# Patient Record
Sex: Female | Born: 1950
Health system: Southern US, Community
[De-identification: ages and names within clinical notes are randomized; demographics above are authoritative.]

## PROBLEM LIST (undated history)

## (undated) DIAGNOSIS — J439 Emphysema, unspecified: Secondary | ICD-10-CM

## (undated) DIAGNOSIS — R519 Headache, unspecified: Secondary | ICD-10-CM

## (undated) DIAGNOSIS — I219 Acute myocardial infarction, unspecified: Secondary | ICD-10-CM

## (undated) DIAGNOSIS — J984 Other disorders of lung: Secondary | ICD-10-CM

## (undated) DIAGNOSIS — G709 Myoneural disorder, unspecified: Secondary | ICD-10-CM

## (undated) DIAGNOSIS — K219 Gastro-esophageal reflux disease without esophagitis: Secondary | ICD-10-CM

## (undated) DIAGNOSIS — M1990 Unspecified osteoarthritis, unspecified site: Secondary | ICD-10-CM

## (undated) DIAGNOSIS — M479 Spondylosis, unspecified: Secondary | ICD-10-CM

## (undated) DIAGNOSIS — R05 Cough: Secondary | ICD-10-CM

## (undated) DIAGNOSIS — Z8739 Personal history of other diseases of the musculoskeletal system and connective tissue: Secondary | ICD-10-CM

## (undated) DIAGNOSIS — R0902 Hypoxemia: Secondary | ICD-10-CM

## (undated) DIAGNOSIS — I2510 Atherosclerotic heart disease of native coronary artery without angina pectoris: Secondary | ICD-10-CM

## (undated) DIAGNOSIS — H269 Unspecified cataract: Secondary | ICD-10-CM

## (undated) DIAGNOSIS — G4730 Sleep apnea, unspecified: Secondary | ICD-10-CM

## (undated) DIAGNOSIS — M810 Age-related osteoporosis without current pathological fracture: Secondary | ICD-10-CM

## (undated) DIAGNOSIS — I4719 Other supraventricular tachycardia: Secondary | ICD-10-CM

## (undated) DIAGNOSIS — R51 Headache: Secondary | ICD-10-CM

## (undated) DIAGNOSIS — I499 Cardiac arrhythmia, unspecified: Secondary | ICD-10-CM

## (undated) DIAGNOSIS — M5030 Other cervical disc degeneration, unspecified cervical region: Secondary | ICD-10-CM

## (undated) DIAGNOSIS — I471 Supraventricular tachycardia: Secondary | ICD-10-CM

## (undated) DIAGNOSIS — R059 Cough, unspecified: Secondary | ICD-10-CM

## (undated) DIAGNOSIS — F410 Panic disorder [episodic paroxysmal anxiety] without agoraphobia: Secondary | ICD-10-CM

## (undated) DIAGNOSIS — J449 Chronic obstructive pulmonary disease, unspecified: Secondary | ICD-10-CM

## (undated) DIAGNOSIS — IMO0001 Reserved for inherently not codable concepts without codable children: Secondary | ICD-10-CM

## (undated) DIAGNOSIS — R7303 Prediabetes: Secondary | ICD-10-CM

## (undated) HISTORY — PX: SPINE SURGERY: SHX786

## (undated) HISTORY — PX: TUBAL LIGATION: SHX77

## (undated) HISTORY — DX: Unspecified osteoarthritis, unspecified site: M19.90

## (undated) HISTORY — DX: Hypoxemia: R09.02

## (undated) HISTORY — PX: TONSILLECTOMY: SUR1361

## (undated) HISTORY — DX: Myoneural disorder, unspecified: G70.9

## (undated) HISTORY — DX: Other supraventricular tachycardia: I47.19

## (undated) HISTORY — DX: Unspecified cataract: H26.9

## (undated) HISTORY — DX: Supraventricular tachycardia: I47.1

## (undated) HISTORY — PX: EYE SURGERY: SHX253

## (undated) HISTORY — DX: Atherosclerotic heart disease of native coronary artery without angina pectoris: I25.10

## (undated) HISTORY — DX: Chronic obstructive pulmonary disease, unspecified: J44.9

---

## 1988-12-26 HISTORY — PX: BREAST ENHANCEMENT SURGERY: SHX7

## 1993-12-26 HISTORY — PX: CARDIAC CATHETERIZATION: SHX172

## 1998-09-28 ENCOUNTER — Inpatient Hospital Stay (HOSPITAL_COMMUNITY): Admission: EM | Admit: 1998-09-28 | Discharge: 1998-09-29 | Payer: Self-pay | Admitting: Cardiovascular Disease

## 2002-10-01 ENCOUNTER — Emergency Department (HOSPITAL_COMMUNITY): Admission: EM | Admit: 2002-10-01 | Discharge: 2002-10-01 | Payer: Self-pay | Admitting: Emergency Medicine

## 2005-05-30 ENCOUNTER — Ambulatory Visit (HOSPITAL_COMMUNITY): Admission: RE | Admit: 2005-05-30 | Discharge: 2005-05-30 | Payer: Self-pay | Admitting: Family Medicine

## 2005-06-20 ENCOUNTER — Emergency Department (HOSPITAL_COMMUNITY): Admission: EM | Admit: 2005-06-20 | Discharge: 2005-06-20 | Payer: Self-pay | Admitting: Emergency Medicine

## 2005-07-13 ENCOUNTER — Ambulatory Visit (HOSPITAL_COMMUNITY): Admission: RE | Admit: 2005-07-13 | Discharge: 2005-07-13 | Payer: Self-pay | Admitting: *Deleted

## 2011-09-22 ENCOUNTER — Encounter: Payer: Self-pay | Admitting: Pulmonary Disease

## 2011-09-24 ENCOUNTER — Encounter (HOSPITAL_COMMUNITY): Payer: Self-pay | Admitting: *Deleted

## 2011-09-24 ENCOUNTER — Other Ambulatory Visit: Payer: Self-pay

## 2011-09-24 ENCOUNTER — Emergency Department (HOSPITAL_COMMUNITY)
Admission: EM | Admit: 2011-09-24 | Discharge: 2011-09-24 | Disposition: A | Discharge status: Home / Self Care | Payer: Medicaid Other | Attending: Emergency Medicine | Admitting: Emergency Medicine

## 2011-09-24 DIAGNOSIS — F17200 Nicotine dependence, unspecified, uncomplicated: Secondary | ICD-10-CM | POA: Insufficient documentation

## 2011-09-24 DIAGNOSIS — T7840XA Allergy, unspecified, initial encounter: Secondary | ICD-10-CM

## 2011-09-24 DIAGNOSIS — J4489 Other specified chronic obstructive pulmonary disease: Secondary | ICD-10-CM | POA: Insufficient documentation

## 2011-09-24 DIAGNOSIS — R21 Rash and other nonspecific skin eruption: Secondary | ICD-10-CM | POA: Insufficient documentation

## 2011-09-24 DIAGNOSIS — J449 Chronic obstructive pulmonary disease, unspecified: Secondary | ICD-10-CM | POA: Insufficient documentation

## 2011-09-24 DIAGNOSIS — T783XXA Angioneurotic edema, initial encounter: Secondary | ICD-10-CM | POA: Insufficient documentation

## 2011-09-24 DIAGNOSIS — R220 Localized swelling, mass and lump, head: Secondary | ICD-10-CM | POA: Insufficient documentation

## 2011-09-24 DIAGNOSIS — R079 Chest pain, unspecified: Secondary | ICD-10-CM | POA: Insufficient documentation

## 2011-09-24 DIAGNOSIS — R221 Localized swelling, mass and lump, neck: Secondary | ICD-10-CM | POA: Insufficient documentation

## 2011-09-24 DIAGNOSIS — T486X5A Adverse effect of antiasthmatics, initial encounter: Secondary | ICD-10-CM | POA: Insufficient documentation

## 2011-09-24 MED ORDER — DIPHENHYDRAMINE HCL 50 MG/ML IJ SOLN
INTRAMUSCULAR | Status: AC
  Administered 2011-09-24: 25 mg
  Filled 2011-09-24: qty 1

## 2011-09-24 MED ORDER — FAMOTIDINE IN NACL 20-0.9 MG/50ML-% IV SOLN
INTRAVENOUS | Status: AC
  Administered 2011-09-24: 20 mg
  Filled 2011-09-24: qty 50

## 2011-09-24 MED ORDER — METHYLPREDNISOLONE SODIUM SUCC 125 MG IJ SOLR
INTRAMUSCULAR | Status: AC
  Filled 2011-09-24: qty 2

## 2011-09-24 NOTE — ED Provider Notes (Addendum)
History     CSN: [id number] Arrival date & time: 09/24/2011  4:22 AM  Chief Complaint  Patient presents with  . Shortness of Breath  . Rash  . Oral Swelling  . Chest Pain    (Consider location/radiation/quality/duration/timing/severity/associated sxs/prior treatment) HPI Comments: Seen 0459.   Patient is a 60 y.o. female presenting with allergic reaction. The history is provided by the patient and a relative.  Allergic Reaction The primary symptoms are  shortness of breath, rash and angioedema. Primary symptoms comment: Per patient she developed tongue swelling at 6:30 PM yesterday. Took benadryl. Woke at 2 AM with both tongue swelling and itching and rash to hands which spread to entire body. Took additional benadryl. The current episode started 6 to 12 hours ago. The problem has been gradually improving. This is a new problem.  The rash is associated with itching.  The angioedema began 6 to 12 hours ago. The angioedema has been gradually improving since its onset. It is located on the tongue. The angioedema is associated with shortness of breath.  Associated with: no known source. Started a new albuterol inhaler however has been taking albuterol for years. Significant symptoms also include itching.    Past Medical History  Diagnosis Date  . COPD (chronic obstructive pulmonary disease)     Past Surgical History  Procedure Date  . Tonsillectomy   . Tonsillectomy     History reviewed. No pertinent family history.  History  Substance Use Topics  . Smoking status: Current Everyday Smoker  . Smokeless tobacco: Not on file  . Alcohol Use: No    OB History    Grav Para Term Preterm Abortions TAB SAB Ect Mult Living                  Review of Systems  HENT:       Tongue swelling  Respiratory: Positive for shortness of breath.   Skin: Positive for itching and rash.  All other systems reviewed and are negative.    Allergies  Prednisone  Home Medications    Current Outpatient Rx  Name Route Sig Dispense Refill  . ALBUTEROL SULFATE HFA 108 (90 BASE) MCG/ACT IN AERS Inhalation Inhale 2 puffs into the lungs every 6 (six) hours as needed.        BP 105/63  Pulse 108  Temp(Src) 97.7 F (36.5 C) (Oral)  SpO2 93%  Physical Exam  Nursing note and vitals reviewed. Constitutional: She is oriented to person, place, and time. She appears well-developed and well-nourished. No distress.  HENT:  Head: Atraumatic.  Right Ear: External ear normal.  Left Ear: External ear normal.  Nose: Nose normal.  Mouth/Throat: Oropharynx is clear and moist.       Tongue is normal size  Eyes: EOM are normal. Pupils are equal, round, and reactive to light.  Neck: Normal range of motion. Neck supple.       No stidor  Cardiovascular: Normal rate, normal heart sounds and intact distal pulses.   Pulmonary/Chest: Effort normal. She has wheezes.       Occasional end expiratory wheeze  Abdominal: Soft. Bowel sounds are normal.  Musculoskeletal: Normal range of motion.  Neurological: She is alert and oriented to person, place, and time. She has normal reflexes.  Skin: Skin is warm and dry.    ED Course  CRITICAL CARE Performed by: Annamarie Dawley Authorized by: Annamarie Dawley Critical care was necessary to treat or prevent imminent or life-threatening deterioration of the following  conditions: respiratory failure. Critical care was time spent personally by me on the following activities: development of treatment plan with patient or surrogate, evaluation of patient's response to treatment, examination of patient, obtaining history from patient or surrogate, ordering and performing treatments and interventions, pulse oximetry and re-evaluation of patient's condition.   Critical Care time 40 minutes   Date: 09/24/2011  0433  Rate: 101  Rhythm: sinus tachycardia  QRS Axis: normal  Intervals: normal  ST/T Wave abnormalities: normal  Conduction  Disutrbances:none  Narrative Interpretation:   Old EKG Reviewed: unchanged from 09/28/98, anterior infarct noted at that time  Patient with onset of angioedema and no known exposures. Developed tongue swelling and rash to hands and feet. Took benadryl at home with some relief.Was given both benadryl and pepcid in the ER with resolution of both swelling and rash. Patient is allergic to prednisone. Discussed with patient need to return to the ER if develops renewed angioedema. Observed, given IVF, took PO fluids. No difficulty swallowing. Pt feels improved after observation and/or treatment in ED. MDM Reviewed: nursing note and vitals Total time providing critical care: 40.           Nicoletta Dress. Colon Branch, MD 09/24/11 1610  Nicoletta Dress. Colon Branch, MD 10/02/11 2047  Nicoletta Dress. Colon Branch, MD 02/11/12 [phone]

## 2011-09-24 NOTE — ED Notes (Signed)
Pt states she felt like her tongue was swelling yesterday and took a benadryl. Pt states she woke up tonight with a red rash all over her body, tongue swelling, sob, and chest pain.

## 2011-09-28 ENCOUNTER — Ambulatory Visit (INDEPENDENT_AMBULATORY_CARE_PROVIDER_SITE_OTHER): Payer: Medicaid Other | Admitting: Pulmonary Disease

## 2011-09-28 ENCOUNTER — Encounter: Payer: Self-pay | Admitting: Pulmonary Disease

## 2011-09-28 VITALS — BP 102/66 | HR 79 | Temp 98.0°F | Ht 62.0 in | Wt 127.8 lb

## 2011-09-28 DIAGNOSIS — J449 Chronic obstructive pulmonary disease, unspecified: Secondary | ICD-10-CM

## 2011-09-28 DIAGNOSIS — R0609 Other forms of dyspnea: Secondary | ICD-10-CM | POA: Insufficient documentation

## 2011-09-28 NOTE — Patient Instructions (Signed)
Spiriva one inhalation each am  symbicort 160/4.5  2 inhalations am and pm everyday.  Keep mouth rinsed well.  Can use albuterol for rescue only You must stop smoking if you want to get better.  followup with me in 6weeks.

## 2011-09-28 NOTE — Assessment & Plan Note (Signed)
The patient has severe air flow obstruction on her spirometry today, but it is unclear how much of this is fixed versus potentially reversible if she was to quit smoking and get on adequate bronchodilator regimen.  I've had a long discussion with the patient about COPD, and how she must quit smoking if she wishes to improve.  I would like to start her on an aggressive bronchodilator regimen, and see how she responds.

## 2011-09-28 NOTE — Progress Notes (Signed)
  Subjective:    Patient ID: Taylor Burnett, female    DOB: 09-Feb-1951, 60 y.o.   MRN: [id number]  HPI The patient is a 60 year old female who I have been asked to see for dyspnea on exertion.  Patient states she has had shortness of breath for years, but believes that it is getting worse.  She describes a less than one block dyspnea on exertion at a moderate pace on flat ground.  She will also get winded bringing groceries in from the car, or doing her housework.  The patient has a long history of smoking, and unfortunately continues to do so.  She has apparently had an evaluation at the disability office which includes a chest x-ray and spirometry, however these results are not available to [place].  The patient has intermittent cough, and produces white foamy mucus primarily in the mornings.  She denies significant lower extremity edema.  She has been tried on Advair in the distant past, and felt she could not tolerate.  She has not been on any kind of maintenance medications due to the cost.   Review of Systems  Constitutional: Negative for fever and unexpected weight change.  HENT: Positive for congestion, sore throat and trouble swallowing. Negative for ear pain, nosebleeds, rhinorrhea, sneezing, dental problem, postnasal drip and sinus pressure.   Eyes: Negative for redness and itching.  Respiratory: Positive for cough and shortness of breath. Negative for chest tightness and wheezing.   Cardiovascular: Positive for chest pain and leg swelling. Negative for palpitations.  Gastrointestinal: Negative for nausea and vomiting.  Genitourinary: Negative for dysuria.  Musculoskeletal: Positive for joint swelling.  Skin: Positive for rash.  Neurological: Negative for headaches.  Hematological: Does not bruise/bleed easily.  Psychiatric/Behavioral: Positive for dysphoric mood. The patient is nervous/anxious.        Objective:   Physical Exam Constitutional:  Well developed, no acute  distress  HENT:  Nares patent without discharge, but narrowed.  Oropharynx without exudate, palate and uvula are normal  Eyes:  Perrla, eomi, no scleral icterus  Neck:  No JVD, no TMG  Cardiovascular:  Normal rate, regular rhythm, no rubs or gallops.  No murmurs        Intact distal pulses  Pulmonary :  Normal breath sounds, no stridor or respiratory distress   No rales, rhonchi, or wheezing  Abdominal:  Soft, nondistended, bowel sounds present.  No tenderness noted.   Musculoskeletal:  No lower extremity edema noted.  Lymph Nodes:  No cervical lymphadenopathy noted  Skin:  No cyanosis noted  Neurologic:  Alert, appropriate, moves all 4 extremities without obvious deficit.         Assessment & Plan:

## 2011-11-09 ENCOUNTER — Ambulatory Visit: Payer: Medicaid Other | Admitting: Pulmonary Disease

## 2011-11-14 ENCOUNTER — Ambulatory Visit (INDEPENDENT_AMBULATORY_CARE_PROVIDER_SITE_OTHER): Payer: Medicaid Other | Admitting: Pulmonary Disease

## 2011-11-14 ENCOUNTER — Encounter: Payer: Self-pay | Admitting: Pulmonary Disease

## 2011-11-14 VITALS — BP 112/70 | HR 80 | Temp 97.8°F | Ht 62.0 in | Wt 130.6 lb

## 2011-11-14 DIAGNOSIS — J449 Chronic obstructive pulmonary disease, unspecified: Secondary | ICD-10-CM

## 2011-11-14 MED ORDER — BUDESONIDE-FORMOTEROL FUMARATE 160-4.5 MCG/ACT IN AERO: 2.0000 | INHALATION_SPRAY | Freq: Two times a day (BID) | RESPIRATORY_TRACT | Status: DC

## 2011-11-14 NOTE — Progress Notes (Signed)
Addended by: Salli Quarry on: 11/14/2011 04:18 PM   Modules accepted: Orders

## 2011-11-14 NOTE — Patient Instructions (Signed)
Stay on symbicort 2 puffs am and pm.  Keep mouth rinsed well. Stop smoking.  This is the key to you getting better. followup with me in 6mos.

## 2011-11-14 NOTE — Progress Notes (Signed)
  Subjective:    Patient ID: Taylor Burnett, female    DOB: March 13, 1951, 60 y.o.   MRN: [id number]  HPI The patient comes in today for followup of her known severe obstructive lung disease.  Some of this is felt to be reversible from airway inflammation associated with ongoing smoking.  At the last visit, the patient was started on Spiriva as well as symbicort, and asked to followup today.  She was unable to stay on Spiriva due to "mouth burning", and although she can stay on the symbicort, she feels this makes her anxious.  She has seen some improvement in her breathing since being on these medications.  Unfortunately she continues to smoke.   Review of Systems  Constitutional: Negative for fever and unexpected weight change.  HENT: Negative for ear pain, nosebleeds, congestion, sore throat, rhinorrhea, sneezing, trouble swallowing, dental problem, postnasal drip and sinus pressure.   Eyes: Positive for itching. Negative for redness.  Respiratory: Positive for cough, chest tightness, shortness of breath and wheezing.   Cardiovascular: Positive for palpitations. Negative for leg swelling.  Gastrointestinal: Negative for nausea and vomiting.  Genitourinary: Negative for dysuria.  Musculoskeletal: Negative for joint swelling.  Skin: Negative for rash.  Neurological: Negative for headaches.  Hematological: Does not bruise/bleed easily.  Psychiatric/Behavioral: Positive for dysphoric mood. The patient is nervous/anxious.        Objective:   Physical Exam Well-developed female in no acute distress Nose without purulence or discharge noted Chest with decreased breath sounds, but no wheezes or rhonchi Heart regular rate and rhythm Lower extremities without edema, no cyanosis noted Alert and oriented, moves all 4 extremities.       Assessment & Plan:

## 2011-11-14 NOTE — Assessment & Plan Note (Signed)
The pt feels she cannot tolerate spiriva due to side effects, but can live with the inconvenience of symbicort.  I have asked her to continue on this medication, and most importantly needs to stop smoking.  She will followup with me in 6mos, but call if having increased breathing issues.

## 2011-12-19 ENCOUNTER — Inpatient Hospital Stay (HOSPITAL_COMMUNITY)
Admission: EM | Admit: 2011-12-19 | Discharge: 2011-12-23 | DRG: 153 | Disposition: A | Discharge status: Home / Self Care | Payer: Medicaid Other | Attending: Internal Medicine | Admitting: Internal Medicine

## 2011-12-19 ENCOUNTER — Encounter (HOSPITAL_COMMUNITY): Payer: Self-pay | Admitting: *Deleted

## 2011-12-19 ENCOUNTER — Emergency Department (HOSPITAL_COMMUNITY): Payer: Medicaid Other

## 2011-12-19 ENCOUNTER — Telehealth: Payer: Self-pay | Admitting: Pulmonary Disease

## 2011-12-19 DIAGNOSIS — J441 Chronic obstructive pulmonary disease with (acute) exacerbation: Secondary | ICD-10-CM | POA: Diagnosis present

## 2011-12-19 DIAGNOSIS — R0902 Hypoxemia: Secondary | ICD-10-CM | POA: Diagnosis present

## 2011-12-19 DIAGNOSIS — F17200 Nicotine dependence, unspecified, uncomplicated: Secondary | ICD-10-CM | POA: Diagnosis present

## 2011-12-19 DIAGNOSIS — Z7982 Long term (current) use of aspirin: Secondary | ICD-10-CM

## 2011-12-19 DIAGNOSIS — J111 Influenza due to unidentified influenza virus with other respiratory manifestations: Principal | ICD-10-CM | POA: Diagnosis present

## 2011-12-19 DIAGNOSIS — J449 Chronic obstructive pulmonary disease, unspecified: Secondary | ICD-10-CM | POA: Diagnosis present

## 2011-12-19 DIAGNOSIS — D696 Thrombocytopenia, unspecified: Secondary | ICD-10-CM

## 2011-12-19 DIAGNOSIS — D709 Neutropenia, unspecified: Secondary | ICD-10-CM | POA: Diagnosis not present

## 2011-12-19 DIAGNOSIS — R197 Diarrhea, unspecified: Secondary | ICD-10-CM | POA: Diagnosis present

## 2011-12-19 LAB — BASIC METABOLIC PANEL
Creatinine, Ser: 0.8 mg/dL (ref 0.50–1.10)
GFR calc Af Amer: >90 mL/min (ref >90)
Glucose, Bld: 129 mg/dL — ABNORMAL HIGH (ref 70–99)
Potassium: 3.8 mEq/L (ref 3.5–5.1)
Sodium: 134 mEq/L — ABNORMAL LOW (ref 135–145)

## 2011-12-19 LAB — DIFFERENTIAL
Basophils Relative: 0 % (ref 0–1)
Eosinophils Absolute: 0 10*3/uL (ref 0.0–0.7)
Eosinophils Relative: 0 % (ref 0–5)
Lymphocytes Relative: 19 % (ref 12–46)
Lymphs Abs: 1.3 10*3/uL (ref 0.7–4.0)
Monocytes Relative: 8 % (ref 3–12)

## 2011-12-19 LAB — CBC
MCHC: 33.9 g/dL (ref 30.0–36.0)
RDW: 13 % (ref 11.5–15.5)
WBC: 6.7 10*3/uL (ref 4.0–10.5)

## 2011-12-19 MED ORDER — SODIUM CHLORIDE 0.9 % IV SOLN
INTRAVENOUS | Status: DC
  Administered 2011-12-19: 22:00:00 via INTRAVENOUS

## 2011-12-19 MED ORDER — IPRATROPIUM BROMIDE 0.02 % IN SOLN
1.0000 mg | Freq: Once | RESPIRATORY_TRACT | Status: AC
  Administered 2011-12-19: 1 mg via RESPIRATORY_TRACT
  Filled 2011-12-19: qty 2.5

## 2011-12-19 MED ORDER — IPRATROPIUM BROMIDE 0.02 % IN SOLN: 0.5000 mg | Freq: Once | RESPIRATORY_TRACT | Status: DC

## 2011-12-19 MED ORDER — ALBUTEROL SULFATE (5 MG/ML) 0.5% IN NEBU
5.0000 mg | INHALATION_SOLUTION | Freq: Once | RESPIRATORY_TRACT | Status: AC
  Administered 2011-12-19: 5 mg via RESPIRATORY_TRACT
  Filled 2011-12-19: qty 1

## 2011-12-19 MED ORDER — IPRATROPIUM BROMIDE 0.02 % IN SOLN: 1.0000 mg | Freq: Once | RESPIRATORY_TRACT | Status: DC

## 2011-12-19 MED ORDER — ACETAMINOPHEN 325 MG PO TABS
650.0000 mg | ORAL_TABLET | Freq: Once | ORAL | Status: AC
  Administered 2011-12-19: 650 mg via ORAL
  Filled 2011-12-19: qty 2

## 2011-12-19 MED ORDER — IPRATROPIUM BROMIDE 0.02 % IN SOLN
0.5000 mg | Freq: Once | RESPIRATORY_TRACT | Status: AC
  Administered 2011-12-19: 0.5 mg via RESPIRATORY_TRACT
  Filled 2011-12-19: qty 2.5

## 2011-12-19 MED ORDER — ALBUTEROL SULFATE (5 MG/ML) 0.5% IN NEBU: 10.0000 mg | INHALATION_SOLUTION | Freq: Once | RESPIRATORY_TRACT | Status: DC

## 2011-12-19 MED ORDER — ALBUTEROL (5 MG/ML) CONTINUOUS INHALATION SOLN
10.0000 mg/h | INHALATION_SOLUTION | Freq: Once | RESPIRATORY_TRACT | Status: AC
  Administered 2011-12-19: 10 mg/h via RESPIRATORY_TRACT
  Filled 2011-12-19 (×2): qty 20

## 2011-12-19 MED ORDER — SODIUM CHLORIDE 0.9 % IV BOLUS (SEPSIS)
1000.0000 mL | Freq: Once | INTRAVENOUS | Status: AC
  Administered 2011-12-19: 1000 mL via INTRAVENOUS

## 2011-12-19 NOTE — H&P (Signed)
PCP: None  Chief Complaint: Fever, chills, and shortness of breath.   HPI: Taylor Burnett is an 60 y.o. female with history of COPD, active tobacco smoker, presents to [facility] emergency room with two-day satiety of feeling malaise, chills, nonproductive cough, myalgia, abdominal cramps and diarrhea. Her daughter also had similar complaints. She denied any distant travel. Other than emphysema which she uses Symbicort regularly, she's not on any other medication. She said she has history of coronary disease, and prior MI, with prior negative cardiac catheterization. She did not have her flu shot this year. She to smoke about 5 cigarettes per day, but has had significant prior tobacco use of up to 2 packs per day.  In the emergency room, evaluation included a chest x-ray which showed no infiltrate. She had received several nebulizer treatments, but continued to be short of breath.  She also has slight hypoxemia with O2 sat of 87% requiring supplemental oxygen. Hospitalist was last asked to admit her for further treatment of her COPD exacerbation and likely influenza.  She also admitted to have an watery diarrhea for the past couple of days.  Rewiew of Systems:  The patient denies anorexia,, weight loss,, vision loss, decreased hearing, hoarseness, chest pain, syncope,  peripheral edema, balance deficits, hemoptysis, abdominal pain, melena, hematochezia, severe indigestion/heartburn, hematuria, incontinence, genital sores, muscle weakness, suspicious skin lesions, transient blindness, difficulty walking, depression, unusual weight change, abnormal bleeding, enlarged lymph nodes, angioedema, and breast masses   Past Medical History  Diagnosis Date  . COPD (chronic obstructive pulmonary disease)     Past Surgical History  Procedure Date  . Tonsillectomy   . Tubal ligation     Medications:  HOME MEDS: Prior to Admission medications   Medication Sig Start Date End Date Taking? Authorizing  Provider  acetaminophen (TYLENOL) 500 MG tablet Take 1,000 mg by mouth every 6 (six) hours as needed. For pain/fever.    Yes Historical Provider, MD  albuterol (VENTOLIN HFA) 108 (90 BASE) MCG/ACT inhaler Inhale 2 puffs into the lungs every 6 (six) hours as needed.     Yes Historical Provider, MD  aspirin EC 81 MG tablet Take 81 mg by mouth daily.     Yes Historical Provider, MD  budesonide-formoterol (SYMBICORT) 160-4.5 MCG/ACT inhaler Inhale 2 puffs into the lungs 2 (two) times daily. 11/14/11  Yes Barbaraann Share, MD  dextromethorphan (DELSYM) 30 MG/5ML liquid Take 60 mg by mouth 2 (two) times daily.     Yes Historical Provider, MD     Allergies:  Allergies  Allergen Reactions  . Prednisone     Depression and suicidal    Social History:   reports that she has been smoking Cigarettes.  She has a 80 pack-year smoking history. She does not have any smokeless tobacco history on file. She reports that she does not drink alcohol or use illicit drugs. 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 She lives alone, has 3 children and 4 grandchildren. Family History: Family History  Problem Relation Age of Onset  . Emphysema Father   . Allergies Mother   . Allergies Son   . Allergies Brother   . Heart disease Maternal Grandmother   . Rheum arthritis Brother   . Cancer      aunts and uncles     Physical Exam: Filed Vitals:   12/19/11 2045 12/19/11 2119 12/19/11 2245 12/19/11 2309  BP: 100/53 108/50 94/42 92/54   Pulse: 108 104 132 129  Temp: 101  F (38.3 C)  98.9 F (37.2 C)   TempSrc: Oral  Oral   Resp: 24  28 20   SpO2: 86% 100% 91% 96%   Blood pressure 92/54, pulse 129, temperature 98.9 F (37.2 C), temperature source Oral, resp. rate 20, SpO2 96.00%.  GEN:  Pleasant  person lying in the stretcher in no acute distress; cooperative with exam PSYCH:  alert and oriented x4; does not appear anxious does not appear depressed; affect is normal HEENT: Mucous membranes  pink and anicteric; PERRLA; EOM intact; no cervical lymphadenopathy nor thyromegaly or carotid bruit; no JVD; Breasts:: Not examined CHEST WALL: No tenderness CHEST: Normal respiration, she has scattered rhonchi throughout. There are  some inspiratory wheezes. HEART: Regular rate and rhythm; no murmurs rubs or gallops BACK: No kyphosis or scoliosis; no CVA tenderness ABDOMEN: Obese, soft non-tender; no masses, no organomegaly, normal abdominal bowel sounds; no pannus; no intertriginous candida. Rectal Exam: Not done EXTREMITIES: No bone or joint deformity; age-appropriate arthropathy of the hands and knees; no edema; no ulcerations. Genitalia: not examined PULSES: 2+ and symmetric SKIN: Normal hydration no rash or ulceration CNS: Cranial nerves 2-12 grossly intact no focal neurologic deficit   Labs & Imaging Results for orders placed during the hospital encounter of 12/19/11 (from the past 48 hour(s))  CBC     Status: Abnormal   Collection Time   12/19/11  8:45 PM      Component Value Range Comment   WBC 6.7  4.0 - 10.5 (K/uL)    RBC 4.86  3.87 - 5.11 (MIL/uL)    Hemoglobin 15.4 (*) 12.0 - 15.0 (g/dL)    HCT 14.7  82.9 - 56.2 (%)    MCV 93.4  78.0 - 100.0 (fL)    MCH 31.7  26.0 - 34.0 (pg)    MCHC 33.9  30.0 - 36.0 (g/dL)    RDW 13.0  86.5 - 78.4 (%)    Platelets 105 (*) 150 - 400 (K/uL) PLATELET COUNT CONFIRMED BY SMEAR  DIFFERENTIAL     Status: Normal   Collection Time   12/19/11  8:45 PM      Component Value Range Comment   Neutrophils Relative 73  43 - 77 (%)    Lymphocytes Relative 19  12 - 46 (%)    Monocytes Relative 8  3 - 12 (%)    Eosinophils Relative 0  0 - 5 (%)    Basophils Relative 0  0 - 1 (%)    Neutro Abs 4.9  1.7 - 7.7 (K/uL)    Lymphs Abs 1.3  0.7 - 4.0 (K/uL)    Monocytes Absolute 0.5  0.1 - 1.0 (K/uL)    Eosinophils Absolute 0.0  0.0 - 0.7 (K/uL)    Basophils Absolute 0.0  0.0 - 0.1 (K/uL)    Smear Review MORPHOLOGY UNREMARKABLE     BASIC METABOLIC  PANEL     Status: Abnormal   Collection Time   12/19/11  8:45 PM      Component Value Range Comment   Sodium 134 (*) 135 - 145 (mEq/L)    Potassium 3.8  3.5 - 5.1 (mEq/L)    Chloride 96  96 - 112 (mEq/L)    CO2 29  19 - 32 (mEq/L)    Glucose, Bld 129 (*) 70 - 99 (mg/dL)    BUN 7  6 - 23 (mg/dL)    Creatinine, Ser 6.96  0.50 - 1.10 (mg/dL)    Calcium 9.2  8.4 - 10.5 (mg/dL)  GFR calc non Af Amer 79 (*) >90 (mL/min)    GFR calc Af Amer >90  >90 (mL/min)    Dg Chest 2 View  12/19/2011  *RADIOLOGY REPORT*  Clinical Data: Shortness of breath, productive cough, fever  CHEST - 2 VIEW  Comparison: Chest x-ray of 06/20/2005  Findings: The lungs are clear but hyperaerated consistent with COPD.  No infiltrate or effusion is seen. Mediastinal contours appear normal.  The heart is within normal limits in size.  No acute bony abnormality is seen.  Calcification of the right breast implant is noted.  IMPRESSION:  COPD.  No active lung disease.  Original Report Authenticated By: Juline Patch, M.D.      Assessment Present on Admission:  .Influenza .COPD (chronic obstructive pulmonary disease) .Hypoxemia   PLAN:  She likely has the influenza virus. She also has severe COPD with exacerbation, now requiring supplemental O2. Will admit her to the general medical floor and give her 2 L of nasal cannula. I will test her for the influenza virus, and start her on Tamiflu 75 mg twice a day for 10 days. We'll treat her symptoms. She will need intravenous fluid also. She was not started on antibiotic, and thus I do not think that this is C. difficile. I did not order stool study, as the viral syndrome seems lately has included diarrhea as a symptom. If it persists, please send stool for studies. She is stable, full code, and will be admitted to [facility].   Other plans as per orders.   Kashara Blocher 12/19/2011, 11:38 PM

## 2011-12-19 NOTE — ED Provider Notes (Signed)
History     CSN: [id number]  Arrival date & time 12/19/11  1722   First MD Initiated Contact with Patient 12/19/11 1757      Chief Complaint  Patient presents with  . Influenza    (Consider location/radiation/quality/duration/timing/severity/associated sxs/prior treatment) HPI Comments: Patient reports that she has had sore throat, headache, body aches, increased productive cough, and fever for the past 2 days.  She has had an increased productive cough and increased shortness of breath.  She is unsure how high her fever has been because she does not have a thermometer.  PMH significant for COPD.  She reports that she currently smokes 5 cigarettes a day, but in the past has smoked 2ppd for 40 years.  She has also had diarrhea for 2-3 days.  She has had approximately 10 episodes of diarrhea per day.  No blood in her stool.  Her daughter has similar symptoms and has been diagnosed with Influenza.   Patient is a 60 y.o. female presenting with fever. The history is provided by the patient.  Fever Primary symptoms of the febrile illness include fever, fatigue, headaches, cough, shortness of breath, diarrhea and myalgias. Primary symptoms do not include wheezing, abdominal pain, nausea, vomiting, dysuria or rash. Episode onset: Three days ago. This is a new problem.  The headache is not associated with neck stiffness.    Past Medical History  Diagnosis Date  . COPD (chronic obstructive pulmonary disease)     Past Surgical History  Procedure Date  . Tonsillectomy   . Tubal ligation     Family History  Problem Relation Age of Onset  . Emphysema Father   . Allergies Mother   . Allergies Son   . Allergies Brother   . Heart disease Maternal Grandmother   . Rheum arthritis Brother   . Cancer      aunts and uncles    History  Substance Use Topics  . Smoking status: Current Everyday Smoker -- 2.0 packs/day for 40 years    Types: Cigarettes  . Smokeless tobacco: Not on file   Comment: currently smoking 5 cigs daily.  . Alcohol Use: No    OB History    Grav Para Term Preterm Abortions TAB SAB Ect Mult Living                  Review of Systems  Constitutional: Positive for fever, chills and fatigue.  HENT: Positive for sore throat and rhinorrhea. Negative for ear pain, congestion, neck pain, neck stiffness and sinus pressure.   Respiratory: Positive for cough and shortness of breath. Negative for chest tightness and wheezing.   Gastrointestinal: Positive for diarrhea. Negative for nausea, vomiting, abdominal pain, blood in stool and abdominal distention.  Genitourinary: Negative for dysuria.  Musculoskeletal: Positive for myalgias.  Skin: Negative for rash.  Neurological: Positive for headaches. Negative for dizziness, syncope and light-headedness.  Psychiatric/Behavioral: Negative for confusion.    Allergies  Prednisone  Home Medications   Current Outpatient Rx  Name Route Sig Dispense Refill  . ACETAMINOPHEN 500 MG PO TABS Oral Take 1,000 mg by mouth every 6 (six) hours as needed. For pain/fever.     . ALBUTEROL SULFATE HFA 108 (90 BASE) MCG/ACT IN AERS Inhalation Inhale 2 puffs into the lungs every 6 (six) hours as needed.      . ASPIRIN EC 81 MG PO TBEC Oral Take 81 mg by mouth daily.      . BUDESONIDE-FORMOTEROL FUMARATE 160-4.5 MCG/ACT IN AERO Inhalation Inhale  2 puffs into the lungs 2 (two) times daily. 1 Inhaler 6  . DEXTROMETHORPHAN POLISTIREX ER 30 MG/5ML PO LQCR Oral Take 60 mg by mouth 2 (two) times daily.        BP 131/61  Pulse 96  Temp(Src) 100.3 F (37.9 C) (Oral)  Resp 22  SpO2 89%  Physical Exam  Nursing note and vitals reviewed. Constitutional: She is oriented to person, place, and time. She appears well-developed and well-nourished. No distress.  HENT:  Head: Normocephalic and atraumatic.  Mouth/Throat: Oropharynx is clear and moist.  Eyes: EOM are normal. Pupils are equal, round, and reactive to light.  Neck: Normal  range of motion. Neck supple.  Cardiovascular: Normal rate, regular rhythm and normal heart sounds.   Pulmonary/Chest: Effort normal. She has decreased breath sounds. She has wheezes.       Diffuse expiratory wheezes  Abdominal: Soft. Bowel sounds are normal.  Neurological: She is alert and oriented to person, place, and time.  Skin: Skin is warm and dry. No rash noted. She is not diaphoretic.  Psychiatric: She has a normal mood and affect.    ED Course  Procedures (including critical care time)  Labs Reviewed - No data to display No results found.   No diagnosis found.  11:05 PM Discussed patient with [facility] .  Due to the fact that patient has new oxygen requirement and only minimal improvement with neb treatments, will admit patient to hospital.  MDM    See 1105 pm discussion      Doug Sou, MD 12/21/11 [phone]

## 2011-12-19 NOTE — ED Notes (Signed)
patietn placed on 2L of oxygen and saturation is up at 93%

## 2011-12-19 NOTE — ED Notes (Signed)
Breathing treatment is completed.

## 2011-12-19 NOTE — ED Provider Notes (Signed)
History     CSN: [id number]  Arrival date & time 12/19/11  1722   First MD Initiated Contact with Patient 12/19/11 1757      Chief Complaint  Patient presents with  . Influenza    (Consider location/radiation/quality/duration/timing/severity/associated sxs/prior treatment) HPI  Past Medical History  Diagnosis Date  . COPD (chronic obstructive pulmonary disease)     Past Surgical History  Procedure Date  . Tonsillectomy   . Tubal ligation     Family History  Problem Relation Age of Onset  . Emphysema Father   . Allergies Mother   . Allergies Son   . Allergies Brother   . Heart disease Maternal Grandmother   . Rheum arthritis Brother   . Cancer      aunts and uncles    History  Substance Use Topics  . Smoking status: Current Everyday Smoker -- 2.0 packs/day for 40 years    Types: Cigarettes  . Smokeless tobacco: Not on file   Comment: currently smoking 5 cigs daily.  . Alcohol Use: No    OB History    Grav Para Term Preterm Abortions TAB SAB Ect Mult Living                  Review of Systems  Allergies  Prednisone  Home Medications   Current Outpatient Rx  Name Route Sig Dispense Refill  . ACETAMINOPHEN 500 MG PO TABS Oral Take 1,000 mg by mouth every 6 (six) hours as needed. For pain/fever.     . ALBUTEROL SULFATE HFA 108 (90 BASE) MCG/ACT IN AERS Inhalation Inhale 2 puffs into the lungs every 6 (six) hours as needed.      . ASPIRIN EC 81 MG PO TBEC Oral Take 81 mg by mouth daily.      . BUDESONIDE-FORMOTEROL FUMARATE 160-4.5 MCG/ACT IN AERO Inhalation Inhale 2 puffs into the lungs 2 (two) times daily. 1 Inhaler 6  . DEXTROMETHORPHAN POLISTIREX ER 30 MG/5ML PO LQCR Oral Take 60 mg by mouth 2 (two) times daily.        BP 100/50  Pulse 94  Temp(Src) 100.2 F (37.9 C) (Oral)  Resp 18  SpO2 100%  Physical Exam  ED Course  Procedures (including critical care time)  Labs Reviewed - No data to display Dg Chest 2 View  12/19/2011   *RADIOLOGY REPORT*  Clinical Data: Shortness of breath, productive cough, fever  CHEST - 2 VIEW  Comparison: Chest x-ray of 06/20/2005  Findings: The lungs are clear but hyperaerated consistent with COPD.  No infiltrate or effusion is seen. Mediastinal contours appear normal.  The heart is within normal limits in size.  No acute bony abnormality is seen.  Calcification of the right breast implant is noted.  IMPRESSION:  COPD.  No active lung disease.  Original Report Authenticated By: Juline Patch, M.D.     No diagnosis found.    MDM  Duplicate note        Doug Sou, MD 12/21/11 [phone]

## 2011-12-19 NOTE — ED Notes (Signed)
Patient with flu like symptoms for about 3 days.  Patient also c/o diarrhea

## 2011-12-19 NOTE — ED Notes (Signed)
Family at bedside.daughter

## 2011-12-19 NOTE — Telephone Encounter (Signed)
Ms. Large daughter called.  Her mother has been running fever of 102F for past two days.  This has been associated with cough and increased shortness of breath.  She has been using her inhaler more and using delsym, but these don't help.  She has also been getting nauseous.  She has been trying to drink fluids, but is having persistent diarrhea and abdominal cramps.  I explained to the pt's daughter that her symptoms sound very severe, and that she needs to take her mother to the emergency room as soon as possible for further evaluation.

## 2011-12-19 NOTE — ED Provider Notes (Signed)
Complains of cough headache sore throat and diarrhea and shortness of breath for 3 days. Symptoms accompanied by fever. Seen by me after received one nebulized treatment. On exam mildly ill-appearing he describes dry oropharynx not red lungs expiratory wheezes , mildly prolonged expiratory phase speaks in paragraphs  Doug Sou, MD 12/19/11 2035

## 2011-12-19 NOTE — ED Notes (Signed)
Pt removed sweatshirt, sweat pants, coat, has on glasses. Purse at the bedside.

## 2011-12-20 LAB — BASIC METABOLIC PANEL
BUN: 7 mg/dL (ref 6–23)
CO2: 25 mEq/L (ref 19–32)
Chloride: 103 mEq/L (ref 96–112)
Creatinine, Ser: 0.7 mg/dL (ref 0.50–1.10)
GFR calc Af Amer: >90 mL/min (ref >90)
Glucose, Bld: 134 mg/dL — ABNORMAL HIGH (ref 70–99)
Potassium: 3.5 mEq/L (ref 3.5–5.1)

## 2011-12-20 LAB — CBC
HCT: 36.8 % (ref 36.0–46.0)
HCT: 37.1 % (ref 36.0–46.0)
Hemoglobin: 12.1 g/dL (ref 12.0–15.0)
Hemoglobin: 12.3 g/dL (ref 12.0–15.0)
MCH: 31 pg (ref 26.0–34.0)
MCHC: 32.9 g/dL (ref 30.0–36.0)
MCHC: 33.2 g/dL (ref 30.0–36.0)
MCV: 93.5 fL (ref 78.0–100.0)
MCV: 94.4 fL (ref 78.0–100.0)
RDW: 13.1 % (ref 11.5–15.5)

## 2011-12-20 MED ORDER — ONDANSETRON HCL 4 MG PO TABS: 4.0000 mg | ORAL_TABLET | Freq: Four times a day (QID) | ORAL | Status: DC | PRN

## 2011-12-20 MED ORDER — ONDANSETRON HCL 4 MG/2ML IJ SOLN: 4.0000 mg | Freq: Four times a day (QID) | INTRAMUSCULAR | Status: DC | PRN

## 2011-12-20 MED ORDER — ASPIRIN EC 81 MG PO TBEC
81.0000 mg | DELAYED_RELEASE_TABLET | Freq: Every day | ORAL | Status: DC
  Administered 2011-12-20 – 2011-12-23 (×4): 81 mg via ORAL
  Filled 2011-12-20 (×4): qty 1

## 2011-12-20 MED ORDER — KCL IN DEXTROSE-NACL 20-5-0.9 MEQ/L-%-% IV SOLN
INTRAVENOUS | Status: DC
  Administered 2011-12-20: 03:00:00 via INTRAVENOUS
  Filled 2011-12-20 (×4): qty 1000

## 2011-12-20 MED ORDER — SODIUM CHLORIDE 0.9 % IV SOLN
INTRAVENOUS | Status: DC
  Administered 2011-12-20 – 2011-12-23 (×5): via INTRAVENOUS

## 2011-12-20 MED ORDER — ACETAMINOPHEN 500 MG PO TABS
1000.0000 mg | ORAL_TABLET | Freq: Four times a day (QID) | ORAL | Status: DC | PRN
  Filled 2011-12-20: qty 2

## 2011-12-20 MED ORDER — ENOXAPARIN SODIUM 40 MG/0.4ML ~~LOC~~ SOLN
40.0000 mg | SUBCUTANEOUS | Status: DC
  Administered 2011-12-20 – 2011-12-21 (×2): 40 mg via SUBCUTANEOUS
  Filled 2011-12-20 (×2): qty 0.4

## 2011-12-20 MED ORDER — DEXTROMETHORPHAN POLISTIREX 30 MG/5ML PO LQCR
60.0000 mg | Freq: Two times a day (BID) | ORAL | Status: DC
  Administered 2011-12-20 – 2011-12-23 (×8): 60 mg via ORAL
  Filled 2011-12-20 (×11): qty 10

## 2011-12-20 MED ORDER — ALBUTEROL SULFATE (5 MG/ML) 0.5% IN NEBU
2.5000 mg | INHALATION_SOLUTION | Freq: Four times a day (QID) | RESPIRATORY_TRACT | Status: DC
  Administered 2011-12-20 (×3): 2.5 mg via RESPIRATORY_TRACT
  Filled 2011-12-20 (×2): qty 0.5

## 2011-12-20 MED ORDER — BUDESONIDE-FORMOTEROL FUMARATE 160-4.5 MCG/ACT IN AERO
2.0000 | INHALATION_SPRAY | Freq: Two times a day (BID) | RESPIRATORY_TRACT | Status: DC
  Administered 2011-12-20 – 2011-12-23 (×7): 2 via RESPIRATORY_TRACT
  Filled 2011-12-20: qty 6

## 2011-12-20 MED ORDER — OSELTAMIVIR PHOSPHATE 75 MG PO CAPS
75.0000 mg | ORAL_CAPSULE | Freq: Two times a day (BID) | ORAL | Status: DC
  Administered 2011-12-20 – 2011-12-23 (×8): 75 mg via ORAL
  Filled 2011-12-20 (×9): qty 1

## 2011-12-20 MED ORDER — KETOROLAC TROMETHAMINE 30 MG/ML IJ SOLN
30.0000 mg | Freq: Four times a day (QID) | INTRAMUSCULAR | Status: DC | PRN
  Filled 2011-12-20: qty 1

## 2011-12-20 MED ORDER — ALBUTEROL SULFATE HFA 108 (90 BASE) MCG/ACT IN AERS
2.0000 | INHALATION_SPRAY | RESPIRATORY_TRACT | Status: DC | PRN
  Administered 2011-12-22: 2 via RESPIRATORY_TRACT
  Filled 2011-12-20: qty 6.7

## 2011-12-20 NOTE — Progress Notes (Signed)
Subjective: Feels better, still weak and having body aches, breathing is better  Objective: Vital signs in last 24 hours: Temp:  [97.7 F (36.5 C)-101 F (38.3 C)] 97.7 F (36.5 C) (12/25 0513) Pulse Rate:  [94-132] 98  (12/25 0513) Resp:  [18-28] 20  (12/25 0513) BP: (92-131)/(42-68) 95/59 mmHg (12/25 0513) SpO2:  [86 %-100 %] 94 % (12/25 0513) FiO2 (%):  [29 %] 29 % (12/24 2245) Weight:  [59.5 kg (131 lb 2.8 oz)-60.1 kg (132 lb 7.9 oz)] 132 lb 7.9 oz (60.1 kg) (12/25 0513) Weight change:  Last BM Date: 12/19/11  Intake/Output from previous day: 12/24 0701 - 12/25 0700 In: 491.7 [I.V.:491.7] Out: -    Physical Exam: General: Alert, awake, oriented x3, in no acute distress. HEENT: No bruits, no goiter. Heart: Regular rate and rhythm, without murmurs, rubs, gallops. Lungs: scattered ronchi. Abdomen: Soft, nontender, nondistended, positive bowel sounds. Extremities: No clubbing cyanosis or edema with positive pedal pulses. Neuro: Grossly intact, nonfocal.   Lab Results: Basic Metabolic Panel:  Basename 12/20/11 0536 12/20/11 0009 12/19/11 2045  NA 136 -- 134*  K 3.5 -- 3.8  CL 103 -- 96  CO2 25 -- 29  GLUCOSE 134* -- 129*  BUN 7 -- 7  CREATININE 0.70 0.75 --  CALCIUM 8.1* -- 9.2  MG -- -- --  PHOS -- -- --   Liver Function Tests: No results found for this basename: AST:2,ALT:2,ALKPHOS:2,BILITOT:2,PROT:2,ALBUMIN:2 in the last 72 hours No results found for this basename: LIPASE:2,AMYLASE:2 in the last 72 hours No results found for this basename: AMMONIA:2 in the last 72 hours CBC:  Basename 12/20/11 0536 12/20/11 0009 12/19/11 2045  WBC 6.8 8.4 --  NEUTROABS -- -- 4.9  HGB 12.1 12.3 --  HCT 36.8 37.1 --  MCV 94.4 93.5 --  PLT 102* 109* --   Cardiac Enzymes: No results found for this basename: CKTOTAL:3,CKMB:3,CKMBINDEX:3,TROPONINI:3 in the last 72 hours BNP: No results found for this basename: PROBNP:3 in the last 72 hours D-Dimer: No results found for  this basename: DDIMER:2 in the last 72 hours CBG: No results found for this basename: GLUCAP:6 in the last 72 hours Hemoglobin A1C: No results found for this basename: HGBA1C in the last 72 hours Fasting Lipid Panel: No results found for this basename: CHOL,HDL,LDLCALC,TRIG,CHOLHDL,LDLDIRECT in the last 72 hours Thyroid Function Tests: No results found for this basename: TSH,T4TOTAL,FREET4,T3FREE,THYROIDAB in the last 72 hours Anemia Panel: No results found for this basename: VITAMINB12,FOLATE,FERRITIN,TIBC,IRON,RETICCTPCT in the last 72 hours Coagulation: No results found for this basename: LABPROT:2,INR:2 in the last 72 hours Urine Drug Screen: Drugs of Abuse  No results found for this basename: labopia, cocainscrnur, labbenz, amphetmu, thcu, labbarb    Alcohol Level: No results found for this basename: ETH:2 in the last 72 hours  No results found for this or any previous visit (from the past 240 hour(s)).  Studies/Results: Dg Chest 2 View  12/19/2011  *RADIOLOGY REPORT*  Clinical Data: Shortness of breath, productive cough, fever  CHEST - 2 VIEW  Comparison: Chest x-ray of 06/20/2005  Findings: The lungs are clear but hyperaerated consistent with COPD.  No infiltrate or effusion is seen. Mediastinal contours appear normal.  The heart is within normal limits in size.  No acute bony abnormality is seen.  Calcification of the right breast implant is noted.  IMPRESSION:  COPD.  No active lung disease.  Original Report Authenticated By: Juline Patch, M.D.    Medications: Scheduled Meds:   . acetaminophen  650  mg Oral Once  . albuterol  2.5 mg Nebulization Q6H  . albuterol  5 mg Nebulization Once  . albuterol  10 mg/hr Nebulization Once  . aspirin EC  81 mg Oral Daily  . budesonide-formoterol  2 puff Inhalation BID  . dextromethorphan  60 mg Oral BID  . enoxaparin  40 mg Subcutaneous Q24H  . ipratropium  0.5 mg Nebulization Once  . ipratropium  1 mg Nebulization Once  .  oseltamivir  75 mg Oral BID  . sodium chloride  1,000 mL Intravenous Once  . DISCONTD: albuterol  10 mg Nebulization Once  . DISCONTD: ipratropium  0.5 mg Nebulization Once  . DISCONTD: ipratropium  1 mg Nebulization Once   Continuous Infusions:   . sodium chloride 100 mL/hr at 12/20/11 0951  . DISCONTD: sodium chloride 125 mL/hr at 12/19/11 2155  . DISCONTD: dextrose 5 % and 0.9 % NaCl with KCl 20 mEq/L 125 mL/hr at 12/20/11 0649   PRN Meds:.acetaminophen, albuterol, ketorolac, ondansetron (ZOFRAN) IV, ondansetron  Assessment/Plan: 1. Influenza/ Viral syndrome, continue supportive care with IVF, Tamiflu, nebs 2. COPD (chronic obstructive pulmonary disease): not currently in exacerbation, continue albuterol nebs PRN, if worsens from respiratory standpoint will need steroids 3. Tobacco cessation: counselled 4. DVT Prophylaxis: lovenox    LOS: 1 day   Taylor Burnett [facility] Pager: [phone] 12/20/2011, 5:10 PM

## 2011-12-21 NOTE — Progress Notes (Addendum)
Subjective: Patient seen and examined, breathing better however complaining of watery diarrhea.  Objective: Vital signs in last 24 hours: Temp:  [98.7 F (37.1 C)-99 F (37.2 C)] 98.7 F (37.1 C) (12/26 0502) Pulse Rate:  [80-87] 87  (12/26 0502) Resp:  [22-23] 22  (12/26 0502) BP: (115-127)/(74-76) 127/76 mmHg (12/26 0502) SpO2:  [93 %-97 %] 93 % (12/26 0502) Weight change:  Last BM Date: 12/19/11  Intake/Output from previous day: 12/25 0701 - 12/26 0700 In: 1220 [P.O.:120; I.V.:1100] Out: -  Total I/O In: 1203 [P.O.:1203] Out: -    Physical Exam: General: Alert, awake, oriented x3, in no acute distress. Heart: Regular rate and rhythm, without murmurs Lungs: Clear to auscultation bilaterally. Abdomen: Soft, nontender, nondistended, positive bowel sounds. Extremities: No clubbing cyanosis or edema with positive pedal pulses. Neuro: Grossly intact, nonfocal.    Lab Results: No results found for this or any previous visit (from the past 24 hour(s)).  Studies/Results: Dg Chest 2 View  12/19/2011  *RADIOLOGY REPORT*  Clinical Data: Shortness of breath, productive cough, fever  CHEST - 2  IMPRESSION:  COPD.  No active lung disease.  Original Report Authenticated By: Juline Patch, M.D.    Medications:    . aspirin EC  81 mg Oral Daily  . budesonide-formoterol  2 puff Inhalation BID  . dextromethorphan  60 mg Oral BID  . enoxaparin  40 mg Subcutaneous Q24H  . oseltamivir  75 mg Oral BID  . DISCONTD: albuterol  2.5 mg Nebulization Q6H    acetaminophen, albuterol, ketorolac, ondansetron (ZOFRAN) IV, ondansetron     . sodium chloride 100 mL/hr at 12/21/11 1610    Assessment/Plan:  Principal Problem:  *Influenza Improving Continue Tamiflu, IV fluid Active Problems:  COPD (chronic obstructive pulmonary disease) Improving. Continue nebs when necessary Try to wean off oxygen gradually as tolerated. Diarrhea Check for C. difficile PCR.  The Thrombocytopenia Unknown etiology, change Lovenox to a SCD Check liver function tests.    LOS: 2 days   Shikara Mcauliffe 12/21/2011, 1:18 PM

## 2011-12-21 NOTE — Progress Notes (Signed)
Utilization Review Completed.  Taylor Burnett  12/21/2011 

## 2011-12-22 DIAGNOSIS — D696 Thrombocytopenia, unspecified: Secondary | ICD-10-CM

## 2011-12-22 DIAGNOSIS — J449 Chronic obstructive pulmonary disease, unspecified: Secondary | ICD-10-CM

## 2011-12-22 LAB — COMPREHENSIVE METABOLIC PANEL
Alkaline Phosphatase: 46 U/L (ref 39–117)
BUN: 3 mg/dL — ABNORMAL LOW (ref 6–23)
Creatinine, Ser: 0.7 mg/dL (ref 0.50–1.10)
GFR calc Af Amer: >90 mL/min (ref >90)
Glucose, Bld: 94 mg/dL (ref 70–99)
Potassium: 3.8 mEq/L (ref 3.5–5.1)
Total Bilirubin: 0.2 mg/dL — ABNORMAL LOW (ref 0.3–1.2)
Total Protein: 5.4 g/dL — ABNORMAL LOW (ref 6.0–8.3)

## 2011-12-22 LAB — CBC
HCT: 36.6 % (ref 36.0–46.0)
Platelets: 96 10*3/uL — ABNORMAL LOW (ref 150–400)
RBC: 3.91 MIL/uL (ref 3.87–5.11)
RDW: 13.4 % (ref 11.5–15.5)
WBC: 3.5 10*3/uL — ABNORMAL LOW (ref 4.0–10.5)

## 2011-12-22 LAB — SAVE SMEAR

## 2011-12-22 NOTE — Progress Notes (Signed)
Patient ID: Taylor Burnett, female   DOB: [birth year], 60 y.o.   MRN: [id number] Subjective: Patient feels weak.  Diarrhea resolved. Last episode yesterday.  Objective: Vital signs in last 24 hours: Temp:  [97.5 F (36.4 C)-98.7 F (37.1 C)] 97.5 F (36.4 C) (12/27 1406) Pulse Rate:  [73-82] 82  (12/27 1406) Resp:  [20-21] 21  (12/27 1406) BP: (95-120)/(63-77) 120/77 mmHg (12/27 1406) SpO2:  [94 %] 94 % (12/27 1406) Weight:  [61.2 kg (134 lb 14.7 oz)] 134 lb 14.7 oz (61.2 kg) (12/27 0534) Weight change:  Last BM Date: 12/21/11  Intake/Output from previous day: 12/26 0701 - 12/27 0700 In: 480 [P.O.:480] Out: -  Total I/O In: 480 [P.O.:480] Out: -    Physical Exam: General: Alert, awake, oriented x3, in no acute distress., pale appears fatigued. Heart: Regular rate and rhythm, without murmurs Lungs: Expiratory wheeze bilaterally.   Abdomen: Soft, nontender, nondistended, positive bowel sounds. Extremities: No clubbing cyanosis or edema with positive pedal pulses. Neuro: Grossly intact, nonfocal.    Lab Results: Results for orders placed during the hospital encounter of 12/19/11 (from the past 24 hour(s))  CLOSTRIDIUM DIFFICILE BY PCR     Status: Normal   Collection Time   12/21/11  7:40 PM      Component Value Range   C difficile by pcr NEGATIVE  NEGATIVE   COMPREHENSIVE METABOLIC PANEL     Status: Abnormal   Collection Time   12/22/11  5:45 AM      Component Value Range   Sodium 142  135 - 145 (mEq/L)   Potassium 3.8  3.5 - 5.1 (mEq/L)   Chloride 104  96 - 112 (mEq/L)   CO2 33 (*) 19 - 32 (mEq/L)   Glucose, Bld 94  70 - 99 (mg/dL)   BUN 3 (*) 6 - 23 (mg/dL)   Creatinine, Ser 7.82  0.50 - 1.10 (mg/dL)   Calcium 8.5  8.4 - 95.6 (mg/dL)   Total Protein 5.4 (*) 6.0 - 8.3 (g/dL)   Albumin 2.8 (*) 3.5 - 5.2 (g/dL)   AST 14  0 - 37 (U/L)   ALT 9  0 - 35 (U/L)   Alkaline Phosphatase 46  39 - 117 (U/L)   Total Bilirubin 0.2 (*) 0.3 - 1.2 (mg/dL)   GFR calc non Af  Amer >90  >90 (mL/min)   GFR calc Af Amer >90  >90 (mL/min)  CBC     Status: Abnormal   Collection Time   12/22/11  5:45 AM      Component Value Range   WBC 3.5 (*) 4.0 - 10.5 (K/uL)   RBC 3.91  3.87 - 5.11 (MIL/uL)   Hemoglobin 12.0  12.0 - 15.0 (g/dL)   HCT 21.3  08.6 - 57.8 (%)   MCV 93.6  78.0 - 100.0 (fL)   MCH 30.7  26.0 - 34.0 (pg)   MCHC 32.8  30.0 - 36.0 (g/dL)   RDW 46.9  62.9 - 52.8 (%)   Platelets 96 (*) 150 - 400 (K/uL)    Studies/Results: Dg Chest 2 View  12/19/2011  *RADIOLOGY REPORT*  Clinical Data: Shortness of breath, productive cough, fever  CHEST - 2  IMPRESSION:  COPD.  No active lung disease.  Original Report Authenticated By: Juline Patch, M.D.    Medications:    . aspirin EC  81 mg Oral Daily  . budesonide-formoterol  2 puff Inhalation BID  . dextromethorphan  60 mg Oral BID  . oseltamivir  75 mg Oral BID  . DISCONTD: enoxaparin  40 mg Subcutaneous Q24H    acetaminophen, albuterol, ondansetron (ZOFRAN) IV, ondansetron, DISCONTD: ketorolac     . sodium chloride 100 mL/hr at 12/21/11 1635    Assessment/Plan:  Principal Problem:  *Influenza Improving Continue Tamiflu for 5 day course.  Active Problems:  COPD (chronic obstructive pulmonary disease) Still with mild wheeze.  Reports she always has chest pain. Continue nebs when necessary Try to wean off oxygen gradually as tolerated.  Follows with Dr. Shelle Iron for Pulmonary.  Will check and document ambulating oxygen sats.  May need home oxygen.  Diarrhea - C diff negative.  Diarrhea resolved.  Thrombocytopenia - Unknown etiology.  LFTs WNL.  Patient has no PCP (yet).  Will request hematology consult to evaluate.     LOS: 3 days   Stephani Police [phone] 12/22/2011, 2:35 PM

## 2011-12-22 NOTE — Progress Notes (Signed)
Patient was seen and examined by me this morning and she reported that she is feeling better, her shortness of breath is improving and her diarrhea had resolved. Apparently she mentioned to Ms. York NP that she always had chest pain as documented on the above note. On Further questioning patient denies any chest pain and stated that she had chest tightness and shortness of breath associated with wheezing on and off secondary to her COPD. Appreciate hematology evaluation. We are awaiting further recommendations.

## 2011-12-22 NOTE — Consult Note (Signed)
Reason for consult: Thrombocytopenia Consulting MD: Taylor Burnett Date of Consultation: 12/22/2011 PCP: none  HPI: 60 y/o female smoker/COPD admitted on 12/24 via ED with a 2 day history of fever, cough and SOB, myalgias,  accompanied by diarrhea (C diff negative) and abdominal cramps. She was diagnosed with Influenza, placed on Tamiflu  75 mg bid now d3 , oxygen and nebulizers. For DVT prophylaxis she was placed on Lovenox. On admission, she was noted to have a platelet count of 105k with no acute bleeding. Platelets dropped from 109 on 12/25, with repeat count of 102k. Lovenox was d/c'd and changed to SCD. Today, her plt count is 97k, LFTs are normal.  for which we were requested to see this patient in consultation. Pt denies a prior history of low platelet count. No easy bruising or gum or nose bleed. Pt was on  ketocorolac since late summer for pain. ASA during this hospitalization.  Pt denies any tick exposure. The only available lab report is from 2003, showing a normal platelet count of 263k then. No other data obtainable at this time.   PMH: Past Medical History  Diagnosis Date  . COPD (chronic obstructive pulmonary disease)     Surgeries: Past Surgical History  Procedure Date  . Tonsillectomy   . Tubal ligation   B breast implants  Allergies:  Allergies  Allergen Reactions  . Prednisone     Depression and suicidal    Medications:    Scheduled:   . aspirin EC  81 mg Oral Daily  . budesonide-formoterol  2 puff Inhalation BID  . dextromethorphan  60 mg Oral BID  . oseltamivir  75 mg Oral BID  . DISCONTD: enoxaparin  40 mg Subcutaneous Q24H   ZOX:[id number], albuterol, ondansetron (ZOFRAN) IV, ondansetron, DISCONTD: ketorolac  JYN:WGNFAO of Systems  Constitutional: Negative for weight loss. Positive for  for fever on admission, chills and malaise/fatigue. Of note, pt had declined back in October of this year her flu vaccination.  Eyes: Negative for blurred vision and  double vision.  Mouth: no gum bleeding Respiratory: Positive for chronic  for cough, and shortness of breath.  Cardiovascular: Negative for chest pain. GI: No nausea, vomiting,  Had diarrhea on admission.Marland Kitchen No change in bowel caliber. No  Melena or  Hematochezia.  GU: No blood in urine. No loss of urinary control. Skin: Negative for itching.. No petechia. No bruising. Recent whole body  rash following a medication given during the summer, ? name Neurological: No headaches. No motor or sensory deficits.  Family History: Family History  Problem Relation Age of Onset  . Emphysema Father   . Allergies Mother   . Allergies Son   . Allergies Brother   . Heart disease Maternal Grandmother   . Rheum arthritis Brother   . Cancer      aunts and uncles    Social History:  reports that she has been smoking Cigarettes.  She has a 2ppd x 40 yrs ( 80 pack-year )smoking history.  Now she smokes 5 cigarretes a day.She does not have any smokeless tobacco history on file. She reports that she does not drink alcohol or use illicit drugs.Pt currently not working. Mother died with Taylor Burnett disease; Father died with COPD. Has 3 brothers, all in fairly good health.  Lives in [place], [place]    Physical Exam  60 y.o. female in no acute distress  A. and O. x3 HEENT: Normocephalic, atraumatic, PERRLA. Oral cavity without thrush or lesions. Neck: supple. no thyromegaly,  no cervical or supraclavicular adenopathy  Lungs: clear bilaterally . No wheezing, rhonchi or rales. No axillary masses. Breasts: not examined. Cardiac: regular rate and rhythm normal S1-S2, no murmur , rubs or gallops Abdomen: soft , slightly tender over he RUQ  bowel sounds x4  no hepatosplenomegaly GU/rectal: deferred. Extremities: no clubbing cyanosis . No edema.No bruising or petechial rash Neurologic: A. and O. x3, no focal deficits       Labs  CBC  Lab 12/22/11 0545 12/20/11 0536 12/20/11 0009 12/19/11 2045  WBC 3.5* 6.8  8.4 6.7  HGB 12.0 12.1 12.3 15.4*  HCT 36.6 36.8 37.1 45.4  PLT 96* 102* 109* 105*  MCV 93.6 94.4 93.5 93.4  MCH 30.7 31.0 31.0 31.7  MCHC 32.8 32.9 33.2 33.9  RDW 13.4 13.3 13.1 13.0  LYMPHSABS -- -- -- 1.3  MONOABS -- -- -- 0.5  EOSABS -- -- -- 0.0  BASOSABS -- -- -- 0.0  BANDABS -- -- -- --    CMP    Lab 12/22/11 0545 12/20/11 0536 12/20/11 0009 12/19/11 2045  NA 142 136 -- 134*  K 3.8 3.5 -- 3.8  CL 104 103 -- 96  CO2 33* 25 -- 29  GLUCOSE 94 134* -- 129*  BUN 3* 7 -- 7  CREATININE 0.70 0.70 0.75 0.80  CALCIUM 8.5 8.1* -- 9.2  MG -- -- -- --  AST 14 -- -- --  ALT 9 -- -- --  ALKPHOS 46 -- -- --  BILITOT 0.2* -- -- --        Component Value Date/Time   BILITOT 0.2* 12/22/2011 0545      Imaging Studies: No results found.      A/P: 60 y.o. female asked to see for evaluation of thrombocytopenia in the setting of acute illness. Check smear for abnormalities (ordered). Dr.   Donnie Burnett  is to see the patient following this consult with recommendations regardingfurther workup studies.  Thank you for the referral.  Taylor Burnett 12/22/2011 2:47 PM    Attending   Patient examined, chart reviewed. Taylor Burnett was admitted with the flu, and was found to have a plt count of 104k. She has been recovering and plt count have hovered in the high 90's. Smear has been reviewed , no significant abnormalities were seen. plt count appeared to be accurate. I don;t suspect TTP or HIT. This is likely an acute response to influenza infection ( perhaps even an ITP-like phenomenon). I will check a few other parameters. If patient is to be discharged in the am , i can follow as an outpatient. Please call [phone] to make that appointment.  Taylor Crane MD [phone]

## 2011-12-22 NOTE — Progress Notes (Signed)
Pt ambulated in hall on room air 02 saturation was 90%

## 2011-12-23 DIAGNOSIS — D709 Neutropenia, unspecified: Secondary | ICD-10-CM | POA: Diagnosis not present

## 2011-12-23 LAB — RETICULOCYTES
RBC.: 4.15 MIL/uL (ref 3.87–5.11)
Retic Ct Pct: 0.5 % (ref 0.4–3.1)

## 2011-12-23 LAB — CBC
MCH: 30.7 pg (ref 26.0–34.0)
MCHC: 32.8 g/dL (ref 30.0–36.0)
RDW: 13.1 % (ref 11.5–15.5)

## 2011-12-23 MED ORDER — OSELTAMIVIR PHOSPHATE 75 MG PO CAPS: 75.0000 mg | ORAL_CAPSULE | Freq: Two times a day (BID) | ORAL | Status: AC

## 2011-12-23 NOTE — Progress Notes (Signed)
   CARE MANAGEMENT NOTE 12/23/2011  Patient:  Taylor Burnett, Taylor Burnett   Account Number:  [id number]  Date Initiated:  12/21/2011  Documentation initiated by:  Junius Creamer  Subjective/Objective Assessment:   adm w flu     Action/Plan:   lives alone   Anticipated DC Date:  12/23/2011   Anticipated DC Plan:  HOME/SELF CARE      DC Planning Services  CM consult           Status of service:  Completed, signed off  Discharge Disposition:  HOME/SELF CARE  Per UR Regulation:  Reviewed for med. necessity/level of care/duration of stay  Comments:  12/26 Eunice Blase dowell rn,bsn [phone]  12/23/11 1040 Kashawn Dirr RN MSN CCM Pt has [facility] and does not know who her assigned PCP is.  Per caseworker @ [facility], pt is to be followed @ [facility]. Appt arranged for Friday, January 4th, @ 1:00 p.m.  [facility].

## 2011-12-23 NOTE — Discharge Summary (Signed)
Patient ID: YOSHINO BROCCOLI MRN: [id number] DOB/AGE: July 09, 1951 60 y.o.  Admit date: 12/19/2011 Discharge date: 12/23/2011  Primary Care Physician:  No primary provider on file. Dr. Marcelyn Bruins, [facility] Dr. Pierce Crane, Hematology  Discharge Diagnoses:    Principal Problem:  *Influenza Active Problems:  COPD (chronic obstructive pulmonary disease)  Hypoxemia  Thrombocytopenia   Neutropenia   Current Discharge Medication List    START taking these medications   Details  oseltamivir (TAMIFLU) 75 MG capsule Take 1 capsule (75 mg total) by mouth 2 (two) times daily. Qty: 3 capsule, Refills: 0      CONTINUE these medications which have NOT CHANGED   Details  acetaminophen (TYLENOL) 500 MG tablet Take 1,000 mg by mouth every 6 (six) hours as needed. For pain/fever.     albuterol (VENTOLIN HFA) 108 (90 BASE) MCG/ACT inhaler Inhale 2 puffs into the lungs every 6 (six) hours as needed.      aspirin EC 81 MG tablet Take 81 mg by mouth daily.      budesonide-formoterol (SYMBICORT) 160-4.5 MCG/ACT inhaler Inhale 2 puffs into the lungs 2 (two) times daily. Qty: 1 Inhaler, Refills: 6    dextromethorphan (DELSYM) 30 MG/5ML liquid Take 60 mg by mouth 2 (two) times daily.          Consults:  Dr. Pierce Crane, Hematology.  Brief H and P:  60 y/o female smoker/COPD admitted on 12/24 via ED with a 2 day history of fever, cough and SOB, myalgias, accompanied by diarrhea (C diff negative) and abdominal cramps. She was diagnosed with Influenza, placed on Tamiflu 75 mg , oxygen and nebulizers. At the time of discharge she still has 3 doses of Tamiflu left to take for which she will receive a prescription to  Her diarrhea resolved, and her breathing has improved.  However her platlet count slowly dropped.  Hematology was consulted and the patient was seen by Dr. Donnie Coffin.  His impression was as follows:  Platelet count has hovered in the high 90's. Smear has been reviewed , no  significant abnormalities were seen. plt count appeared to be accurate.  I don;t suspect TTP or HIT. This is likely an acute response to influenza infection ( perhaps even an ITP-like phenomenon).  I will check a few other parameters. If patient is to be discharged in the am , I can follow as an outpatient.     Further on the morning of D/C, 12/28 her WBC count has dropped to 2.9.  I have called [phone] and requested that the office call the patient with a follow up appt.   This morning the patient is stable her breathing is improved but she still has mild wheezing. She follows with Dr. Shelle Iron for her COPD and I have scheduled a followup appointment for her to see Dr. Stann Mainland on January 15.  She will continue with her Symbicort and albuterol inhaler.  She no longer has any diarrhea and is eating well. She reports that she will go stay with her sister for a few days until she is feeling stronger.   Physical Exam on Discharge: General: Alert, awake, oriented x3, in no acute distress, pale HEENT: No bruits, no goiter. Heart: Regular rate and rhythm, without murmurs, rubs, gallops. Lungs: Minimal expiratory wheeze bilaterally Abdomen: Soft, nontender, nondistended, positive bowel sounds. Extremities: No clubbing cyanosis or edema with positive pedal pulses. Neuro: Grossly intact, nonfocal.  Filed Vitals:   12/22/11 1406 12/22/11 2055 12/22/11 2123 12/23/11 0438  BP: 120/77  102/68  121/80  Pulse: 82 76  66  Temp: 97.5 F (36.4 C) 97.8 F (36.6 C)  98.4 F (36.9 C)  TempSrc:  Axillary  Oral  Resp: 21 20  20   Height:      Weight:    60.7 kg (133 lb 13.1 oz)  SpO2: 94% 92% 95% 98%     Intake/Output Summary (Last 24 hours) at 12/23/11 0842 Last data filed at 12/23/11 0600  Gross per 24 hour  Intake   1690 ml  Output      0 ml  Net   1690 ml    Basic Metabolic Panel:  Lab 12/22/11 1610 12/20/11 0536  NA 142 136  K 3.8 3.5  CL 104 103  CO2 33* 25  GLUCOSE 94 134*  BUN 3* 7    CREATININE 0.70 0.70  CALCIUM 8.5 8.1*  MG -- --  PHOS -- --   Liver Function Tests:  Lab 12/22/11 0545  AST 14  ALT 9  ALKPHOS 46  BILITOT 0.2*  PROT 5.4*  ALBUMIN 2.8*   CBC:  Lab 12/23/11 0030 12/22/11 0545 12/19/11 2045  WBC 2.9* 3.5* --  NEUTROABS -- -- 4.9  HGB 12.6 12.0 --  HCT 38.4 36.6 --  MCV 93.7 93.6 --  PLT 127* 96* --   Anemia Panel:  Lab 12/23/11 0028  VITAMINB12 --  FOLATE --  FERRITIN --  TIBC --  IRON --  RETICCTPCT 0.5    Misc. Labs:Results for AKILI, CUDA (MRN [id number]) as of 12/23/2011 08:48  Ref. Range 12/20/2011 01:21 12/20/2011 05:36 12/21/2011 19:40 12/22/2011 05:45 12/23/2011 00:28  Smear Review No range found    SMEAR STAINED AND AVAILABLE FOR REVIEW   RBC. Latest Range: 3.87-5.11 MIL/uL     4.15  Retic Ct Pct Latest Range: 0.4-3.1 %     0.5  Retic Count, Manual Latest Range: 19.0-186.0 K/uL     20.8  Glucose Latest Range: 70-99 mg/dL  119 (H)  94   Influenza B By PCR Latest Range: NEGATIVE  POSITIVE (A)      C difficile by pcr Latest Range: NEGATIVE    NEGATIVE      Micro Results:  Other Pertinent Lab Results:    Significant Diagnostic Studies:  Dg Chest 2 View  12/19/2011  *RADIOLOGY REPORT*  Clinical Data: Shortness of breath, productive cough, fever  CHEST - 2 VIEW  Comparison: Chest x-ray of 06/20/2005  Findings: The lungs are clear but hyperaerated consistent with COPD.  No infiltrate or effusion is seen. Mediastinal contours appear normal.  The heart is within normal limits in size.  No acute bony abnormality is seen.  Calcification of the right breast implant is noted.  IMPRESSION:  COPD.  No active lung disease.  Original Report Authenticated By: Juline Patch, M.D.      Disposition and Follow-up: 1, Dr. Theron Arista Rubin's office will call the patient to schedule a follow up appointment. 2.  We have provided the patient with information regarding PCP's and request that she schedule an appoint to be seen in the next 2  weeks. 3.  She has an appointment scheduled with Dr. Marcelyn Bruins on 1/15.  Time spent on Discharge: 40 min.  SignedStephani Police 12/23/2011, 8:42 AM [phone]

## 2011-12-23 NOTE — Discharge Summary (Signed)
Patient seen and examined, feeling a lot better and denies any complaints, stable for discharge to home today to followup with hematology. Case manager is also working on helping her to establish PCP.

## 2011-12-28 ENCOUNTER — Encounter: Payer: Self-pay | Admitting: Oncology

## 2012-01-10 ENCOUNTER — Inpatient Hospital Stay: Payer: Medicaid Other | Admitting: Pulmonary Disease

## 2012-01-19 ENCOUNTER — Encounter (HOSPITAL_COMMUNITY): Payer: Medicaid Other | Attending: Hematology and Oncology

## 2012-01-19 ENCOUNTER — Encounter (HOSPITAL_COMMUNITY): Payer: Self-pay

## 2012-01-19 VITALS — BP 123/81 | HR 88 | Temp 97.1°F | Ht 62.0 in | Wt 128.8 lb

## 2012-01-19 DIAGNOSIS — D709 Neutropenia, unspecified: Secondary | ICD-10-CM

## 2012-01-19 DIAGNOSIS — D696 Thrombocytopenia, unspecified: Secondary | ICD-10-CM | POA: Insufficient documentation

## 2012-01-19 LAB — DIFFERENTIAL
Basophils Absolute: 0 10*3/uL (ref 0.0–0.1)
Eosinophils Absolute: 0.1 10*3/uL (ref 0.0–0.7)
Eosinophils Relative: 2 % (ref 0–5)
Lymphs Abs: 2.2 10*3/uL (ref 0.7–4.0)
Monocytes Absolute: 0.4 10*3/uL (ref 0.1–1.0)

## 2012-01-19 LAB — CBC
HCT: 44.1 % (ref 36.0–46.0)
MCH: 31.4 pg (ref 26.0–34.0)
MCV: 92.8 fL (ref 78.0–100.0)
Platelets: 217 10*3/uL (ref 150–400)
RDW: 13.3 % (ref 11.5–15.5)

## 2012-01-19 NOTE — Progress Notes (Signed)
This office note has been dictated.

## 2012-01-19 NOTE — Patient Instructions (Signed)
[facility]  Discharge Instructions  RECOMMENDATIONS MADE BY THE CONSULTANT AND ANY TEST RESULTS WILL BE SENT TO YOUR REFERRING DOCTOR.   EXAM FINDINGS BY MD TODAY AND SIGNS AND SYMPTOMS TO REPORT TO CLINIC OR PRIMARY MD:  Your labs are perfect today.  Possible primary care physcians: [facility]---[phone] [facility]---[phone] Dr. Ronaldo Miyamoto   I acknowledge that I have been informed and understand all the instructions given to me and received a copy. I do not have any more questions at this time, but understand that I may call the Specialty Clinic at [facility] at [phone] during business hours should I have any further questions or need assistance in obtaining follow-up care.    __________________________________________  _____________  __________ Signature of Patient or Authorized Representative            Date                   Time    __________________________________________ Nurse's Signature

## 2012-01-20 ENCOUNTER — Ambulatory Visit (HOSPITAL_COMMUNITY): Payer: Medicaid Other | Admitting: Oncology

## 2012-01-20 NOTE — Progress Notes (Signed)
CC:   Health Department Barbaraann Share, MD,FCCP  IDENTIFYING PATIENT:  Patient is a 61 year old woman seen at the request of Dr. Pierce Crane with neutropenia and anemia.  The patient was admitted to [facility] with a flu-like illness over Christmas that consisted of fever, cough, shortness of breath, myalgias accompanied by diarrhea.  She was diagnosed with influenza and managed medically.  She was found to have abnormal blood work which recorded as follows:  On 12/20/2011, white cell count 6.8, hemoglobin 12.1, hematocrit 36.8, platelets 102; 12/22/2011 white cell count 3.5, hemoglobin 12, hematocrit 36.6, platelets 96.  She was seen by Dr. Pierce Crane who reviewed her peripheral smear.  He noted that this was unremarkable.  He concluded that abnormal labs may have resulted in the setting of an acute illness.  The patient is here for followup visit.  She reports nothing unusual.  She has good energy levels.  She has had no further symptoms as she had previously.  She has not lost any weight.  Denies adenopathy.  MEDICATIONS:  Aspirin 75 mg daily, Albuterol 2 puffs q.6 p.r.n., Symbicort inhaler 2 puffs 2 times a day, Delsym 50 mg 2 times a day.  ALLERGIES:  Prednisone.  SOCIAL HISTORY:  Patient is widowed with 3 children.  She lives in [place].  Is a long-term smoker, currently smoking 1/4 pack per day and has done so for 33 years.  Denies alcohol use.  PAST MEDICAL HISTORY:  COPD/emphysema, status post bilateral breast implants, history of bulging lower vertebral disks.  FAMILY HISTORY:  Mother died from [place] disease.  Brother had unknown carcinoma.  REVIEW OF SYSTEMS:  Fourteen-point review of systems negative.  PHYSICAL EXAM:  General:  Patient is alert, oriented x3.  Vitals:  Pulse 88, blood pressure 122/81, temp 97.1, respirations 18, weight 138 pounds.  HEENT:  Head is atraumatic, normocephalic.  Sclerae anicteric. Mouth moist.  Neck:  Supple.   Chest:  Scattered rhonchi bilaterally. CVS:  First and second heart sounds present.  No added sounds or murmurs.  Abdomen:  Soft, nontender.  Bowel sounds present. Extremities:  No edema.  Lymph nodes:  No adenopathy.  CNS:  Nonfocal.  IMPRESSION AND PLAN:  Mrs. Dedic is a 61 year old woman who was found to be neutropenic and thrombocytopenic during flu-like illness a month ago.  I had her return to the lab and repeat a CBC which notes; 01/15/2012 white cell count 5.3, hemoglobin 14.9, hematocrit 44.1, platelets at 217.  Differential was unremarkable.  With this said, the patient was reassured and told that her CBC is currently unremarkable.  She was therefore discharged and will followup p.r.n.    ______________________________ Laurice Record, M.D. LIO/MEDQ  D:  01/19/2012  T:  01/19/2012  Job:  [id number]

## 2012-05-14 ENCOUNTER — Ambulatory Visit: Payer: Medicaid Other | Admitting: Pulmonary Disease

## 2012-05-28 ENCOUNTER — Emergency Department (HOSPITAL_COMMUNITY)
Admission: EM | Admit: 2012-05-28 | Discharge: 2012-05-28 | Disposition: A | Discharge status: Home / Self Care | Payer: Medicaid Other | Attending: Emergency Medicine | Admitting: Emergency Medicine

## 2012-05-28 ENCOUNTER — Encounter (HOSPITAL_COMMUNITY): Payer: Self-pay | Admitting: *Deleted

## 2012-05-28 DIAGNOSIS — T7840XA Allergy, unspecified, initial encounter: Secondary | ICD-10-CM

## 2012-05-28 DIAGNOSIS — M129 Arthropathy, unspecified: Secondary | ICD-10-CM | POA: Insufficient documentation

## 2012-05-28 DIAGNOSIS — F410 Panic disorder [episodic paroxysmal anxiety] without agoraphobia: Secondary | ICD-10-CM | POA: Insufficient documentation

## 2012-05-28 DIAGNOSIS — R21 Rash and other nonspecific skin eruption: Secondary | ICD-10-CM | POA: Insufficient documentation

## 2012-05-28 DIAGNOSIS — F17200 Nicotine dependence, unspecified, uncomplicated: Secondary | ICD-10-CM | POA: Insufficient documentation

## 2012-05-28 DIAGNOSIS — J4489 Other specified chronic obstructive pulmonary disease: Secondary | ICD-10-CM | POA: Insufficient documentation

## 2012-05-28 DIAGNOSIS — W57XXXA Bitten or stung by nonvenomous insect and other nonvenomous arthropods, initial encounter: Secondary | ICD-10-CM

## 2012-05-28 DIAGNOSIS — J449 Chronic obstructive pulmonary disease, unspecified: Secondary | ICD-10-CM | POA: Insufficient documentation

## 2012-05-28 DIAGNOSIS — R1032 Left lower quadrant pain: Secondary | ICD-10-CM | POA: Insufficient documentation

## 2012-05-28 HISTORY — DX: Panic disorder (episodic paroxysmal anxiety): F41.0

## 2012-05-28 MED ORDER — DOXYCYCLINE HYCLATE 100 MG PO CAPS: 100.0000 mg | ORAL_CAPSULE | Freq: Two times a day (BID) | ORAL | Status: DC

## 2012-05-28 NOTE — ED Provider Notes (Signed)
History     CSN: [id number]  Arrival date & time 05/28/12  1659   None     Chief Complaint  Patient presents with  . Tick Removal    (Consider location/radiation/quality/duration/timing/severity/associated sxs/prior treatment) HPI Comments: Patient reports removal of a tick to her lower left abdomen four days prior to ED arrival.  States that she has been cleaning the area and applying neosporin ointment.  Noticing increased "sorenes" and itching, redness of the same area.  Also noticed small "bumps" to the same area.  Denies fever, chills, vomiting or joint pain  Patient is a 61 y.o. female presenting with rash. The history is provided by the patient.  Rash  This is a new problem. The current episode started more than 2 days ago. The problem has not changed since onset.Associated with: tick bite. There has been no fever. The rash is present on the abdomen. The pain is mild. The pain has been constant since onset. Associated symptoms include itching. Pertinent negatives include no blisters, no pain and no weeping. Treatments tried: neosporin. The treatment provided no relief.    Past Medical History  Diagnosis Date  . COPD (chronic obstructive pulmonary disease)   . Arthritis   . Panic attacks     Past Surgical History  Procedure Date  . Tonsillectomy   . Tubal ligation   . Breast enhancement surgery 1990    Family History  Problem Relation Age of Onset  . Emphysema Father   . Allergies Mother   . Allergies Son   . Allergies Brother   . Heart disease Maternal Grandmother   . Rheum arthritis Brother   . Cancer      aunts and uncles    History  Substance Use Topics  . Smoking status: Former Smoker -- 2.0 packs/day for 40 years    Types: Cigarettes    Quit date: 12/19/2011  . Smokeless tobacco: Not on file   Comment: currently smoking 5 cigs daily.  . Alcohol Use: No    OB History    Grav Para Term Preterm Abortions TAB SAB Ect Mult Living                   Review of Systems  Constitutional: Negative for fever, chills and appetite change.  HENT: Negative for trouble swallowing and neck pain.   Gastrointestinal: Negative for nausea, vomiting and abdominal pain.  Musculoskeletal: Negative for joint swelling and gait problem.  Skin: Positive for itching and rash.  Neurological: Negative for dizziness and headaches.  All other systems reviewed and are negative.    Allergies  Prednisone  Home Medications   Current Outpatient Rx  Name Route Sig Dispense Refill  . ACETAMINOPHEN 500 MG PO TABS Oral Take 1,000 mg by mouth every 6 (six) hours as needed. For pain/fever.     . ALBUTEROL SULFATE HFA 108 (90 BASE) MCG/ACT IN AERS Inhalation Inhale 2 puffs into the lungs every 6 (six) hours as needed.      . ASPIRIN EC 81 MG PO TBEC Oral Take 81 mg by mouth daily.      . BUDESONIDE-FORMOTEROL FUMARATE 160-4.5 MCG/ACT IN AERO Inhalation Inhale 2 puffs into the lungs 2 (two) times daily. 1 Inhaler 6  . DEXTROMETHORPHAN POLISTIREX ER 30 MG/5ML PO LQCR Oral Take 60 mg by mouth 2 (two) times daily.        BP 116/63  Pulse 77  Temp(Src) 97.8 F (36.6 C) (Oral)  Resp 20  Ht 5\' 2"  (  1.575 m)  Wt 129 lb (58.514 kg)  BMI 23.59 kg/m2  SpO2 96%  Physical Exam  Nursing note and vitals reviewed. Constitutional: She is oriented to person, place, and time. She appears well-developed and well-nourished. No distress.  HENT:  Head: Normocephalic and atraumatic.  Cardiovascular: Normal rate, regular rhythm, normal heart sounds and intact distal pulses.   No murmur heard. Pulmonary/Chest: Effort normal and breath sounds normal.  Abdominal: Soft. She exhibits no distension. There is no tenderness. There is no rebound.         Localized area of erythema with pin point vesicles and single scabbed area to the center.  No pustules, or central clearing.  No other rashes   Musculoskeletal: She exhibits no tenderness.  Neurological: She is alert and oriented  to person, place, and time. She exhibits normal muscle tone. Coordination normal.  Skin:       See abd exam    ED Course  Procedures (including critical care time)       MDM    Patient is alert. Nontoxic appearing. Localized area of erythema with scattered pinpoint vesicles and a central, scabbed lesion to the lower left abdomen. No tick parts remain. Area appears to be a localized reaction to Neosporin. Have advised patient to discontinue Neosporin and peroxide and clean the area with soap and water and apply hydrocortisone cream. She agrees to close followup or to return for worsening symptoms.    Patient / Family / Caregiver understand and agree with initial ED impression and plan with expectations set for ED visit. Pt stable in ED with no significant deterioration in condition. Pt feels improved after observation and/or treatment in ED.    Prescribed:  doxycycline    Taylor Burnett L. Toriann Spadoni, [place] 06/01/12 1820

## 2012-05-28 NOTE — ED Notes (Signed)
Red swollen area to lt abd.since Thursday

## 2012-05-28 NOTE — Discharge Instructions (Signed)
Drug Allergy A drug allergy means you have a strange reaction to a medicine. You may have puffiness (swelling), itching, red rashes, and hives. Some allergic reactions can be life-threatening. HOME CARE  If you do not know what caused your reaction:  Write down medicines you use.   Write down any problems you have after using medicine.   Avoid things that cause a reaction.   You can see an allergy doctor to be tested for allergies.  If you have hives or a rash:  Take medicine as told by your doctor.   Place cold cloths on your skin.   Do not take hot baths or hot showers. Take baths in cool water.  If you are severely allergic:  Wear a medical bracelet or necklace that lists your allergy.   Carry your allergy kit or medicine shot to treat severe allergic reactions with you. These can save your life.   Do not drive until medicine from your shot has worn off, unless your doctor says it is okay.  GET HELP RIGHT AWAY IF:   Your mouth is puffy, or you have trouble breathing.   You have a tight feeling in your chest or throat.   You have hives, puffiness, or itching all over your body.   You throw up (vomit) or have watery poop (diarrhea).   You feel dizzy or pass out (faint).   You think you are having a reaction. Problems often start within 30 minutes after taking a medicine.   You are getting worse, not better.   You have new problems.   Your problems go away and then come back.  This is an emergency. Use your medicine shot or allergy kit as told. Call yourlocal emergency services (911 in [place] after the shot. Even if you feel better after the shot, you need to go to the hospital. You may need more medicine to control a severe reaction. MAKE SURE YOU:  Understand these instructions.   Will watch your condition.   Will get help right away if you are not doing well or get worse.  Document Released: 01/19/2005 Document Revised: 12/01/2011 Document Reviewed:  06/09/2011 [facility] Patient Information 2012 [place], [place].Wood Tick Bite Ticks are insects that attach themselves to the skin. Most tick bites are harmless, but sometimes ticks carry diseases that can make a person quite ill. The chance of getting ill depends on:  The kind of tick that bites you.   Time of year.   How long the tick is attached.   Geographic location.  Wood ticks are also called dog ticks. They are generally black. They can have white markings. They live in shrubs and grassy areas. They are larger than deer ticks. Wood ticks are about the size of a watermelon seed. They have a hard body. The most common places for ticks to attach themselves are the scalp, neck, armpits, waist, and groin. Wood ticks may stay attached for up to 2 weeks. TICKS MUST BE REMOVED AS SOON AS POSSIBLE TO HELP PREVENT DISEASES CAUSED BY TICK BITES.  TO REMOVE A TICK: 1. If available, put on latex gloves before trying to remove a tick.  2. Grasp the tick as close to the skin as possible, with curved forceps, fine tweezers or a special tick removal tool.  3. Pull gently with steady pressure until the tick lets go. Do not twist the tick or jerk it suddenly. This may break off the tick's head or mouth parts.  4. Do not  crush the tick's body. This could force disease-carrying fluids from the tick into your body.  5. After the tick is removed, wash the bite area and your hands with soap and water or other disinfectant.  6. Apply a small amount of antiseptic cream or ointment to the bite site.  7. Wash and disinfect any instruments that were used.  8. Save the tick in a jar or plastic bag for later identification. Preserve the tick with a bit of alcohol or put it in the freezer.  9. Do not apply a hot match, petroleum jelly, or fingernail polish to the tick. This does not work and may increase the chances of disease from the tick bite.  YOU MAY NEED TO SEE YOUR CAREGIVER FOR A TETANUS SHOT NOW IF:  You  have no idea when you had the last one.   You have never had a tetanus shot before.  If you need a tetanus shot, and you decide not to get one, there is a rare chance of getting tetanus. Sickness from tetanus can be serious. If you get a tetanus shot, your arm may swell, get red and warm to the touch at the shot site. This is common and not a problem. PREVENTION  Wear protective clothing. Long sleeves and pants are best.   Wear white clothes to see ticks more easily   Tuck your pant legs into your socks.   If walking on trail, stay in the middle of the trail to avoid brushing against bushes.   Put insect repellent on all exposed skin and along boot tops, pant legs and sleeve cuffs   Check clothing, hair and skin repeatedly and before coming inside.   Brush off any ticks that are not attached.  SEEK MEDICAL CARE IF:   You cannot remove a tick or part of the tick that is left in the skin.   Unexplained fever.   Redness and swelling in the area of the tick bite.   Tender, swollen lymph glands.   Diarrhea.   Weight loss.   Cough.   Fatigue.   Muscle, joint or bone pain.   Belly pain.   Headache.   Rash.  SEEK IMMEDIATE MEDICAL CARE IF:   You develop an oral temperature above 102 F (38.9 C).   You are having trouble walking or moving your legs.   Numbness in the legs.   Shortness of breath.   Confusion.   Repeated vomiting.  Document Released: 12/09/2000 Document Revised: 12/01/2011 Document Reviewed: 11/17/2008 [facility] Patient Information 2012 [place], [place].

## 2012-06-01 NOTE — ED Provider Notes (Signed)
Medical screening examination/treatment/procedure(s) were performed by non-physician practitioner and as supervising physician I was immediately available for consultation/collaboration.   Glynn Octave, MD 06/01/12 [phone]

## 2012-06-08 ENCOUNTER — Ambulatory Visit (INDEPENDENT_AMBULATORY_CARE_PROVIDER_SITE_OTHER): Payer: Medicaid Other | Admitting: Pulmonary Disease

## 2012-06-08 ENCOUNTER — Encounter: Payer: Self-pay | Admitting: Pulmonary Disease

## 2012-06-08 ENCOUNTER — Ambulatory Visit (INDEPENDENT_AMBULATORY_CARE_PROVIDER_SITE_OTHER)
Admission: RE | Admit: 2012-06-08 | Discharge: 2012-06-08 | Disposition: A | Discharge status: Home / Self Care | Payer: Medicaid Other | Source: Ambulatory Visit | Attending: Pulmonary Disease | Admitting: Pulmonary Disease

## 2012-06-08 VITALS — BP 110/82 | HR 81 | Temp 97.7°F | Ht 62.0 in | Wt 126.3 lb

## 2012-06-08 DIAGNOSIS — J449 Chronic obstructive pulmonary disease, unspecified: Secondary | ICD-10-CM

## 2012-06-08 DIAGNOSIS — J4489 Other specified chronic obstructive pulmonary disease: Secondary | ICD-10-CM

## 2012-06-08 DIAGNOSIS — R0789 Other chest pain: Secondary | ICD-10-CM | POA: Insufficient documentation

## 2012-06-08 NOTE — Assessment & Plan Note (Signed)
The patient is having atypical chest pain that is very difficult to characterize.  It is unclear if this may be coming from her significant objective lung disease, or possibly from underlying cardiac disease.  She tells me that she had a catheterization more than 10 years ago, but has not had a recent cardiac evaluation.  I would like to check a chest x-ray today for completeness, and we'll also see if adding an anticholinergic to her regimen will help with her symptoms.  If she continues to have issues, or certainly if they worsen, she will require a cardiac evaluation.

## 2012-06-08 NOTE — Assessment & Plan Note (Signed)
The patient feels that her dyspnea on exertion is near her usual baseline, but she is having increased issues with orthopnea.  She is not having any increased lower extremity edema.  She is also having some increased cough, but no mucus production.  She was not able to tolerate Spiriva in the past, so we'll try her on a different anticholinergic to see if this will help.

## 2012-06-08 NOTE — Progress Notes (Signed)
  Subjective:    Patient ID: Taylor Burnett, female    DOB: 1951-06-22, 61 y.o.   MRN: [id number]  HPI The patient comes in today for followup of her known moderate to severe emphysema.  She has been staying compliant on symbicort, and feels that it has helped her breathing.  She has not had a recent acute exacerbation or pulmonary infection.  She has new complaints of orthopnea, as well as atypical chest pain that is primarily along her lower costal margin and in her low back.  She has not had pleuritic pain or lower extremity edema.  She has also had some increased mild cough, but it has been dry.   Review of Systems  Constitutional: Negative.  Negative for fever and unexpected weight change.  HENT: Negative for ear pain, nosebleeds, congestion, sore throat, rhinorrhea, sneezing, trouble swallowing, dental problem, postnasal drip and sinus pressure.   Eyes: Negative.  Negative for redness and itching.  Respiratory: Positive for cough and shortness of breath. Negative for chest tightness and wheezing.   Cardiovascular: Positive for chest pain. Negative for palpitations and leg swelling.  Gastrointestinal: Negative.  Negative for nausea and vomiting.  Genitourinary: Negative.  Negative for dysuria.  Musculoskeletal: Negative.  Negative for joint swelling.  Skin: Negative.  Negative for rash.  Neurological: Positive for headaches.  Hematological: Negative.  Does not bruise/bleed easily.  Psychiatric/Behavioral: Negative for dysphoric mood. The patient is nervous/anxious.        Objective:   Physical Exam Well-developed female in no acute distress Nose without purulence or discharge noted Oropharynx clear Chest with decreased breath sounds, no wheezes or rhonchi Cardiac exam with regular rate and rhythm Lower extremities without edema, no cyanosis Alert and oriented, moves all 4 extremities.       Assessment & Plan:

## 2012-06-08 NOTE — Patient Instructions (Addendum)
Stay on symbicort, and will add as a trial tudorza one inhalation am and pm Will check oxygen level overnight to make sure the level is not falling. Will check cxr today, and call you with results. Please call in 4 weeks to give me some feedback on the new inhaler, but if your chest discomfort worsens or doesn't improve, let us know.  If doing well, followup with me in 6mos.

## 2012-06-08 NOTE — Addendum Note (Signed)
Addended by: Michel Bickers A on: 06/08/2012 02:39 PM   Modules accepted: Orders

## 2012-07-01 ENCOUNTER — Telehealth: Payer: Self-pay | Admitting: Pulmonary Disease

## 2012-07-01 NOTE — Telephone Encounter (Signed)
Taylor Burnett, please let pt know that her oxygen level dropped just a little bit and just for seconds at a time during the night.  It really doesn't drop enough to require oxygen.

## 2012-07-05 NOTE — Telephone Encounter (Signed)
LMOMTCB x 1 

## 2012-07-06 NOTE — Telephone Encounter (Signed)
LMOMTCB x1 for pt 

## 2012-07-06 NOTE — Telephone Encounter (Signed)
Pt returned Lori's call & can be reached at [phone].  Taylor Burnett

## 2012-07-06 NOTE — Telephone Encounter (Signed)
Patient made aware and verbalized understanding of results.

## 2012-07-09 ENCOUNTER — Encounter: Payer: Self-pay | Admitting: Pulmonary Disease

## 2012-09-17 ENCOUNTER — Ambulatory Visit (INDEPENDENT_AMBULATORY_CARE_PROVIDER_SITE_OTHER): Payer: Medicaid Other | Admitting: Adult Health

## 2012-09-17 ENCOUNTER — Encounter: Payer: Self-pay | Admitting: Adult Health

## 2012-09-17 VITALS — BP 106/70 | HR 78 | Temp 97.4°F | Ht 62.0 in | Wt 126.4 lb

## 2012-09-17 DIAGNOSIS — J449 Chronic obstructive pulmonary disease, unspecified: Secondary | ICD-10-CM

## 2012-09-17 DIAGNOSIS — J4489 Other specified chronic obstructive pulmonary disease: Secondary | ICD-10-CM

## 2012-09-17 MED ORDER — AMOXICILLIN-POT CLAVULANATE 875-125 MG PO TABS: 1.0000 | ORAL_TABLET | Freq: Two times a day (BID) | ORAL | Status: AC

## 2012-09-17 MED ORDER — HYDROCODONE-HOMATROPINE 5-1.5 MG/5ML PO SYRP: 5.0000 mL | ORAL_SOLUTION | Freq: Four times a day (QID) | ORAL | Status: AC | PRN

## 2012-09-17 MED ORDER — LEVALBUTEROL HCL 0.63 MG/3ML IN NEBU
0.6300 mg | INHALATION_SOLUTION | Freq: Once | RESPIRATORY_TRACT | Status: AC
  Administered 2012-09-17: 0.63 mg via RESPIRATORY_TRACT

## 2012-09-17 NOTE — Assessment & Plan Note (Signed)
EXacerbation  Encouraged on smoking cessation  Unable to use steroids due to intolerances xopenex neb in office   Plan Augmentin 875mg  Twice daily  For 7days  Mucinex DM Twice daily  As needed  Cough  Fluids and rest  Hydromet 1-2 tsp every 4-6 hrs As needed   Please contact office for sooner follow up if symptoms do not improve or worsen or seek emergency care  Must work on not smoking .

## 2012-09-17 NOTE — Addendum Note (Signed)
Addended by: Boone Master E on: 09/17/2012 04:04 PM   Modules accepted: Orders

## 2012-09-17 NOTE — Progress Notes (Signed)
  Subjective:    Patient ID: Taylor Burnett, female    DOB: August 05, 1951, 61 y.o.   MRN: [id number]  HPI 61 yo female, active smoker,  with known hx of mod/severe emphysematous COPD   09/17/2012 Acute OV  Complains of prod cough with clear mucus, increased SOB, wheezing, tightness in chest x2 weeks, fever over the weekend.  SABA doesn't help Delsym not helping  Still smoking , encouraged on cessation  Initially with fever , none for last few days.  No hemoptysis .   Review of Systems Constitutional:   No  weight loss, night sweats,  Fevers, chills, + fatigue, or  lassitude.  HEENT:   No headaches,  Difficulty swallowing,  Tooth/dental problems, or  Sore throat,                No sneezing, itching, ear ache, + nasal congestion, post nasal drip,   CV:  No chest pain,  Orthopnea, PND, swelling in lower extremities, anasarca, dizziness, palpitations, syncope.   GI  No heartburn, indigestion, abdominal pain, nausea, vomiting, diarrhea, change in bowel habits, loss of appetite, bloody stools.   Resp:   No coughing up of blood.  No change in color of mucus.  No wheezing.  No chest wall deformity  Skin: no rash or lesions.  GU: no dysuria, change in color of urine, no urgency or frequency.  No flank pain, no hematuria   MS:  No joint pain or swelling.  No decreased range of motion.  No back pain.  Psych:  No change in mood or affect. No depression or anxiety.  No memory loss.         Objective:   Physical Exam GEN: A/Ox3; pleasant , NAD, well nourished   HEENT:  [place]/AT,  EACs-clear, TMs-wnl, NOSE-clear, THROAT-clear, no lesions, no postnasal drip or exudate noted.   NECK:  Supple w/ fair ROM; no JVD; normal carotid impulses w/o bruits; no thyromegaly or nodules palpated; no lymphadenopathy.  RESP  Coarse BS w/ no wheezing .no accessory muscle use, no dullness to percussion  CARD:  RRR, no m/r/g  , no peripheral edema, pulses intact, no cyanosis or clubbing.  GI:   Soft & nt;  nml bowel sounds; no organomegaly or masses detected.  Musco: Warm bil, no deformities or joint swelling noted.   Neuro: alert, no focal deficits noted.    Skin: Warm, no lesions or rashes         Assessment & Plan:

## 2012-09-17 NOTE — Patient Instructions (Addendum)
Augmentin 875mg  Twice daily  For 7days  Mucinex DM Twice daily  As needed  Cough  Fluids and rest  Hydromet 1-2 tsp every 4-6 hrs As needed   Please contact office for sooner follow up if symptoms do not improve or worsen or seek emergency care  Must work on not smoking .

## 2012-10-19 ENCOUNTER — Encounter: Payer: Self-pay | Admitting: Pulmonary Disease

## 2012-11-14 ENCOUNTER — Telehealth: Payer: Self-pay | Admitting: Pulmonary Disease

## 2012-11-14 MED ORDER — PREDNISONE 10 MG PO TABS: ORAL_TABLET | ORAL | Status: DC

## 2012-11-14 NOTE — Telephone Encounter (Signed)
She probably needs to go stay with family for a few days to let house air out completely. Ok to call in prednisone 10mg :  Take 4 each day for 2 days, then 3 each day for 2 days, then one each day for 2 days, then stop

## 2012-11-14 NOTE — Telephone Encounter (Signed)
Pt states that she was exposed to propane leaks about 2 days ago(was placing propane heater in home and had leaks) pt aired her house out really good but is still having nausea, lightheaded feelings, pain in lungs, SOB, wheezing, cough-non productive. Pt would like to know what to do next. KC please advise. Thanks.

## 2012-11-14 NOTE — Telephone Encounter (Signed)
Pt aware of pred taper sent and aware to try to leave home for a few day and let it air out.

## 2012-11-28 ENCOUNTER — Telehealth: Payer: Self-pay | Admitting: Pulmonary Disease

## 2012-11-28 MED ORDER — BUDESONIDE-FORMOTEROL FUMARATE 160-4.5 MCG/ACT IN AERO: 2.0000 | INHALATION_SPRAY | Freq: Two times a day (BID) | RESPIRATORY_TRACT | Status: DC

## 2012-11-28 NOTE — Telephone Encounter (Signed)
Refill sent and pt is aware. Jennifer Castillo, CMA  

## 2012-12-13 ENCOUNTER — Encounter: Payer: Self-pay | Admitting: Pulmonary Disease

## 2012-12-13 ENCOUNTER — Ambulatory Visit (INDEPENDENT_AMBULATORY_CARE_PROVIDER_SITE_OTHER): Payer: Medicaid Other | Admitting: Pulmonary Disease

## 2012-12-13 VITALS — BP 104/62 | HR 80 | Temp 97.7°F | Ht 62.0 in | Wt 131.8 lb

## 2012-12-13 DIAGNOSIS — J449 Chronic obstructive pulmonary disease, unspecified: Secondary | ICD-10-CM

## 2012-12-13 NOTE — Patient Instructions (Addendum)
Stay on symbicort twice a day. Stay as active as possible Stop smoking 100%.  You are almost there. followup with me in 6mos.

## 2012-12-13 NOTE — Progress Notes (Signed)
  Subjective:    Patient ID: Taylor Burnett, female    DOB: [birth year], 61 y.o.   MRN: [id number]  HPI The patient comes in today for followup of her known COPD.  She has cut way back on her smoking, and is down to one cigarette a day.  She is staying with symbicort, but did not see any change in her breathing with a trial of tudorza.  She denies any significant cough, congestion, or purulent mucus.  She has not had any lower extremity edema.   Review of Systems  Constitutional: Negative for fever and unexpected weight change.  HENT: Negative for ear pain, nosebleeds, congestion, sore throat, rhinorrhea, sneezing, trouble swallowing, dental problem, postnasal drip and sinus pressure.   Eyes: Negative for redness and itching.  Respiratory: Positive for chest tightness ( no worse than normal) and wheezing. Negative for cough and shortness of breath.   Cardiovascular: Positive for palpitations. Negative for leg swelling.  Gastrointestinal: Negative for nausea and vomiting.  Genitourinary: Negative for dysuria.  Musculoskeletal: Negative for joint swelling.  Skin: Negative for rash.  Neurological: Negative for headaches.  Hematological: Does not bruise/bleed easily.  Psychiatric/Behavioral: Negative for dysphoric mood. The patient is nervous/anxious.        Objective:   Physical Exam Well-developed female in no acute distress  nose without purulence or discharge noted Chest with decreased breath sounds, no wheezes or rhonchi Cardiac exam with regular rate and rhythm Lower extremities without edema, no cyanosis Alert and oriented, moves all 4 extremities.       Assessment & Plan:

## 2012-12-13 NOTE — Assessment & Plan Note (Signed)
The patient is maintaining a stable baseline on her current medications.  She is trying to stay as active as possible, and is down to one cigarette a day.  I have encouraged her to stop smoking 100%.  She did not see any change in her breathing with long-acting anticholinergics, and we'll therefore continue her on symbicort.  She is to followup with me in 6 months if doing well.

## 2012-12-14 ENCOUNTER — Ambulatory Visit: Payer: Medicaid Other | Admitting: Pulmonary Disease

## 2013-01-14 ENCOUNTER — Telehealth: Payer: Self-pay | Admitting: Pulmonary Disease

## 2013-01-14 MED ORDER — BUDESONIDE-FORMOTEROL FUMARATE 160-4.5 MCG/ACT IN AERO: 2.0000 | INHALATION_SPRAY | Freq: Two times a day (BID) | RESPIRATORY_TRACT | Status: DC

## 2013-01-14 NOTE — Telephone Encounter (Signed)
I spoke with pt and is aware rx was called in. Nothing further was needed

## 2013-01-14 NOTE — Telephone Encounter (Signed)
lmomtcb x1 for pt rx has been sent 

## 2013-01-14 NOTE — Telephone Encounter (Signed)
Returning call.Taylor Burnett ° °

## 2013-06-10 ENCOUNTER — Ambulatory Visit: Payer: Medicaid Other | Admitting: Pulmonary Disease

## 2013-06-24 ENCOUNTER — Ambulatory Visit (INDEPENDENT_AMBULATORY_CARE_PROVIDER_SITE_OTHER): Payer: Medicaid Other | Admitting: Pulmonary Disease

## 2013-06-24 ENCOUNTER — Encounter: Payer: Self-pay | Admitting: Pulmonary Disease

## 2013-06-24 VITALS — BP 102/70 | HR 77 | Temp 97.4°F | Ht 62.75 in | Wt 127.0 lb

## 2013-06-24 DIAGNOSIS — J449 Chronic obstructive pulmonary disease, unspecified: Secondary | ICD-10-CM

## 2013-06-24 NOTE — Progress Notes (Signed)
  Subjective:    Patient ID: Taylor Burnett, female    DOB: Feb 28, 1951, 61 y.o.   MRN: [id number]  HPI Patient comes in today for followup of her known COPD.  She has decreased her smoking down to just a few cigarettes a day, and is staying on her maintenance bronchodilator regimen.  She has not had an acute exacerbation or pulmonary infection since last visit, but is being bothered by the high humidity during the warmer summer months.  She denies any significant cough or mucus production.   Review of Systems  Constitutional: Negative for fever and unexpected weight change.  HENT: Negative for ear pain, nosebleeds, congestion, sore throat, rhinorrhea, sneezing, trouble swallowing, dental problem, postnasal drip and sinus pressure.   Eyes: Negative for redness and itching.  Respiratory: Positive for shortness of breath ( with increased humidity). Negative for cough, chest tightness and wheezing.   Cardiovascular: Negative for palpitations and leg swelling.  Gastrointestinal: Negative for nausea and vomiting.  Genitourinary: Negative for dysuria.  Musculoskeletal: Negative for joint swelling.  Skin: Negative for rash.  Neurological: Negative for headaches.  Hematological: Does not bruise/bleed easily.  Psychiatric/Behavioral: Negative for dysphoric mood. The patient is not nervous/anxious.        Objective:   Physical Exam Thin female in no acute distress Nose without purulence or discharge noted Neck without lymphadenopathy or thyromegaly Chest with very mild decrease in breath sounds, no wheezing Cardiac exam with regular rate and rhythm Lower extremities without edema, cyanosis Alert and oriented, moves all 4 extremities.       Assessment & Plan:

## 2013-06-24 NOTE — Patient Instructions (Addendum)
No change in medications Avoid the middle part of the day during the summer. Try to stay as active as possible. followup with me in 6mos.

## 2013-06-24 NOTE — Assessment & Plan Note (Signed)
The patient is maintaining a stable baseline on her current bronchodilator regimen.  I've asked her to stay on her symbicort daily, and work hard on total smoking cessation.  I've also asked her to be as active as possible during the cooler parts of the days.  She will follow up in 6 months if doing well.

## 2013-10-31 ENCOUNTER — Other Ambulatory Visit: Payer: Self-pay

## 2013-12-17 ENCOUNTER — Ambulatory Visit: Payer: Medicaid Other | Admitting: Pulmonary Disease

## 2013-12-23 ENCOUNTER — Telehealth: Payer: Self-pay | Admitting: Pulmonary Disease

## 2013-12-23 MED ORDER — LEVOFLOXACIN 500 MG PO TABS: 500.0000 mg | ORAL_TABLET | Freq: Every day | ORAL | Status: DC

## 2013-12-23 NOTE — Telephone Encounter (Signed)
Last OV 06/24/13, Dr Shelle Iron COPD pt. Pt is c/o having fever (unsure of how high) sore throat, weakness, body aches and chills, sinus congestion x 3 days. Pt states yesterday she begun to have a productive cough and at times the mucus was streaked with blood. Pt states other times it was a brown color. Pt denies any chest congestion, no PND, bloody nose, SOB, wheezing, chest tightness. No appts available this week. Please advise. Carron Curie, CMA Allergies  Allergen Reactions  . Prednisone     Depression and suicidal

## 2013-12-23 NOTE — Telephone Encounter (Signed)
I called and spoke with pt. Aware of recs and rx sent. appt scheduled. Nothing further needed

## 2013-12-23 NOTE — Telephone Encounter (Signed)
levaquin 500 mg daily x 7 days and hold asa until no blood x 3 days F/u one week with cxr - go to er in meantime if conditions worse

## 2013-12-24 ENCOUNTER — Ambulatory Visit: Payer: Medicaid Other | Admitting: Pulmonary Disease

## 2013-12-31 ENCOUNTER — Ambulatory Visit (INDEPENDENT_AMBULATORY_CARE_PROVIDER_SITE_OTHER): Payer: Medicaid Other | Admitting: Internal Medicine

## 2013-12-31 ENCOUNTER — Ambulatory Visit (INDEPENDENT_AMBULATORY_CARE_PROVIDER_SITE_OTHER)
Admission: RE | Admit: 2013-12-31 | Discharge: 2013-12-31 | Disposition: A | Discharge status: Home / Self Care | Payer: Medicaid Other | Source: Ambulatory Visit | Attending: Internal Medicine | Admitting: Internal Medicine

## 2013-12-31 ENCOUNTER — Encounter: Payer: Self-pay | Admitting: Internal Medicine

## 2013-12-31 VITALS — BP 122/74 | HR 68 | Temp 97.1°F | Ht 62.0 in | Wt 132.0 lb

## 2013-12-31 DIAGNOSIS — J449 Chronic obstructive pulmonary disease, unspecified: Secondary | ICD-10-CM

## 2013-12-31 DIAGNOSIS — R042 Hemoptysis: Secondary | ICD-10-CM

## 2013-12-31 DIAGNOSIS — J4489 Other specified chronic obstructive pulmonary disease: Secondary | ICD-10-CM

## 2013-12-31 NOTE — Progress Notes (Signed)
Subjective:     Patient ID: Taylor Burnett, female   DOB: 1951/05/16, 63 y.o.   MRN: [id number]  HPI  63 yowf pt of Dr Shelle Iron with   GOLD III 2012quit smoking 07/2013  no better since quit then started cough/ brown mucus xmas eve 2014 then bloody to maybe a half cup by 12/29 and rx over the phone with levaquin and rec d/c asa  12/31/2013 acute ov/Taylor Burnett re: hemoptysis Chief Complaint  Patient presents with  . Follow-up    Pt here to f/u on hemoptysis that started on 12/23/13- she states this persisted 3 more days and now sputum is white and foamy.  She has not had any more fever. She c/o feeling fatigued today.     maintained on symbicort 160 2bid and denies increase sob or  need for saba over baseline No epistaxis reported   No obvious day to day or daytime variabilty or assoc  cp or chest tightness, subjective wheeze overt sinus or hb symptoms. No unusual exp hx or h/o childhood pna/ asthma or knowledge of premature birth.  Sleeping ok without nocturnal  or early am exacerbation  of respiratory  c/o's or need for noct saba. Also denies any obvious fluctuation of symptoms with weather or environmental changes or other aggravating or alleviating factors except as outlined above   Current Medications, Allergies, Complete Past Medical History, Past Surgical History, Family History, and Social History were reviewed in [employer] record.  ROS  The following are not active complaints unless bolded sore throat, dysphagia, dental problems, itching, sneezing,  nasal congestion or excess/ purulent secretions, ear ache,   fever, chills, sweats, unintended wt loss, pleuritic or exertional cp, hemoptysis,  orthopnea pnd or leg swelling, presyncope, palpitations, heartburn, abdominal pain, anorexia, nausea, vomiting, diarrhea  or change in bowel or urinary habits, change in stools or urine, dysuria,hematuria,  rash, arthralgias, visual complaints, headache, numbness weakness or ataxia or  problems with walking or coordination,  change in mood/affect or memory.         Review of Systems     Objective:   Physical Exam   Wt Readings from Last 3 Encounters:  12/31/13 132 lb (59.875 kg)  06/24/13 127 lb (57.607 kg)  12/13/12 131 lb 12.8 oz (59.784 kg)       HEENT mild turbinate edema.  Oropharynx no thrush or excess pnd or cobblestoning.  No JVD or cervical adenopathy. Mild accessory muscle hypertrophy. Trachea midline, nl thryroid. Chest was hyperinflated by percussion with diminished breath sounds and moderate increased exp time without wheeze. Hoover sign positive at mid inspiration. Regular rate and rhythm without murmur gallop or rub or increase P2 or edema.  Abd: no hsm, nl excursion. Ext warm without cyanosis or clubbing.    CXR  12/31/2013 :  There is marked stable hyperinflation consistent with COPD. There is no evidence of pneumonia nor CHF nor other acute cardiopulmonary abnormality.     Assessment:

## 2013-12-31 NOTE — Patient Instructions (Signed)
Work on inhaler technique:  relax and gently blow all the way out then take a nice smooth deep breath back in, triggering the inhaler at same time you start breathing in.  Hold for up to 5 seconds if you can.  Rinse and gargle with water when done  Please remember to go to the x-ray department downstairs for your tests - we will call you with the results when they are available.  Change follow up with Dr Shelle Iron to 6 weeks

## 2014-01-01 DIAGNOSIS — R042 Hemoptysis: Secondary | ICD-10-CM | POA: Insufficient documentation

## 2014-01-01 NOTE — Assessment & Plan Note (Signed)
Taylor Burnett 09/2011:  FEV1 0.90 (39%), ratio 53 Unable to tolerate spiriva due to "mouth burning".  No change with trial of tudorza  The proper method of use, as well as anticipated side effects, of a metered-dose inhaler are discussed and demonstrated to the patient. Improved effectiveness after extensive coaching during this visit to a level of approximately  50% but no better, but note did poorly on tudorza and spiriva > re- eval in 6 weeks per Dr Shelle Iron

## 2014-01-01 NOTE — Assessment & Plan Note (Addendum)
Probably secondary to acute bronchitis already treated with levaquin and hold asa and restarted today s recurrence and nothing acute on cxr so advised no further w/u for now unless recurs but if so hold all asa until no bleeding at all x 3 days

## 2014-01-01 NOTE — Progress Notes (Signed)
Quick Note:  Spoke with pt and notified of results per Dr. Wert. Pt verbalized understanding and denied any questions.  ______ 

## 2014-01-13 ENCOUNTER — Ambulatory Visit: Payer: Medicaid Other | Admitting: Pulmonary Disease

## 2014-02-11 ENCOUNTER — Ambulatory Visit: Payer: Medicaid Other | Admitting: Pulmonary Disease

## 2014-02-14 ENCOUNTER — Ambulatory Visit: Payer: Medicaid Other | Admitting: Pulmonary Disease

## 2014-03-07 ENCOUNTER — Encounter: Payer: Self-pay | Admitting: Pulmonary Disease

## 2014-03-07 ENCOUNTER — Ambulatory Visit (INDEPENDENT_AMBULATORY_CARE_PROVIDER_SITE_OTHER): Payer: Medicaid Other | Admitting: Pulmonary Disease

## 2014-03-07 VITALS — BP 122/82 | HR 73 | Temp 97.8°F | Ht 62.5 in | Wt 139.4 lb

## 2014-03-07 DIAGNOSIS — R042 Hemoptysis: Secondary | ICD-10-CM

## 2014-03-07 DIAGNOSIS — J449 Chronic obstructive pulmonary disease, unspecified: Secondary | ICD-10-CM

## 2014-03-07 DIAGNOSIS — J4489 Other specified chronic obstructive pulmonary disease: Secondary | ICD-10-CM

## 2014-03-07 NOTE — Progress Notes (Signed)
   Subjective:    Patient ID: Taylor Burnett, female    DOB: [birth year], 63 y.o.   MRN: [id number]  HPI The patient comes in today for followup of her known COPD, and also to address a new issue with hemoptysis. She was seen by one of my partners in January with significant hemoptysis, and a chest x-ray was non-localizing. She was treated with a round of antibiotics and has to stop her aspirin, and had resolution of her symptoms. I proximally one month ago, she had another episode of bright red blood streaked mucus. The patient also feels that her breathing is not as good as it has been in the past on Symbicort, and she has even stopped smoking. She has been intolerant of Spiriva and tudorza in the past. She also has had complaints with chest tightness which leads to belching and significant improvement in her symptoms. She denies having reflux symptoms.   Review of Systems  Constitutional: Negative for fever and unexpected weight change.  HENT: Negative for congestion, dental problem, ear pain, nosebleeds, postnasal drip, rhinorrhea, sinus pressure, sneezing, sore throat and trouble swallowing.   Eyes: Negative for redness and itching.  Respiratory: Positive for wheezing. Negative for cough, chest tightness and shortness of breath.   Cardiovascular: Negative for palpitations and leg swelling.  Gastrointestinal: Negative for nausea and vomiting.       Incr belching and pressure in chest  Genitourinary: Negative for dysuria.  Musculoskeletal: Negative for joint swelling.  Skin: Negative for rash.  Neurological: Negative for headaches.  Hematological: Does not bruise/bleed easily.  Psychiatric/Behavioral: Negative for dysphoric mood. The patient is not nervous/anxious.        Objective:   Physical Exam Thin female in no acute distress Nose without purulence or discharge noted Oropharynx clear Neck without lymphadenopathy or thyromegaly Chest with mild decrease in breath sounds, no active  wheezing Cardiac exam with regular rate and rhythm Lower extremities without edema, no cyanosis Alert and oriented, moves all 4 extremities.       Assessment & Plan:

## 2014-03-07 NOTE — Assessment & Plan Note (Signed)
The patient does not feel the Symbicort has helped her much the last few months, despite quitting smoking. She is not on an anticholinergic therapy, and since she has stopped smoking, I want to try and get her off inhaled steroids. Will give her a trial of anoro to see if things improve. The patient has complaints of chest tightness, but improves with belching. This sounds most consistent with a GI source, and will try her on Nexium daily.

## 2014-03-07 NOTE — Patient Instructions (Signed)
Stop symbicort, and try anoro one inhalation each am for next 4 weeks.  Let me know what you think. Don't forget to rinse your mouth. Will schedule for scan of your chest to see if there is something to explain your blood in your mucus. Try nexium one each am for your GI symptoms.  If no better, would discuss with your primary MD.  If improves, would let your primary know and they can decide about keeping you on the medication.  followup with me in 34mos, but let me know how you do with the anoro.

## 2014-03-07 NOTE — Assessment & Plan Note (Signed)
The patient has had a recurrence of her hemoptysis from January, and I think at this point we need to do a CT chest given her long-standing smoking history. The patient is agreeable.

## 2014-03-13 ENCOUNTER — Ambulatory Visit (INDEPENDENT_AMBULATORY_CARE_PROVIDER_SITE_OTHER)
Admission: RE | Admit: 2014-03-13 | Discharge: 2014-03-13 | Disposition: A | Discharge status: Home / Self Care | Payer: Medicaid Other | Source: Ambulatory Visit | Attending: Pulmonary Disease | Admitting: Pulmonary Disease

## 2014-03-13 DIAGNOSIS — R042 Hemoptysis: Secondary | ICD-10-CM

## 2014-03-31 ENCOUNTER — Telehealth: Payer: Self-pay | Admitting: Pulmonary Disease

## 2014-03-31 MED ORDER — UMECLIDINIUM-VILANTEROL 62.5-25 MCG/INH IN AEPB: 1.0000 | INHALATION_SPRAY | RESPIRATORY_TRACT | Status: DC

## 2014-03-31 NOTE — Telephone Encounter (Signed)
Per OV 03/07/14: Stop symbicort, and try anoro one inhalation each am for next 4 weeks.  Let me know what you think. Don't forget to rinse your mouth. Will schedule for scan of your chest to see if there is something to explain your blood in your mucus. Try nexium one each am for your GI symptoms.  If no better, would discuss with your primary MD.  If improves, would let your primary know and they can decide about keeping you on the medication.   followup with me in 60mos, but let me know how you do with the anoro --  Called spoke with pt. She reports she feels the anoro has improved her breathing. Does feel like this works better than the symbicort. Asking for RX. Please advise KC thanks

## 2014-03-31 NOTE — Telephone Encounter (Signed)
Ok to send  with refills  

## 2014-03-31 NOTE — Telephone Encounter (Signed)
Anoro 1 po QAM #30 x 6 refills has been sent to [facility]. Pt aware.

## 2014-04-03 ENCOUNTER — Telehealth: Payer: Self-pay | Admitting: Pulmonary Disease

## 2014-04-03 NOTE — Telephone Encounter (Signed)
Called and spoke with the pt  She states that Anoro is needing PA  I called [facility] and requested PA inform  PA number [phone] id number [id number] Pt has tried and failed advair, spiriva, symbicort and tudorza  Med approved for 1 yr confirmation # [id number] Pt aware and pharmacy informed

## 2014-04-24 ENCOUNTER — Other Ambulatory Visit (HOSPITAL_COMMUNITY): Payer: Self-pay | Admitting: Family Medicine

## 2014-04-24 DIAGNOSIS — Z139 Encounter for screening, unspecified: Secondary | ICD-10-CM

## 2014-04-29 ENCOUNTER — Other Ambulatory Visit (HOSPITAL_COMMUNITY): Payer: Self-pay | Admitting: Family Medicine

## 2014-04-29 DIAGNOSIS — Z139 Encounter for screening, unspecified: Secondary | ICD-10-CM

## 2014-05-09 ENCOUNTER — Ambulatory Visit (HOSPITAL_COMMUNITY)
Admission: RE | Admit: 2014-05-09 | Discharge: 2014-05-09 | Disposition: A | Discharge status: Home / Self Care | Payer: Medicaid Other | Source: Ambulatory Visit | Attending: Family Medicine | Admitting: Family Medicine

## 2014-05-09 ENCOUNTER — Other Ambulatory Visit (HOSPITAL_COMMUNITY): Payer: Self-pay | Admitting: Family Medicine

## 2014-05-09 DIAGNOSIS — Z139 Encounter for screening, unspecified: Secondary | ICD-10-CM | POA: Insufficient documentation

## 2014-05-09 DIAGNOSIS — Z1231 Encounter for screening mammogram for malignant neoplasm of breast: Secondary | ICD-10-CM | POA: Insufficient documentation

## 2014-05-20 ENCOUNTER — Encounter: Payer: Self-pay | Admitting: Pulmonary Disease

## 2014-05-20 MED ORDER — ALBUTEROL SULFATE HFA 108 (90 BASE) MCG/ACT IN AERS: 2.0000 | INHALATION_SPRAY | Freq: Four times a day (QID) | RESPIRATORY_TRACT | Status: DC | PRN

## 2014-09-08 ENCOUNTER — Encounter: Payer: Self-pay | Admitting: Pulmonary Disease

## 2014-09-08 ENCOUNTER — Ambulatory Visit (INDEPENDENT_AMBULATORY_CARE_PROVIDER_SITE_OTHER): Payer: Medicaid Other | Admitting: Pulmonary Disease

## 2014-09-08 VITALS — BP 110/70 | HR 78 | Temp 97.9°F | Ht 63.0 in | Wt 133.8 lb

## 2014-09-08 DIAGNOSIS — J439 Emphysema, unspecified: Secondary | ICD-10-CM

## 2014-09-08 DIAGNOSIS — J438 Other emphysema: Secondary | ICD-10-CM

## 2014-09-08 NOTE — Patient Instructions (Signed)
Continue with anoro, and can use albuterol for rescue if needed Will refer to pulmonary rehab at [facility]. Try zyrtec 10mg  at bedtime for a few weeks for your cough. followup with me again in 2mos, but call if having worsening breathing issues.

## 2014-09-08 NOTE — Assessment & Plan Note (Signed)
The patient feels that her anoro has helped, but not significantly so. She has been tried on all the different bronchodilator combinations without significant change or poor tolerance. She has decided to stay on the anoro in order to help with acute exacerbations. I've also stressed to her the importance of a conditioning program, and she is willing to attend pulmonary rehabilitation in [place].  I have asked her to try and stay as active as possible in order to build her conditioning.

## 2014-09-08 NOTE — Progress Notes (Signed)
   Subjective:    Patient ID: Taylor Burnett, female    DOB: Jul 20, 1951, 63 y.o.   MRN: [id number]  HPI The patient comes in today for followup of her known severe COPD. She has not had any further hemoptysis since the last visit. She has been taking anoro, and although she feels it has helped to some degree, she doesn't feel that it has made a huge difference. I have explained to her that she has fairly severe COPD. She has not had a pulmonary infection or an acute exacerbation since the last visit. She has some increased cough, but it sounds more upper airway in origin.   Review of Systems  Constitutional: Negative for fever and unexpected weight change.  HENT: Negative for congestion, dental problem, ear pain, nosebleeds, postnasal drip, rhinorrhea, sinus pressure, sneezing, sore throat and trouble swallowing.   Eyes: Negative for redness and itching.  Respiratory: Positive for cough, chest tightness, shortness of breath and wheezing.   Cardiovascular: Negative for palpitations and leg swelling.  Gastrointestinal: Negative for nausea and vomiting.  Genitourinary: Negative for dysuria.  Musculoskeletal: Negative for joint swelling.  Skin: Negative for rash.  Neurological: Negative for headaches.  Hematological: Does not bruise/bleed easily.  Psychiatric/Behavioral: Negative for dysphoric mood. The patient is not nervous/anxious.        Objective:   Physical Exam Well-developed female in no acute distress Nose without purulence or discharge noted Neck without lymphadenopathy or thyromegaly Chest with decreased breath sounds throughout, no active wheezing Cardiac exam with regular rate and rhythm Lower extremities without edema, no cyanosis Alert and oriented, moves all 4 extremities.       Assessment & Plan:

## 2014-09-23 ENCOUNTER — Encounter (HOSPITAL_COMMUNITY): Payer: Medicaid Other

## 2014-10-10 ENCOUNTER — Other Ambulatory Visit: Payer: Self-pay

## 2014-11-28 ENCOUNTER — Telehealth: Payer: Self-pay | Admitting: Pulmonary Disease

## 2014-11-28 MED ORDER — UMECLIDINIUM-VILANTEROL 62.5-25 MCG/INH IN AEPB: 1.0000 | INHALATION_SPRAY | RESPIRATORY_TRACT | Status: DC

## 2014-11-28 NOTE — Telephone Encounter (Signed)
Pt needs anoro refill to [facility].  Nothing further needed.

## 2014-12-26 DIAGNOSIS — I219 Acute myocardial infarction, unspecified: Secondary | ICD-10-CM

## 2014-12-26 HISTORY — DX: Acute myocardial infarction, unspecified: I21.9

## 2015-02-16 ENCOUNTER — Other Ambulatory Visit: Payer: Self-pay | Admitting: Neurological Surgery

## 2015-02-25 ENCOUNTER — Ambulatory Visit (INDEPENDENT_AMBULATORY_CARE_PROVIDER_SITE_OTHER): Payer: Medicaid Other | Admitting: Pulmonary Disease

## 2015-02-25 ENCOUNTER — Encounter: Payer: Self-pay | Admitting: Pulmonary Disease

## 2015-02-25 VITALS — BP 120/76 | HR 77 | Temp 97.0°F | Ht 63.5 in | Wt 140.6 lb

## 2015-02-25 DIAGNOSIS — J438 Other emphysema: Secondary | ICD-10-CM

## 2015-02-25 NOTE — Progress Notes (Signed)
   Subjective:    Patient ID: Taylor Burnett, female    DOB: May 16, 1951, 64 y.o.   MRN: [id number]  HPI The patient comes in today for follow-up of her known severe COPD. She is maintaining her usual baseline on her current medications, and has not had a recent acute exacerbation or pulmonary infection.  She had been doing well on anoro, but does not feel it is working as well for her most recently. She has been tried on numerous other bronchodilator meds. She denies any significant cough or congestion. She is scheduled to have upcoming spine surgery the middle of the month, and needs surgical clearance.   Review of Systems  Constitutional: Negative for fever and unexpected weight change.  HENT: Positive for rhinorrhea. Negative for congestion, dental problem, ear pain, nosebleeds, postnasal drip, sinus pressure, sneezing, sore throat and trouble swallowing.   Eyes: Negative for redness and itching.  Respiratory: Positive for cough and shortness of breath. Negative for chest tightness and wheezing.   Cardiovascular: Negative for palpitations and leg swelling.  Gastrointestinal: Negative for nausea and vomiting.  Genitourinary: Negative for dysuria.  Musculoskeletal: Negative for joint swelling.  Skin: Negative for rash.  Neurological: Negative for headaches.  Hematological: Does not bruise/bleed easily.  Psychiatric/Behavioral: Negative for dysphoric mood. The patient is not nervous/anxious.        Objective:   Physical Exam Well-developed female in no acute distress Nose without purulence or discharge noted Neck without lymphadenopathy or thyromegaly Chest with decreased breath sounds, no active wheezing Cardiac exam with regular rate and rhythm Lower extremities without edema, no cyanosis Alert and oriented, moves all 4 extremities.       Assessment & Plan:

## 2015-02-25 NOTE — Patient Instructions (Signed)
No change in breathing medications for now, but can look at some other alternatives to treatment once you have recovered from your spine surgery You are at moderate risk for postop pulmonary complications with your surgery as we discussed, but I do not see a pulmonary reason you cannot have the surgery. followup with me after your surgery so we can discuss trying some different medications.

## 2015-02-25 NOTE — Assessment & Plan Note (Signed)
The patient feels that she is at her usual baseline, but does not think she is doing as well with the anoro as she initially did. She is starting to see breakthrough symptoms in the late afternoon/evening, and I wonder if she would do better with a twice a day medication. She has upcoming spine surgery in a few weeks, and therefore I would like to make no significant change to her regimen until after she has recovered. We can then talk about some different medications that she has yet tried. Regarding her risk for surgery, she does have severe COPD, and is at moderate risk for postop pulmonary complications such as pneumonia, respiratory failure.  I do not see any absolute contraindications to surgery from a pulmonary standpoint. I will send a note to her neurosurgeon regarding this.

## 2015-03-03 ENCOUNTER — Encounter (HOSPITAL_COMMUNITY)
Admission: RE | Admit: 2015-03-03 | Discharge: 2015-03-03 | Disposition: A | Discharge status: Home / Self Care | Payer: Medicaid Other | Source: Ambulatory Visit | Attending: Neurological Surgery | Admitting: Neurological Surgery

## 2015-03-03 ENCOUNTER — Encounter (HOSPITAL_COMMUNITY): Payer: Self-pay

## 2015-03-03 DIAGNOSIS — M4802 Spinal stenosis, cervical region: Secondary | ICD-10-CM

## 2015-03-03 DIAGNOSIS — M1990 Unspecified osteoarthritis, unspecified site: Secondary | ICD-10-CM | POA: Diagnosis not present

## 2015-03-03 DIAGNOSIS — J45909 Unspecified asthma, uncomplicated: Secondary | ICD-10-CM | POA: Diagnosis not present

## 2015-03-03 DIAGNOSIS — K219 Gastro-esophageal reflux disease without esophagitis: Secondary | ICD-10-CM | POA: Diagnosis not present

## 2015-03-03 DIAGNOSIS — J449 Chronic obstructive pulmonary disease, unspecified: Secondary | ICD-10-CM | POA: Diagnosis not present

## 2015-03-03 DIAGNOSIS — M5021 Other cervical disc displacement,  high cervical region: Secondary | ICD-10-CM | POA: Diagnosis not present

## 2015-03-03 DIAGNOSIS — M5022 Other cervical disc displacement, mid-cervical region: Secondary | ICD-10-CM | POA: Diagnosis not present

## 2015-03-03 DIAGNOSIS — Z87891 Personal history of nicotine dependence: Secondary | ICD-10-CM | POA: Diagnosis not present

## 2015-03-03 HISTORY — DX: Headache: R51

## 2015-03-03 HISTORY — DX: Cough, unspecified: R05.9

## 2015-03-03 HISTORY — DX: Reserved for inherently not codable concepts without codable children: IMO0001

## 2015-03-03 HISTORY — DX: Emphysema, unspecified: J43.9

## 2015-03-03 HISTORY — DX: Cardiac arrhythmia, unspecified: I49.9

## 2015-03-03 HISTORY — DX: Cough: R05

## 2015-03-03 HISTORY — DX: Gastro-esophageal reflux disease without esophagitis: K21.9

## 2015-03-03 HISTORY — DX: Headache, unspecified: R51.9

## 2015-03-03 LAB — CBC WITH DIFFERENTIAL/PLATELET
Basophils Absolute: 0 10*3/uL (ref 0.0–0.1)
Basophils Relative: 0 % (ref 0–1)
EOS PCT: 2 % (ref 0–5)
Eosinophils Absolute: 0.1 10*3/uL (ref 0.0–0.7)
HCT: 42.9 % (ref 36.0–46.0)
Hemoglobin: 14.4 g/dL (ref 12.0–15.0)
LYMPHS PCT: 39 % (ref 12–46)
Lymphs Abs: 2.1 10*3/uL (ref 0.7–4.0)
MCH: 30.8 pg (ref 26.0–34.0)
MCHC: 33.6 g/dL (ref 30.0–36.0)
MCV: 91.9 fL (ref 78.0–100.0)
Monocytes Absolute: 0.3 10*3/uL (ref 0.1–1.0)
Monocytes Relative: 6 % (ref 3–12)
NEUTROS PCT: 53 % (ref 43–77)
Neutro Abs: 2.8 10*3/uL (ref 1.7–7.7)
PLATELETS: 224 10*3/uL (ref 150–400)
RBC: 4.67 MIL/uL (ref 3.87–5.11)
RDW: 12.9 % (ref 11.5–15.5)
WBC: 5.3 10*3/uL (ref 4.0–10.5)

## 2015-03-03 LAB — PROTIME-INR
INR: 0.93 (ref 0.00–1.49)
Prothrombin Time: 12.5 seconds (ref 11.6–15.2)

## 2015-03-03 LAB — BASIC METABOLIC PANEL
ANION GAP: 4 — AB (ref 5–15)
BUN: 11 mg/dL (ref 6–23)
CO2: 30 mmol/L (ref 19–32)
CREATININE: 0.84 mg/dL (ref 0.50–1.10)
Calcium: 9.5 mg/dL (ref 8.4–10.5)
Chloride: 105 mmol/L (ref 96–112)
GFR calc Af Amer: 85 mL/min — ABNORMAL LOW (ref >90)
GFR calc non Af Amer: 73 mL/min — ABNORMAL LOW (ref >90)
Glucose, Bld: 97 mg/dL (ref 70–99)
Potassium: 3.9 mmol/L (ref 3.5–5.1)
SODIUM: 139 mmol/L (ref 135–145)

## 2015-03-03 LAB — SURGICAL PCR SCREEN
MRSA, PCR: NEGATIVE
Staphylococcus aureus: NEGATIVE

## 2015-03-03 NOTE — Pre-Procedure Instructions (Signed)
SHAWNAE SADOWSKI  03/03/2015   Your procedure is scheduled on:   Friday   03/13/15  Report to [facility] Admitting at 530 AM.  Call this number if you have problems the morning of surgery: [phone]   Remember:   Do not eat food or drink liquids after midnight.   Take these medicines the morning of surgery with A SIP OF WATER:  TYLENOL IF NEEDED, BREATHING TREATMENTS   Do not wear jewelry, make-up or nail polish.  Do not wear lotions, powders, or perfumes. You may wear deodorant.  Do not shave 48 hours prior to surgery. Men may shave face and neck.  Do not bring valuables to the hospital.  [facility] is not responsible                  for any belongings or valuables.               Contacts, dentures or bridgework may not be worn into surgery.  Leave suitcase in the car. After surgery it may be brought to your room.  For patients admitted to the hospital, discharge time is determined by your                treatment team.               Patients discharged the day of surgery will not be allowed to drive  home.  Name and phone number of your driver:   Special Instructions: [facility] - Preparing for Surgery  Before surgery, you can play an important role.  Because skin is not sterile, your skin needs to be as free of germs as possible.  You can reduce the number of germs on you skin by washing with CHG (chlorahexidine gluconate) soap before surgery.  CHG is an antiseptic cleaner which kills germs and bonds with the skin to continue killing germs even after washing.  Please DO NOT use if you have an allergy to CHG or antibacterial soaps.  If your skin becomes reddened/irritated stop using the CHG and inform your nurse when you arrive at Short Stay.  Do not shave (including legs and underarms) for at least 48 hours prior to the first CHG shower.  You may shave your face.  Please follow these instructions carefully:   1.  Shower with CHG Soap the night before surgery and the                                 morning of Surgery.  2.  If you choose to wash your hair, wash your hair first as usual with your       normal shampoo.  3.  After you shampoo, rinse your hair and body thoroughly to remove the                      Shampoo.  4.  Use CHG as you would any other liquid soap.  You can apply chg directly       to the skin and wash gently with scrungie or a clean washcloth.  5.  Apply the CHG Soap to your body ONLY FROM THE NECK DOWN.        Do not use on open wounds or open sores.  Avoid contact with your eyes,       ears, mouth and genitals (private parts).  Wash genitals (private parts)  with your normal soap.  6.  Wash thoroughly, paying special attention to the area where your surgery        will be performed.  7.  Thoroughly rinse your body with warm water from the neck down.  8.  DO NOT shower/wash with your normal soap after using and rinsing off       the CHG Soap.  9.  Pat yourself dry with a clean towel.            10.  Wear clean pajamas.            11.  Place clean sheets on your bed the night of your first shower and do not        sleep with pets.  Day of Surgery  Do not apply any lotions/deoderants the morning of surgery.  Please wear clean clothes to the hospital/surgery center.     Please read over the following fact sheets that you were given: Pain Booklet, Coughing and Deep Breathing, MRSA Information and Surgical Site Infection Prevention

## 2015-03-04 NOTE — Progress Notes (Signed)
Anesthesia Chart Review:  Patient is a 64 year old female scheduled for C3-4, C4-5, C5-6 level ACDF on 03/13/15 by Dr. Yetta Barre.  History includes former smoker, severe COPD/emphysema, GERD, panic attacks, occasional tachycardia, breast augmentation. PCP is listed as Bobbye Riggs, NP.  Pulmonologist is Dr. Marcelyn Bruins. Dr. Shelle Iron is aware of surgery plans and wrote on 02/25/15, "Regarding her risk for surgery, she does have severe COPD, and is at moderate risk for postop pulmonary complications such as pneumonia, respiratory failure. I do not see any absolute contraindications to surgery from a pulmonary standpoint. I will send a note to her neurosurgeon regarding this."  Meds include Tylenol, albuterol, Anoro Ellipta.   03/03/15 EKG: NSR at 71 bpm, anteroseptal infarct (age undetermined). The interpreting cardiologist did not feel that it was significantly changed previous tracing in 2012. History of normal stress test in 2006.  03/03/11 CXR: COPD without acute abnormality.  Preoperative labs noted.   If no acute cardiopulmonary issues prior to surgery then I would anticipate that she could proceed as planned.  Velna Ochs [facility] Short Stay Center/Anesthesiology Phone [phone] 03/04/2015 12:18 PM

## 2015-03-09 ENCOUNTER — Ambulatory Visit: Payer: Medicaid Other | Admitting: Pulmonary Disease

## 2015-03-12 MED ORDER — CEFAZOLIN SODIUM-DEXTROSE 2-3 GM-% IV SOLR
2.0000 g | INTRAVENOUS | Status: AC
  Administered 2015-03-13: 2 g via INTRAVENOUS
  Filled 2015-03-12: qty 50

## 2015-03-13 ENCOUNTER — Ambulatory Visit (HOSPITAL_COMMUNITY)
Admission: RE | Admit: 2015-03-13 | Discharge: 2015-03-14 | Disposition: A | Discharge status: Home / Self Care | Payer: Medicaid Other | Source: Ambulatory Visit | Attending: Neurological Surgery | Admitting: Neurological Surgery

## 2015-03-13 ENCOUNTER — Ambulatory Visit (HOSPITAL_COMMUNITY): Payer: Medicaid Other

## 2015-03-13 ENCOUNTER — Encounter (HOSPITAL_COMMUNITY)
Admission: RE | Disposition: A | Discharge status: Home / Self Care | Payer: Self-pay | Source: Ambulatory Visit | Attending: Neurological Surgery

## 2015-03-13 ENCOUNTER — Inpatient Hospital Stay (HOSPITAL_COMMUNITY): Payer: Medicaid Other | Admitting: Anesthesiology

## 2015-03-13 ENCOUNTER — Encounter (HOSPITAL_COMMUNITY): Payer: Self-pay | Admitting: Neurological Surgery

## 2015-03-13 ENCOUNTER — Inpatient Hospital Stay (HOSPITAL_COMMUNITY): Payer: Medicaid Other | Admitting: Vascular Surgery

## 2015-03-13 DIAGNOSIS — M5021 Other cervical disc displacement,  high cervical region: Secondary | ICD-10-CM | POA: Insufficient documentation

## 2015-03-13 DIAGNOSIS — J45909 Unspecified asthma, uncomplicated: Secondary | ICD-10-CM | POA: Diagnosis not present

## 2015-03-13 DIAGNOSIS — M5020 Other cervical disc displacement, unspecified cervical region: Secondary | ICD-10-CM

## 2015-03-13 DIAGNOSIS — M1990 Unspecified osteoarthritis, unspecified site: Secondary | ICD-10-CM | POA: Insufficient documentation

## 2015-03-13 DIAGNOSIS — M4802 Spinal stenosis, cervical region: Principal | ICD-10-CM | POA: Insufficient documentation

## 2015-03-13 DIAGNOSIS — M5022 Other cervical disc displacement, mid-cervical region: Secondary | ICD-10-CM | POA: Insufficient documentation

## 2015-03-13 DIAGNOSIS — J449 Chronic obstructive pulmonary disease, unspecified: Secondary | ICD-10-CM | POA: Insufficient documentation

## 2015-03-13 DIAGNOSIS — Z87891 Personal history of nicotine dependence: Secondary | ICD-10-CM | POA: Diagnosis not present

## 2015-03-13 DIAGNOSIS — K219 Gastro-esophageal reflux disease without esophagitis: Secondary | ICD-10-CM | POA: Insufficient documentation

## 2015-03-13 DIAGNOSIS — Z981 Arthrodesis status: Secondary | ICD-10-CM

## 2015-03-13 HISTORY — PX: ANTERIOR CERVICAL DECOMP/DISCECTOMY FUSION: SHX1161

## 2015-03-13 SURGERY — ANTERIOR CERVICAL DECOMPRESSION/DISCECTOMY FUSION 3 LEVELS
Anesthesia: General | Site: Spine Cervical

## 2015-03-13 MED ORDER — PHENOL 1.4 % MT LIQD: 1.0000 | OROMUCOSAL | Status: DC | PRN

## 2015-03-13 MED ORDER — DEXTROSE 5 % IV SOLN
500.0000 mg | Freq: Four times a day (QID) | INTRAVENOUS | Status: DC | PRN
  Filled 2015-03-13: qty 5

## 2015-03-13 MED ORDER — GLYCOPYRROLATE 0.2 MG/ML IJ SOLN
INTRAMUSCULAR | Status: DC | PRN
  Administered 2015-03-13: .5 mg via INTRAVENOUS

## 2015-03-13 MED ORDER — 0.9 % SODIUM CHLORIDE (POUR BTL) OPTIME
TOPICAL | Status: DC | PRN
  Administered 2015-03-13: 1000 mL

## 2015-03-13 MED ORDER — ACETAMINOPHEN 650 MG RE SUPP: 650.0000 mg | RECTAL | Status: DC | PRN

## 2015-03-13 MED ORDER — GLYCOPYRROLATE 0.2 MG/ML IJ SOLN
INTRAMUSCULAR | Status: AC
  Filled 2015-03-13: qty 2

## 2015-03-13 MED ORDER — MIDAZOLAM HCL 5 MG/5ML IJ SOLN
INTRAMUSCULAR | Status: DC | PRN
  Administered 2015-03-13: 2 mg via INTRAVENOUS

## 2015-03-13 MED ORDER — ONDANSETRON HCL 4 MG/2ML IJ SOLN: 4.0000 mg | INTRAMUSCULAR | Status: DC | PRN

## 2015-03-13 MED ORDER — ARTIFICIAL TEARS OP OINT
TOPICAL_OINTMENT | OPHTHALMIC | Status: DC | PRN
Start: 2015-03-13 — End: 2015-03-13
  Administered 2015-03-13 (×2): 1 via OPHTHALMIC

## 2015-03-13 MED ORDER — HYDROCODONE-ACETAMINOPHEN 5-325 MG PO TABS
1.0000 | ORAL_TABLET | ORAL | Status: DC | PRN
  Administered 2015-03-13 – 2015-03-14 (×4): 2 via ORAL
  Filled 2015-03-13 (×4): qty 2

## 2015-03-13 MED ORDER — MEPERIDINE HCL 25 MG/ML IJ SOLN: 6.2500 mg | INTRAMUSCULAR | Status: DC | PRN

## 2015-03-13 MED ORDER — THROMBIN 5000 UNITS EX SOLR
CUTANEOUS | Status: DC | PRN
  Administered 2015-03-13: 08:00:00 via TOPICAL

## 2015-03-13 MED ORDER — NEOSTIGMINE METHYLSULFATE 10 MG/10ML IV SOLN
INTRAVENOUS | Status: AC
  Filled 2015-03-13: qty 1

## 2015-03-13 MED ORDER — SODIUM CHLORIDE 0.9 % IJ SOLN
3.0000 mL | INTRAMUSCULAR | Status: DC | PRN
Start: 2015-03-13 — End: 2015-03-14

## 2015-03-13 MED ORDER — THROMBIN 20000 UNITS EX SOLR
CUTANEOUS | Status: DC | PRN
  Administered 2015-03-13: 07:00:00 via TOPICAL

## 2015-03-13 MED ORDER — DEXAMETHASONE SODIUM PHOSPHATE 4 MG/ML IJ SOLN
INTRAMUSCULAR | Status: DC | PRN
  Administered 2015-03-13: 4 mg via INTRAVENOUS

## 2015-03-13 MED ORDER — SODIUM CHLORIDE 0.9 % IV SOLN: 250.0000 mL | INTRAVENOUS | Status: DC

## 2015-03-13 MED ORDER — UMECLIDINIUM-VILANTEROL 62.5-25 MCG/INH IN AEPB: 1.0000 | INHALATION_SPRAY | Freq: Every day | RESPIRATORY_TRACT | Status: DC

## 2015-03-13 MED ORDER — STERILE WATER FOR INJECTION IJ SOLN
INTRAMUSCULAR | Status: AC
  Filled 2015-03-13: qty 10

## 2015-03-13 MED ORDER — SODIUM CHLORIDE 0.9 % IJ SOLN: 3.0000 mL | Freq: Two times a day (BID) | INTRAMUSCULAR | Status: DC

## 2015-03-13 MED ORDER — ACETAMINOPHEN 325 MG PO TABS: 650.0000 mg | ORAL_TABLET | ORAL | Status: DC | PRN

## 2015-03-13 MED ORDER — DEXAMETHASONE SODIUM PHOSPHATE 4 MG/ML IJ SOLN
INTRAMUSCULAR | Status: AC
  Filled 2015-03-13: qty 1

## 2015-03-13 MED ORDER — MIDAZOLAM HCL 2 MG/2ML IJ SOLN
INTRAMUSCULAR | Status: AC
  Filled 2015-03-13: qty 2

## 2015-03-13 MED ORDER — MORPHINE SULFATE 2 MG/ML IJ SOLN: 1.0000 mg | INTRAMUSCULAR | Status: DC | PRN

## 2015-03-13 MED ORDER — ONDANSETRON HCL 4 MG/2ML IJ SOLN
INTRAMUSCULAR | Status: AC
  Filled 2015-03-13: qty 2

## 2015-03-13 MED ORDER — LIDOCAINE HCL (CARDIAC) 20 MG/ML IV SOLN
INTRAVENOUS | Status: DC | PRN
  Administered 2015-03-13: 60 mg via INTRATRACHEAL
  Administered 2015-03-13: 100 mg via INTRAVENOUS

## 2015-03-13 MED ORDER — FENTANYL CITRATE 0.05 MG/ML IJ SOLN: 25.0000 ug | INTRAMUSCULAR | Status: DC | PRN

## 2015-03-13 MED ORDER — VECURONIUM BROMIDE 10 MG IV SOLR
INTRAVENOUS | Status: AC
  Filled 2015-03-13: qty 10

## 2015-03-13 MED ORDER — PROPOFOL 10 MG/ML IV BOLUS
INTRAVENOUS | Status: AC
  Filled 2015-03-13: qty 20

## 2015-03-13 MED ORDER — GLYCOPYRROLATE 0.2 MG/ML IJ SOLN
INTRAMUSCULAR | Status: AC
  Filled 2015-03-13: qty 1

## 2015-03-13 MED ORDER — POTASSIUM CHLORIDE IN NACL 20-0.9 MEQ/L-% IV SOLN
INTRAVENOUS | Status: DC
  Filled 2015-03-13 (×3): qty 1000

## 2015-03-13 MED ORDER — BUPIVACAINE HCL (PF) 0.25 % IJ SOLN
INTRAMUSCULAR | Status: DC | PRN
  Administered 2015-03-13: 2 mL

## 2015-03-13 MED ORDER — PROPOFOL 10 MG/ML IV BOLUS
INTRAVENOUS | Status: DC | PRN
  Administered 2015-03-13: 140 mg via INTRAVENOUS

## 2015-03-13 MED ORDER — LACTATED RINGERS IV SOLN
INTRAVENOUS | Status: DC | PRN
  Administered 2015-03-13 (×2): via INTRAVENOUS

## 2015-03-13 MED ORDER — METHOCARBAMOL 500 MG PO TABS
500.0000 mg | ORAL_TABLET | Freq: Four times a day (QID) | ORAL | Status: DC | PRN
Start: 2015-03-13 — End: 2015-03-14
  Administered 2015-03-13 – 2015-03-14 (×3): 500 mg via ORAL
  Filled 2015-03-13 (×3): qty 1

## 2015-03-13 MED ORDER — DIPHENHYDRAMINE HCL 50 MG/ML IJ SOLN
INTRAMUSCULAR | Status: AC
  Filled 2015-03-13: qty 1

## 2015-03-13 MED ORDER — ARTIFICIAL TEARS OP OINT
TOPICAL_OINTMENT | OPHTHALMIC | Status: AC
  Filled 2015-03-13: qty 3.5

## 2015-03-13 MED ORDER — ROCURONIUM BROMIDE 50 MG/5ML IV SOLN
INTRAVENOUS | Status: AC
  Filled 2015-03-13: qty 1

## 2015-03-13 MED ORDER — PROMETHAZINE HCL 25 MG/ML IJ SOLN: 6.2500 mg | INTRAMUSCULAR | Status: DC | PRN

## 2015-03-13 MED ORDER — CEFAZOLIN SODIUM 1-5 GM-% IV SOLN
1.0000 g | Freq: Three times a day (TID) | INTRAVENOUS | Status: AC
  Administered 2015-03-13: 1 g via INTRAVENOUS
  Filled 2015-03-13 (×2): qty 50

## 2015-03-13 MED ORDER — VECURONIUM BROMIDE 10 MG IV SOLR
INTRAVENOUS | Status: DC | PRN
  Administered 2015-03-13 (×2): 2 mg via INTRAVENOUS

## 2015-03-13 MED ORDER — FENTANYL CITRATE 0.05 MG/ML IJ SOLN
INTRAMUSCULAR | Status: AC
  Filled 2015-03-13: qty 5

## 2015-03-13 MED ORDER — ONDANSETRON HCL 4 MG/2ML IJ SOLN
INTRAMUSCULAR | Status: DC | PRN
  Administered 2015-03-13: 4 mg via INTRAVENOUS

## 2015-03-13 MED ORDER — DIPHENHYDRAMINE HCL 50 MG/ML IJ SOLN
INTRAMUSCULAR | Status: DC | PRN
  Administered 2015-03-13: 25 mg via INTRAVENOUS

## 2015-03-13 MED ORDER — ROCURONIUM BROMIDE 100 MG/10ML IV SOLN
INTRAVENOUS | Status: DC | PRN
  Administered 2015-03-13: 50 mg via INTRAVENOUS

## 2015-03-13 MED ORDER — MENTHOL 3 MG MT LOZG: 1.0000 | LOZENGE | OROMUCOSAL | Status: DC | PRN

## 2015-03-13 MED ORDER — NEOSTIGMINE METHYLSULFATE 10 MG/10ML IV SOLN
INTRAVENOUS | Status: DC | PRN
  Administered 2015-03-13: 3 mg via INTRAVENOUS

## 2015-03-13 MED ORDER — FENTANYL CITRATE 0.05 MG/ML IJ SOLN
INTRAMUSCULAR | Status: DC | PRN
  Administered 2015-03-13: 50 ug via INTRAVENOUS
  Administered 2015-03-13: 100 ug via INTRAVENOUS
  Administered 2015-03-13: 50 ug via INTRAVENOUS
  Administered 2015-03-13: 100 ug via INTRAVENOUS
  Administered 2015-03-13: 50 ug via INTRAVENOUS

## 2015-03-13 MED ORDER — SODIUM CHLORIDE 0.9 % IR SOLN
Status: DC | PRN
  Administered 2015-03-13: 07:00:00

## 2015-03-13 MED ORDER — ALBUTEROL SULFATE (2.5 MG/3ML) 0.083% IN NEBU: 3.0000 mL | INHALATION_SOLUTION | Freq: Four times a day (QID) | RESPIRATORY_TRACT | Status: DC | PRN

## 2015-03-13 MED ORDER — LIDOCAINE HCL (CARDIAC) 20 MG/ML IV SOLN
INTRAVENOUS | Status: AC
  Filled 2015-03-13: qty 10

## 2015-03-13 SURGICAL SUPPLY — 58 items
APL SKNCLS STERI-STRIP NONHPOA (GAUZE/BANDAGES/DRESSINGS) ×1
BAG DECANTER FOR FLEXI CONT (MISCELLANEOUS) ×3 IMPLANT
BENZOIN TINCTURE PRP APPL 2/3 (GAUZE/BANDAGES/DRESSINGS) ×3 IMPLANT
BIT DRILL POWER (BIT) IMPLANT
BONE MATRIX OSTEOCEL PRO SM (Bone Implant) ×2 IMPLANT
BUR MATCHSTICK NEURO 3.0 LAGG (BURR) ×3 IMPLANT
CAGE COROENT SM 6X13X15 (Cage) ×6 IMPLANT
CANISTER SUCT 3000ML PPV (MISCELLANEOUS) ×3 IMPLANT
CLOSURE WOUND 1/2 X4 (GAUZE/BANDAGES/DRESSINGS) ×1
CONT SPEC 4OZ CLIKSEAL STRL BL (MISCELLANEOUS) ×3 IMPLANT
DRAPE C-ARM 42X72 X-RAY (DRAPES) ×6 IMPLANT
DRAPE LAPAROTOMY 100X72 PEDS (DRAPES) ×3 IMPLANT
DRAPE MICROSCOPE LEICA (MISCELLANEOUS) ×3 IMPLANT
DRAPE POUCH INSTRU U-SHP 10X18 (DRAPES) ×3 IMPLANT
DRILL BIT POWER (BIT) ×3
DRSG OPSITE 4X5.5 SM (GAUZE/BANDAGES/DRESSINGS) ×3 IMPLANT
DRSG OPSITE POSTOP 3X4 (GAUZE/BANDAGES/DRESSINGS) ×2 IMPLANT
DRSG TELFA 3X8 NADH (GAUZE/BANDAGES/DRESSINGS) ×3 IMPLANT
DURAPREP 6ML APPLICATOR 50/CS (WOUND CARE) ×3 IMPLANT
ELECT COATED BLADE 2.86 ST (ELECTRODE) ×3 IMPLANT
ELECT REM PT RETURN 9FT ADLT (ELECTROSURGICAL) ×3
ELECTRODE REM PT RTRN 9FT ADLT (ELECTROSURGICAL) ×1 IMPLANT
GAUZE SPONGE 4X4 16PLY XRAY LF (GAUZE/BANDAGES/DRESSINGS) IMPLANT
GLOVE BIO SURGEON STRL SZ7.5 (GLOVE) ×2 IMPLANT
GLOVE BIO SURGEON STRL SZ8 (GLOVE) ×3 IMPLANT
GLOVE ECLIPSE 6.5 STRL STRAW (GLOVE) ×4 IMPLANT
GLOVE INDICATOR 7.0 STRL GRN (GLOVE) ×2 IMPLANT
GLOVE INDICATOR 7.5 STRL GRN (GLOVE) ×2 IMPLANT
GOWN STRL REUS W/ TWL LRG LVL3 (GOWN DISPOSABLE) IMPLANT
GOWN STRL REUS W/ TWL XL LVL3 (GOWN DISPOSABLE) ×1 IMPLANT
GOWN STRL REUS W/TWL 2XL LVL3 (GOWN DISPOSABLE) IMPLANT
GOWN STRL REUS W/TWL LRG LVL3 (GOWN DISPOSABLE) ×6
GOWN STRL REUS W/TWL XL LVL3 (GOWN DISPOSABLE) ×3
HALTER HD/CHIN CERV TRACTION D (MISCELLANEOUS) IMPLANT
HEMOSTAT POWDER KIT SURGIFOAM (HEMOSTASIS) ×3 IMPLANT
KIT BASIN OR (CUSTOM PROCEDURE TRAY) ×3 IMPLANT
KIT ROOM TURNOVER OR (KITS) ×3 IMPLANT
NDL HYPO 25X1 1.5 SAFETY (NEEDLE) ×1 IMPLANT
NDL SPNL 20GX3.5 QUINCKE YW (NEEDLE) ×1 IMPLANT
NEEDLE HYPO 25X1 1.5 SAFETY (NEEDLE) ×3 IMPLANT
NEEDLE SPNL 20GX3.5 QUINCKE YW (NEEDLE) ×3 IMPLANT
NS IRRIG 1000ML POUR BTL (IV SOLUTION) ×3 IMPLANT
PACK LAMINECTOMY NEURO (CUSTOM PROCEDURE TRAY) ×3 IMPLANT
PAD ARMBOARD 7.5X6 YLW CONV (MISCELLANEOUS) ×3 IMPLANT
PAD DRESSING TELFA 3X8 NADH (GAUZE/BANDAGES/DRESSINGS) ×1 IMPLANT
PLATE HELIX T 54 (Plate) IMPLANT
PLATE HELIX T 54MM (Plate) ×3 IMPLANT
RUBBERBAND STERILE (MISCELLANEOUS) ×6 IMPLANT
SCREW FIXED SELF TAP 4.0X13MM (Screw) ×16 IMPLANT
SPONGE INTESTINAL PEANUT (DISPOSABLE) ×3 IMPLANT
SPONGE SURGIFOAM ABS GEL 100 (HEMOSTASIS) ×3 IMPLANT
STRIP CLOSURE SKIN 1/2X4 (GAUZE/BANDAGES/DRESSINGS) ×2 IMPLANT
SUT VIC AB 3-0 SH 8-18 (SUTURE) ×3 IMPLANT
SYR 20ML ECCENTRIC (SYRINGE) ×2 IMPLANT
TOWEL OR 17X24 6PK STRL BLUE (TOWEL DISPOSABLE) ×3 IMPLANT
TOWEL OR 17X26 10 PK STRL BLUE (TOWEL DISPOSABLE) ×3 IMPLANT
TRAP SPECIMEN MUCOUS 40CC (MISCELLANEOUS) ×2 IMPLANT
WATER STERILE IRR 1000ML POUR (IV SOLUTION) ×3 IMPLANT

## 2015-03-13 NOTE — Progress Notes (Signed)
Patient looks good postoperatively. She has some numbness in the fingers of the right hand. None on the left. Denies weakness. Moves all extremities well. Dressing is dry and voice is strong. I suspect the numbness in the fingers is from neural irritation from the surgery. This should improve with time if so. I have tried to reassure her.

## 2015-03-13 NOTE — H&P (Signed)
Subjective:   Patient is a 64 y.o. female admitted for ACDF. The patient first presented to me with complaints of neck pain, shooting pains in the arm(s) and numbness of the arm(s). Onset of symptoms was a few months ago. The pain is described as aching and occurs all day. The pain is rated severe, and is located  In the neck and radiates to the arms. The symptoms have been progressive. Symptoms are exacerbated by extending head backwards, and are relieved by none.  Previous work up includes MRI of cervical spine, results: spinal stenosis.  Past Medical History  Diagnosis Date  . COPD (chronic obstructive pulmonary disease)   . Arthritis   . Panic attacks   . Dysrhythmia     RAPID HEART BEAT OCC   . Emphysema of lung   . Shortness of breath dyspnea   . Cough   . GERD (gastroesophageal reflux disease)     OCC  . Headache     Past Surgical History  Procedure Laterality Date  . Tonsillectomy    . Tubal ligation    . Breast enhancement surgery  1990    Allergies  Allergen Reactions  . Prednisone     Depression and suicidal    History  Substance Use Topics  . Smoking status: Former Smoker -- 2.00 packs/day for 40 years    Types: Cigarettes    Quit date: 07/31/2013  . Smokeless tobacco: Not on file  . Alcohol Use: Yes     Comment: OCC WINE     Family History  Problem Relation Age of Onset  . Emphysema Father   . Allergies Mother   . Allergies Son   . Allergies Brother   . Heart disease Maternal Grandmother   . Rheum arthritis Brother   . Cancer      aunts and uncles   Prior to Admission medications   Medication Sig Start Date End Date Taking? Authorizing Provider  acetaminophen (TYLENOL) 500 MG tablet Take 1,000 mg by mouth every 6 (six) hours as needed. For pain/fever.    Yes Historical Provider, MD  albuterol (VENTOLIN HFA) 108 (90 BASE) MCG/ACT inhaler Inhale 2 puffs into the lungs every 6 (six) hours as needed. 05/20/14  Yes Barbaraann Share, MD   Umeclidinium-Vilanterol (ANORO ELLIPTA) 62.5-25 MCG/INH AEPB Inhale 1 puff into the lungs every morning. 11/28/14  Yes Barbaraann Share, MD     Review of Systems  Positive ROS: neg  All other systems have been reviewed and were otherwise negative with the exception of those mentioned in the HPI and as above.  Objective: Vital signs in last 24 hours:    General Appearance: Alert, cooperative, no distress, appears stated age Head: Normocephalic, without obvious abnormality, atraumatic Eyes: PERRL, conjunctiva/corneas clear, EOM's intact      Neck: Supple, symmetrical, trachea midline, Back: Symmetric, no curvature, ROM normal, no CVA tenderness Lungs:  respirations unlabored Heart: Regular rate and rhythm Abdomen: Soft, non-tender Extremities: Extremities normal, atraumatic, no cyanosis or edema Pulses: 2+ and symmetric all extremities Skin: Skin color, texture, turgor normal, no rashes or lesions  NEUROLOGIC:  Mental status: Alert and oriented x4, no aphasia, good attention span, fund of knowledge and memory  Motor Exam - grossly normal Sensory Exam - grossly normal Reflexes: 1+ Coordination - grossly normal Gait - grossly normal Balance - grossly normal Cranial Nerves: I: smell Not tested  II: visual acuity  OS: nl    OD: nl  II: visual fields Full to confrontation  II: pupils Equal, round, reactive to light  III,VII: ptosis None  III,IV,VI: extraocular muscles  Full ROM  V: mastication Normal  V: facial light touch sensation  Normal  V,VII: corneal reflex  Present  VII: facial muscle function - upper  Normal  VII: facial muscle function - lower Normal  VIII: hearing Not tested  IX: soft palate elevation  Normal  IX,X: gag reflex Present  XI: trapezius strength  5/5  XI: sternocleidomastoid strength 5/5  XI: neck flexion strength  5/5  XII: tongue strength  Normal    Data Review Lab Results  Component Value Date   WBC 5.3 03/03/2015   HGB 14.4 03/03/2015    HCT 42.9 03/03/2015   MCV 91.9 03/03/2015   PLT 224 03/03/2015   Lab Results  Component Value Date   NA 139 03/03/2015   K 3.9 03/03/2015   CL 105 03/03/2015   CO2 30 03/03/2015   BUN 11 03/03/2015   CREATININE 0.84 03/03/2015   GLUCOSE 97 03/03/2015   Lab Results  Component Value Date   INR 0.93 03/03/2015    Assessment:   Cervical neck pain with herniated nucleus pulposus/ spondylosis/ stenosis at C3-4, C4-5, C5-6. Patient has failed conservative therapy. Planned surgery : ACDF with plate B5-5, H7-4, C5-6  Plan:   I explained the condition and procedure to the patient and answered any questions.  Patient wishes to proceed with procedure as planned. Understands risks/ benefits/ and expected or typical outcomes.  Amogh Komatsu S 03/13/2015 2:03 AM

## 2015-03-13 NOTE — Anesthesia Postprocedure Evaluation (Signed)
  Anesthesia Post-op Note  Patient: Taylor Burnett  Procedure(s) Performed: Procedure(s): CERVICAL THREE-CERVICAL FOUR, CERVICAL FOUR-CERVICAL FIVE, CERVICAL FIVE-CERVICAL SIX ANTERIOR CERVICAL DECOMPRESSION/DISCECTOMY FUSION 3 LEVELS (N/A)  Patient Location: PACU  Anesthesia Type:General  Level of Consciousness: awake and alert   Airway and Oxygen Therapy: Patient Spontanous Breathing and Patient connected to nasal cannula oxygen  Post-op Pain: mild  Post-op Assessment: Post-op Vital signs reviewed and Patient's Cardiovascular Status Stable  Post-op Vital Signs: Reviewed and stable  Last Vitals:  Filed Vitals:   03/13/15 1104  BP: 95/53  Pulse: 71  Temp:   Resp: 4    Complications: No apparent anesthesia complications

## 2015-03-13 NOTE — Anesthesia Procedure Notes (Signed)
Procedure Name: Intubation Date/Time: 03/13/2015 7:27 AM Performed by: [place] Pink Pre-anesthesia Checklist: Patient identified, Emergency Drugs available, Suction available, Patient being monitored and Timeout performed Patient Re-evaluated:Patient Re-evaluated prior to inductionOxygen Delivery Method: Circle system utilized Preoxygenation: Pre-oxygenation with 100% oxygen Intubation Type: IV induction Ventilation: Mask ventilation without difficulty Laryngoscope Size: Mac and 3 Grade View: Grade III Tube type: Oral Tube size: 7.0 mm Number of attempts: 2 Airway Equipment and Method: Stylet and LTA kit utilized Placement Confirmation: ETT inserted through vocal cords under direct vision,  positive ETCO2 and breath sounds checked- equal and bilateral Secured at: 21 cm Tube secured with: Tape Dental Injury: Teeth and Oropharynx as per pre-operative assessment

## 2015-03-13 NOTE — Anesthesia Preprocedure Evaluation (Addendum)
Anesthesia Evaluation  Patient identified by MRN, date of birth, ID band Patient awake    Reviewed: Allergy & Precautions, NPO status , Patient's Chart, lab work & pertinent test results  Airway Mallampati: II   Neck ROM: Full    Dental  (+) Teeth Intact, Dental Advisory Given   Pulmonary asthma , COPDformer smoker (quit 2014 80 pack year),  breath sounds clear to auscultation        Cardiovascular Rhythm:Regular     Neuro/Psych Anxiety    GI/Hepatic Neg liver ROS, GERD-  Medicated,  Endo/Other  negative endocrine ROS  Renal/GU negative Renal ROS     Musculoskeletal   Abdominal (+)  Abdomen: soft.    Peds  Hematology 14/43   Anesthesia Other Findings   Reproductive/Obstetrics                            Anesthesia Physical Anesthesia Plan  ASA: III  Anesthesia Plan: General   Post-op Pain Management:    Induction: Intravenous  Airway Management Planned: Oral ETT  Additional Equipment:   Intra-op Plan:   Post-operative Plan:   Informed Consent: I have reviewed the patients History and Physical, chart, labs and discussed the procedure including the risks, benefits and alternatives for the proposed anesthesia with the patient or authorized representative who has indicated his/her understanding and acceptance.     Plan Discussed with:   Anesthesia Plan Comments: (COPD, Multimodal pain RX)        Anesthesia Quick Evaluation

## 2015-03-13 NOTE — Transfer of Care (Signed)
Immediate Anesthesia Transfer of Care Note  Patient: Taylor Burnett  Procedure(s) Performed: Procedure(s): CERVICAL THREE-CERVICAL FOUR, CERVICAL FOUR-CERVICAL FIVE, CERVICAL FIVE-CERVICAL SIX ANTERIOR CERVICAL DECOMPRESSION/DISCECTOMY FUSION 3 LEVELS (N/A)  Patient Location: PACU  Anesthesia Type:General  Level of Consciousness: awake and alert   Airway & Oxygen Therapy: Patient Spontanous Breathing and Patient connected to nasal cannula oxygen  Post-op Assessment: Report given to RN and Post -op Vital signs reviewed and stable  Post vital signs: Reviewed and stable  Last Vitals:  Filed Vitals:   03/13/15 1059  BP:   Pulse: 72  Temp: 36.7 C  Resp: 16    Complications: No apparent anesthesia complications

## 2015-03-13 NOTE — Progress Notes (Signed)
This note also relates to the following rows which could not be included: Pulse Rate - Cannot attach notes to unvalidated device data ECG Heart Rate - Cannot attach notes to unvalidated device data BP - Cannot attach notes to unvalidated device data   Apneic, dropped sats as noted, no response to shaking/name call , placed npa with no resistance by pt, continued physical stimulation

## 2015-03-13 NOTE — Op Note (Signed)
03/13/2015  11:03 AM  PATIENT:  Taylor Burnett  64 y.o. female  PRE-OPERATIVE DIAGNOSIS:  Cervical spondylosis with cervical spinal stenosis C3-4 C4-5 C5-6 with neck and arm pain  POST-OPERATIVE DIAGNOSIS:  Same  PROCEDURE:  1. Decompressive anterior cervical discectomy C3-4 C4-5 C5-6, 2. Anterior cervical arthrodesis C3-4, C4-5, C5-6 utilizing peek interbody cages packed with local autograft and morcellized allograft, 3. Anterior cervical plating C3-C6 inclusive utilizing a nuvasive translational plate  SURGEON:  Marikay Alar, MD  ASSISTANTS: Dr. Mikal Plane  ANESTHESIA:   General  EBL: 100 ml  Total I/O In: 1500 [I.V.:1500] Out: 100 [Blood:100]  BLOOD ADMINISTERED:none  DRAINS: 7 flat JP   SPECIMEN:  No Specimen  INDICATION FOR PROCEDURE: This patient presented with a long history of neck and bilateral arm pain. MRI showed spondylolisthesis at C3-4, and spondylosis with severe spinal stenosis C4-5 and C5-6. She tried medical management without relief. Her pain was debilitating. I recommended ACDF with plating. Patient understood the risks, benefits, and alternatives and potential outcomes and wished to proceed.  PROCEDURE DETAILS: Patient was brought to the operating room placed under general endotracheal anesthesia. Patient was placed in the supine position on the operating room table. The neck was prepped with Duraprep and draped in a sterile fashion.   Three cc of local anesthesia was injected and a transverse incision was made on the right side of the neck.  Dissection was carried down thru the subcutaneous tissue and the platysma was  elevated, opened, and undermined with Metzenbaum scissors.  Dissection was then carried out thru an avascular plane leaving the sternocleidomastoid carotid artery and jugular vein laterally and the trachea and esophagus medially. The ventral aspect of the vertebral column was identified and a localizing x-ray was taken. The C5-6 level was identified.  The longus colli muscles were then elevated and the retractor was placed to expose C3-4, C4-5, and C5-6. The annulus was incised and the disc space entered at each level. The disks were very collapsed and spondylotic. Discectomy was performed with micro-curettes and pituitary rongeurs. I then used the high-speed drill to drill the endplates down to the level of the posterior longitudinal ligament. The drill shavings were saved in a mucous trap for later arthrodesis. The operating microscope was draped and brought into the field provided additional magnification, illumination and visualization. Discectomy was continued posteriorly thru the disc space at each level. Posterior longitudinal ligament was opened with a nerve hook at each level, and then removed along with disc herniation and osteophytes, decompressing the spinal canal and thecal sac. There was severe compression at both C4-5 and C5-6 with severe deformity of the dura. We then continued to remove osteophytic overgrowth and disc material decompressing the neural foramina and exiting nerve roots bilaterally. The scope was angled up and down to help decompress and undercut the vertebral bodies. Once the decompression was completed we could pass a nerve hook circumferentially to assure adequate decompression in the midline and in the neural foramina. So by both visualization and palpation we felt we had an adequate decompression of the neural elements. We then measured the height of the intravertebral disc space and selected a 6 millimeter Peek interbody cage packed with autograft and morcellized allograft. It was then gently positioned in the intravertebral disc space at each level and countersunk. I then used a Nuvasive translational plate and placed the fixed angle screws into the vertebral bodies and locked them into position. The wound was irrigated with bacitracin solution, checked for hemostasis which  was established and confirmed. Once meticulous  hemostasis was achieved, we then proceeded with closure. The platysma was closed with interrupted 3-0 undyed Vicryl suture, the subcuticular layer was closed with interrupted 3-0 undyed Vicryl suture. The skin edges were approximated with steristrips. The drapes were removed. A sterile dressing was applied. The patient was then awakened from general anesthesia and transferred to the recovery room in stable condition. At the end of the procedure all sponge, needle and instrument counts were correct.   PLAN OF CARE: Admit for overnight observation  PATIENT DISPOSITION:  PACU - hemodynamically stable.   Delay start of Pharmacological VTE agent (>24hrs) due to surgical blood loss or risk of bleeding:  yes

## 2015-03-13 NOTE — Progress Notes (Signed)
Orthopedic Tech Progress Note Patient Details:  Taylor Burnett [birth year] [id number]  Ortho Devices Type of Ortho Device: Soft collar Ortho Device/Splint Interventions: Application   Taylor Burnett 03/13/2015, 12:48 PM

## 2015-03-14 DIAGNOSIS — M4802 Spinal stenosis, cervical region: Secondary | ICD-10-CM | POA: Diagnosis not present

## 2015-03-14 MED ORDER — HYDROCODONE-ACETAMINOPHEN 5-325 MG PO TABS: 1.0000 | ORAL_TABLET | Freq: Four times a day (QID) | ORAL | Status: DC | PRN

## 2015-03-14 MED ORDER — METHOCARBAMOL 500 MG PO TABS: 500.0000 mg | ORAL_TABLET | Freq: Four times a day (QID) | ORAL | Status: DC | PRN

## 2015-03-14 NOTE — Progress Notes (Signed)
Pt doing well. Pt and daughter given D/C instructions with Rx's, verbal understanding was provided. Pt's incision is clean and dry with no evidence of infection. Pt's JP drain and IV were removed prior to D/C. Pt D/C'd home via wheelchair @ 1030 per MD order. Pt is stable @ D/C and has no other needs at this time. Rema Fendt, RN

## 2015-03-14 NOTE — Discharge Instructions (Signed)
Wound Care Keep incision covered and dry for one week.  If you shower prior to then, cover incision with plastic wrap.  You may remove outer bandage after one week and shower.  Do not put any creams, lotions, or ointments on incision. Leave steri-strips on neck.  They will fall off by themselves. Activity Walk each and every day, increasing distance each day. No lifting greater than 5 lbs.  Avoid excessive neck motion. No driving for 2 weeks; may ride as a passenger locally. Wear neck brace at all times except when showering or otherwise instructed. Diet Resume your normal diet.  Return to Work Will be discussed at you follow up appointment. Call Your Doctor If Any of These Occur Redness, drainage, or swelling at the wound.  Temperature greater than 101 degrees. Severe pain not relieved by pain medication. Increased difficulty swallowing.  Incision starts to come apart. Follow Up Appt Call today for appointment in 1-2 weeks [phone]) or for problems.  If you have any hardware placed in your spine, you will need an x-ray before your appointment.   

## 2015-03-14 NOTE — Discharge Summary (Signed)
  Physician Discharge Summary  Patient ID: Taylor Burnett MRN: [id number] DOB/AGE: 04-17-1951 64 y.o.  Admit date: 03/13/2015 Discharge date: 03/14/2015  Admission Diagnoses:Cervical spondylosis with cervical spinal stenosis C3-4 C4-5 C5-6 with neck and arm pain   Discharge Diagnoses: Cervical spondylosis with cervical spinal stenosis C3-4 C4-5 C5-6 with neck and arm pain  Active Problems:   S/P cervical spinal fusion   Discharged Condition: good  Hospital Course: [name] was admitted and taken to the operating room for an uncomplicated cervical decompression, and fusion. Post op she is doing well with some mild problems with solid food. Her wound is clean dry, and without signs of infection. She is voiding, and ambulating.   Treatments: surgery: 1. Decompressive anterior cervical discectomy C3-4 C4-5 C5-6, 2. Anterior cervical arthrodesis C3-4, C4-5, C5-6 utilizing peek interbody cages packed with local autograft and morcellized allograft, 3. Anterior cervical plating C3-C6 inclusive utilizing a nuvasive translational plate   Discharge Exam: Blood pressure 91/56, pulse 65, temperature 98.3 F (36.8 C), temperature source Oral, resp. rate 18, height 5' 3.5" (1.613 m), weight 63.504 kg (140 lb), SpO2 97 %. General appearance: alert, cooperative, appears stated age and no distress  Moving all extremities well. Complaining of numbness in right hand which is her baseline.   Disposition: 01-Home or Self Care stenosis    Medication List    TAKE these medications        acetaminophen 500 MG tablet  Commonly known as:  TYLENOL  Take 1,000 mg by mouth every 6 (six) hours as needed. For pain/fever.     albuterol 108 (90 BASE) MCG/ACT inhaler  Commonly known as:  VENTOLIN HFA  Inhale 2 puffs into the lungs every 6 (six) hours as needed.     HYDROcodone-acetaminophen 5-325 MG per tablet  Commonly known as:  NORCO/VICODIN  Take 1-2 tablets by mouth every 6 (six) hours as needed  for moderate pain.     methocarbamol 500 MG tablet  Commonly known as:  ROBAXIN  Take 1 tablet (500 mg total) by mouth every 6 (six) hours as needed for muscle spasms.     Umeclidinium-Vilanterol 62.5-25 MCG/INH Aepb  Commonly known as:  ANORO ELLIPTA  Inhale 1 puff into the lungs every morning.           Follow-up Information    Follow up with JONES,DAVID S, MD In 2 weeks.   Specialty:  Neurosurgery   Contact information:   [address] [place] [address] [phone]       Signed: Carmela Hurt 03/14/2015, 10:13 AM

## 2015-03-17 ENCOUNTER — Encounter (HOSPITAL_COMMUNITY): Payer: Self-pay | Admitting: Neurological Surgery

## 2015-04-08 ENCOUNTER — Encounter: Payer: Self-pay | Admitting: Pulmonary Disease

## 2015-04-08 MED ORDER — UMECLIDINIUM-VILANTEROL 62.5-25 MCG/INH IN AEPB: 1.0000 | INHALATION_SPRAY | RESPIRATORY_TRACT | Status: DC

## 2015-04-08 NOTE — Telephone Encounter (Signed)
  Hi [place],  I have called your pharmacy and requested that they send [place] the appropriate forms so that we can try to get the Anoro improved by your insurance. This can take a few days, but we will begin working on this as soon as possible. In the meantime I have left you a sample of Anoro to pick up at the front desk at our office. We will inform you once the insurance makes a decision, and choose a covered alternative if Dr Shelle Iron feels this is appropriate.     Received PA form from [place] [employer] at [phone] The ins prefers that she try spiriva first  [facility], please advise thanks

## 2015-04-08 NOTE — Telephone Encounter (Signed)
Can see if they cover stiolto, or if the cover spiriva/striverdi

## 2015-04-14 ENCOUNTER — Telehealth: Payer: Self-pay | Admitting: *Deleted

## 2015-04-14 NOTE — Telephone Encounter (Signed)
Form has been filled out and faxed back. Will await approval/denial

## 2015-04-14 NOTE — Telephone Encounter (Signed)
Received request for PA on Anoro 62.5. Pt has Medicaid therefore PA must go thru [employer]. Form has been completed and faxed to [facility] for Dr. Shelle Iron to review and sign. Please fax form back to [employer] at fax # on bottom of form. Pt ID [id number]

## 2015-04-14 NOTE — Telephone Encounter (Signed)
done

## 2015-04-14 NOTE — Telephone Encounter (Signed)
Form placed in [place] green folder. Please advise thanks 

## 2015-04-22 NOTE — Telephone Encounter (Signed)
Will forward to [facility] to f/u on since I have not received anything. Please advise thanks

## 2015-04-22 NOTE — Telephone Encounter (Signed)
Anoro Ellipta has been approved Both patient and pharmacy notified. Approval # [id number]

## 2015-05-11 ENCOUNTER — Ambulatory Visit (INDEPENDENT_AMBULATORY_CARE_PROVIDER_SITE_OTHER): Payer: Medicaid Other | Admitting: Pulmonary Disease

## 2015-05-11 ENCOUNTER — Encounter: Payer: Self-pay | Admitting: Pulmonary Disease

## 2015-05-11 VITALS — BP 110/68 | HR 77 | Temp 97.8°F | Ht 62.0 in | Wt 135.8 lb

## 2015-05-11 DIAGNOSIS — J438 Other emphysema: Secondary | ICD-10-CM | POA: Diagnosis not present

## 2015-05-11 NOTE — Patient Instructions (Signed)
Stay on anoro in am, and can use albuterol for rescue in evening if having breathing issues. Will refer to pulmonary rehab program at [facility]. followup with Dr. Delton Coombes in 30mos

## 2015-05-11 NOTE — Assessment & Plan Note (Signed)
The patient feels that she is at her usual baseline, and actually did very well with her cervical spine surgery. She initially felt anoro may not be making a difference, but she ran out of her inhaler while waiting on prior authorization, and saw significant worsening of her symptoms. She has not had acute exacerbations, and therefore I would like to continue her on a LABA/LAMA for maintenance. I have also stressed to her the importance of a conditioning program, and she is now willing to consider the pulmonary rehabilitation program at [facility].

## 2015-05-11 NOTE — Progress Notes (Signed)
   Subjective:    Patient ID: Taylor Burnett, female    DOB: March 07, 1951, 64 y.o.   MRN: [id number]  HPI Patient comes in today for follow-up of her known COPD. She has been staying on anoro, and feels that it does actually help her breathing. She has had her cervical spine surgery, and did very well. She feels that her breathing is at baseline, and has not had any recent acute exacerbation. She does occasionally have some breakthrough symptoms in the evening, and I've asked her to use her rescue inhaler if needed. She reports that her main issue is weakness of her lower extremities, and I have stressed to her again the importance of a conditioning program.   Review of Systems  Constitutional: Negative for fever and unexpected weight change.  HENT: Negative for congestion, dental problem, ear pain, nosebleeds, postnasal drip, rhinorrhea, sinus pressure, sneezing, sore throat and trouble swallowing.   Eyes: Negative for redness and itching.  Respiratory: Negative for cough, chest tightness, shortness of breath and wheezing.   Cardiovascular: Negative for palpitations and leg swelling.  Gastrointestinal: Negative for nausea and vomiting.  Genitourinary: Negative for dysuria.  Musculoskeletal: Negative for joint swelling.  Skin: Negative for rash.  Neurological: Negative for headaches.  Hematological: Does not bruise/bleed easily.  Psychiatric/Behavioral: Negative for dysphoric mood. The patient is not nervous/anxious.        Objective:   Physical Exam Well-developed female in no acute distress Nose without purulence or discharge noted Neck without lymphadenopathy or thyromegaly Chest with decreased breath sounds, no active wheezing Cardiac exam with regular rate and rhythm Lower extremities without edema, no cyanosis Alert and oriented, moves all 4 extremities.       Assessment & Plan:

## 2015-05-19 ENCOUNTER — Encounter (HOSPITAL_COMMUNITY): Payer: Self-pay

## 2015-05-19 ENCOUNTER — Encounter (HOSPITAL_COMMUNITY)
Admission: RE | Admit: 2015-05-19 | Discharge: 2015-05-19 | Disposition: A | Discharge status: Home / Self Care | Payer: Medicaid Other | Source: Ambulatory Visit | Attending: Pulmonary Disease | Admitting: Pulmonary Disease

## 2015-05-19 VITALS — BP 124/76 | HR 80 | Ht 62.0 in | Wt 136.1 lb

## 2015-05-19 DIAGNOSIS — J438 Other emphysema: Secondary | ICD-10-CM | POA: Diagnosis present

## 2015-05-19 HISTORY — DX: Spondylosis, unspecified: M47.9

## 2015-05-19 HISTORY — DX: Other cervical disc degeneration, unspecified cervical region: M50.30

## 2015-05-19 HISTORY — DX: Age-related osteoporosis without current pathological fracture: M81.0

## 2015-05-19 NOTE — Progress Notes (Signed)
Cardiac/Pulmonary Rehab Medication Review by a Pharmacist  Does the patient  feel that his/her medications are working for him/her?  yes  Has the patient been experiencing any side effects to the medications prescribed?  no  Does the patient measure his/her own blood pressure or blood glucose at home?  no   Does the patient have any problems obtaining medications due to transportation or finances?   no  Understanding of regimen: excellent Understanding of indications: excellent Potential of compliance: excellent  Questions asked to Determine Patient Understanding of Medication Regimen:  1. What is the name of the medication?  2. What is the medication used for?  3. When should it be taken?  4. How much should be taken?  5. How will you take it?  6. What side effects should you report?  Understanding Defined as: Excellent: All questions above are correct Good: Questions 1-4 are correct Fair: Questions 1-2 are correct  Poor: 1 or none of the above questions are correct   Pharmacist comments: Pt states that the Anoro inhaler makes it more difficult to sleep.  Otherwise no side effects from meds.    Valrie Hart A 05/19/2015 2:52 PM

## 2015-05-19 NOTE — Progress Notes (Signed)
Patient arrived at 1430.  Patient referred to Pulmonary Rehab by Dr. Shelle Iron due to (J43.8)  Dr. Shelle Iron is her cardiologist and [facility] is her PCP.  During orientation advised patient on arrival and appointment times what to wear, what to do before, during and after exercise.  Reviewed attendance and class policy.  Talked about inclement weather and class consultation policy. Patient is scheduled to start cardiac Rehab on May 28, 2015 at 1330.  Patient was advised to come to class 5 minutes before class starts.  She was also given instructions on meeting with the dietician and attending the Family Structure classes. Pt is eager to get started.  Patient finished six minute pre walk test. Patient finished education/orientation at 1620.

## 2015-05-28 ENCOUNTER — Encounter (HOSPITAL_COMMUNITY)
Admission: RE | Admit: 2015-05-28 | Discharge: 2015-05-28 | Disposition: A | Discharge status: Home / Self Care | Payer: Medicaid Other | Source: Ambulatory Visit | Attending: Pulmonary Disease | Admitting: Pulmonary Disease

## 2015-05-28 DIAGNOSIS — J438 Other emphysema: Secondary | ICD-10-CM | POA: Insufficient documentation

## 2015-06-02 ENCOUNTER — Encounter (HOSPITAL_COMMUNITY)
Admission: RE | Admit: 2015-06-02 | Discharge: 2015-06-02 | Disposition: A | Discharge status: Home / Self Care | Payer: Medicaid Other | Source: Ambulatory Visit | Attending: Pulmonary Disease | Admitting: Pulmonary Disease

## 2015-06-02 DIAGNOSIS — J438 Other emphysema: Secondary | ICD-10-CM | POA: Diagnosis not present

## 2015-06-03 NOTE — Progress Notes (Signed)
Pulmonary Rehabilitation Program Outcomes Report   Orientation:  05/19/15 Graduate Date:  tbd Discharge Date:  tbd # of sessions completed: 3  Pulmonologist: Clance Family MD:  [facility] Class Time:  1330  A.  Exercise Program:  Tolerates exercise @ 3.36 METS for 15 minutes and Walk Test Results:  Pre: 2.68 mets  B.  Mental Health:  Good mental attitude  C.  Education/Instruction/Skills  Accurately checks own pulse.  Rest:  75  Exercise:  84  Uses Perceived Exertion Scale and/or Dyspnea Scale  D.  Nutrition/Weight Control/Body Composition:  Adherence to prescribed nutrition program: fair    E.  Blood Lipids   No results found for: CHOL, HDL, LDLCALC, LDLDIRECT, TRIG, CHOLHDL  F.  Lifestyle Changes:  Making positive lifestyle changes  G.  Symptoms noted with exercise:  Asymptomatic  Report Completed By:  Doretha Sou RN   Comments:  This is patients first week of [facility] Cardiac Rehab.

## 2015-06-04 ENCOUNTER — Encounter (HOSPITAL_COMMUNITY): Payer: Medicaid Other

## 2015-06-09 ENCOUNTER — Encounter (HOSPITAL_COMMUNITY): Payer: Medicaid Other

## 2015-06-11 ENCOUNTER — Encounter (HOSPITAL_COMMUNITY)
Admission: RE | Admit: 2015-06-11 | Discharge: 2015-06-11 | Disposition: A | Discharge status: Home / Self Care | Payer: Medicaid Other | Source: Ambulatory Visit | Attending: Pulmonary Disease | Admitting: Pulmonary Disease

## 2015-06-11 DIAGNOSIS — J438 Other emphysema: Secondary | ICD-10-CM | POA: Diagnosis not present

## 2015-06-16 ENCOUNTER — Encounter (HOSPITAL_COMMUNITY)
Admission: RE | Admit: 2015-06-16 | Discharge: 2015-06-16 | Disposition: A | Discharge status: Home / Self Care | Payer: Medicaid Other | Source: Ambulatory Visit | Attending: Pulmonary Disease | Admitting: Pulmonary Disease

## 2015-06-16 DIAGNOSIS — J438 Other emphysema: Secondary | ICD-10-CM | POA: Diagnosis not present

## 2015-06-18 ENCOUNTER — Encounter (HOSPITAL_COMMUNITY)
Admission: RE | Admit: 2015-06-18 | Discharge: 2015-06-18 | Disposition: A | Discharge status: Home / Self Care | Payer: Medicaid Other | Source: Ambulatory Visit | Attending: Pulmonary Disease | Admitting: Pulmonary Disease

## 2015-06-18 DIAGNOSIS — J438 Other emphysema: Secondary | ICD-10-CM | POA: Diagnosis not present

## 2015-06-23 ENCOUNTER — Encounter (HOSPITAL_COMMUNITY): Payer: Medicaid Other

## 2015-06-25 ENCOUNTER — Encounter (HOSPITAL_COMMUNITY): Payer: Medicaid Other

## 2015-06-30 ENCOUNTER — Encounter (HOSPITAL_COMMUNITY): Payer: Medicaid Other

## 2015-07-02 ENCOUNTER — Encounter (HOSPITAL_COMMUNITY): Payer: Medicaid Other

## 2015-07-07 ENCOUNTER — Encounter (HOSPITAL_COMMUNITY): Payer: Medicaid Other

## 2015-07-08 ENCOUNTER — Other Ambulatory Visit: Payer: Self-pay | Admitting: Neurological Surgery

## 2015-07-08 DIAGNOSIS — M4316 Spondylolisthesis, lumbar region: Secondary | ICD-10-CM

## 2015-07-08 NOTE — Progress Notes (Signed)
Patient is discharged from [facility] Cardiac [facility] Pulmonary program today, July 08, 2015 with 6 sessions.  Patient stopped due to illness and lack of motivation.

## 2015-07-09 ENCOUNTER — Encounter (HOSPITAL_COMMUNITY): Payer: Medicaid Other

## 2015-07-14 ENCOUNTER — Encounter (HOSPITAL_COMMUNITY): Payer: Medicaid Other

## 2015-07-14 ENCOUNTER — Ambulatory Visit
Admission: RE | Admit: 2015-07-14 | Discharge: 2015-07-14 | Disposition: A | Discharge status: Home / Self Care | Payer: Medicaid Other | Source: Ambulatory Visit | Attending: Neurological Surgery | Admitting: Neurological Surgery

## 2015-07-14 DIAGNOSIS — M4316 Spondylolisthesis, lumbar region: Secondary | ICD-10-CM

## 2015-07-14 MED ORDER — DIAZEPAM 5 MG PO TABS
10.0000 mg | ORAL_TABLET | Freq: Once | ORAL | Status: AC
  Administered 2015-07-14: 5 mg via ORAL

## 2015-07-14 MED ORDER — ONDANSETRON HCL 4 MG/2ML IJ SOLN: 4.0000 mg | Freq: Four times a day (QID) | INTRAMUSCULAR | Status: DC | PRN

## 2015-07-14 MED ORDER — MEPERIDINE HCL 100 MG/ML IJ SOLN
75.0000 mg | Freq: Once | INTRAMUSCULAR | Status: AC
  Administered 2015-07-14: 75 mg via INTRAMUSCULAR

## 2015-07-14 MED ORDER — IOHEXOL 180 MG/ML  SOLN
15.0000 mL | Freq: Once | INTRAMUSCULAR | Status: AC | PRN
  Administered 2015-07-14: 15 mL via INTRATHECAL

## 2015-07-14 MED ORDER — ONDANSETRON HCL 4 MG/2ML IJ SOLN
4.0000 mg | Freq: Once | INTRAMUSCULAR | Status: AC
  Administered 2015-07-14: 4 mg via INTRAMUSCULAR

## 2015-07-14 NOTE — Discharge Instructions (Signed)

## 2015-07-16 ENCOUNTER — Encounter (HOSPITAL_COMMUNITY): Payer: Medicaid Other

## 2015-07-21 ENCOUNTER — Encounter (HOSPITAL_COMMUNITY): Payer: Medicaid Other

## 2015-07-23 ENCOUNTER — Encounter (HOSPITAL_COMMUNITY): Payer: Medicaid Other

## 2015-07-28 ENCOUNTER — Encounter (HOSPITAL_COMMUNITY): Payer: Medicaid Other

## 2015-07-30 ENCOUNTER — Encounter (HOSPITAL_COMMUNITY): Payer: Medicaid Other

## 2015-08-04 ENCOUNTER — Encounter (HOSPITAL_COMMUNITY): Payer: Medicaid Other

## 2015-08-04 ENCOUNTER — Telehealth: Payer: Self-pay | Admitting: Emergency Medicine

## 2015-08-04 MED ORDER — UMECLIDINIUM-VILANTEROL 62.5-25 MCG/INH IN AEPB: 1.0000 | INHALATION_SPRAY | RESPIRATORY_TRACT | Status: DC

## 2015-08-04 NOTE — Telephone Encounter (Signed)
Patient was given sample of Anoro from Dr. Shelle Iron.  Patient needed Rx sent to pharmacy.  She says she is not sure if her insurance will cover it.  Rx was submitted and patient will let us know if her insurance will not cover.   Nothing further needed.

## 2015-08-06 ENCOUNTER — Encounter (HOSPITAL_COMMUNITY): Payer: Medicaid Other

## 2015-08-11 ENCOUNTER — Encounter (HOSPITAL_COMMUNITY): Payer: Medicaid Other

## 2015-08-13 ENCOUNTER — Encounter (HOSPITAL_COMMUNITY): Payer: Medicaid Other

## 2015-08-18 ENCOUNTER — Encounter (HOSPITAL_COMMUNITY): Payer: Medicaid Other

## 2015-11-10 ENCOUNTER — Ambulatory Visit: Payer: Medicaid Other | Admitting: Emergency Medicine

## 2016-01-05 ENCOUNTER — Encounter: Payer: Self-pay | Admitting: Emergency Medicine

## 2016-01-05 ENCOUNTER — Ambulatory Visit (INDEPENDENT_AMBULATORY_CARE_PROVIDER_SITE_OTHER): Payer: Medicaid Other | Admitting: Emergency Medicine

## 2016-01-05 VITALS — BP 104/64 | HR 92 | Ht 63.0 in | Wt 140.0 lb

## 2016-01-05 DIAGNOSIS — J310 Chronic rhinitis: Secondary | ICD-10-CM

## 2016-01-05 DIAGNOSIS — J438 Other emphysema: Secondary | ICD-10-CM | POA: Diagnosis not present

## 2016-01-05 NOTE — Progress Notes (Signed)
Subjective:    Patient ID: Taylor Burnett, female    DOB: 02-18-1951, 65 y.o.   MRN: [id number]  HPI 65 yo woman, former smoker, has been followed by Dr Shelle Iron for COPD. She has participated in the Pulm rehab program since last visit here > she didn't feel that she benefited. She felt that she got mixed messages from them regarding exercise and rest. She stopped after she almost fell while walking on treadmill. She has been managed on Anoro. She can tell it is wearing off by the end of the day.  She has been doing exercise at home. She feels that her breathing may be worse. She has chronic cough - can happen at any time, more in the am.  Non-productive, wants to be able to bring up secretions.  She uses albuterol daily, usually at night.    Review of Systems As per HPI  Past Medical History  Diagnosis Date  . COPD (chronic obstructive pulmonary disease) (HCC)   . Arthritis   . Panic attacks   . Dysrhythmia     RAPID HEART BEAT OCC   . Emphysema of lung (HCC)   . Shortness of breath dyspnea   . Cough   . GERD (gastroesophageal reflux disease)     OCC  . Headache   . DDD (degenerative disc disease), cervical   . Osteoporosis   . Spondylosis      Family History  Problem Relation Age of Onset  . Emphysema Father   . Allergies Mother   . Allergies Son   . Allergies Brother   . Heart disease Maternal Grandmother   . Rheum arthritis Brother   . Cancer      aunts and uncles     Social History   Social History  . Marital Status: Widowed    Spouse Name: N/A  . Number of Children: N/A  . Years of Education: N/A   Occupational History  . on disability.    Social History Main Topics  . Smoking status: Former Smoker -- 2.00 packs/day for 40 years    Types: Cigarettes    Quit date: 07/31/2013  . Smokeless tobacco: Not on file  . Alcohol Use: Yes     Comment: OCC WINE   . Drug Use: No  . Sexual Activity: Not on file   Other Topics Concern  . Not on file   Social  History Narrative   Divorced and lives alone.      Allergies  Allergen Reactions  . Prednisone Other (See Comments)    Depression and suicidal     Outpatient Prescriptions Prior to Visit  Medication Sig Dispense Refill  . albuterol (VENTOLIN HFA) 108 (90 BASE) MCG/ACT inhaler Inhale 2 puffs into the lungs every 6 (six) hours as needed. 1 Inhaler 2  . Ascorbic Acid (VITAMIN C) 1000 MG tablet Take 1,000 mg by mouth daily.    . cholecalciferol (VITAMIN D) 1000 UNITS tablet Take 5,000 Units by mouth daily.    Marland Kitchen Umeclidinium-Vilanterol (ANORO ELLIPTA) 62.5-25 MCG/INH AEPB Inhale 1 puff into the lungs every morning. 1 each 3  . vitamin B-12 (CYANOCOBALAMIN) 100 MCG tablet Take 100 mcg by mouth daily.    . Melatonin 10 MG TABS Take 10 mg by mouth at bedtime. Reported on 01/05/2016    . oxyCODONE-acetaminophen (PERCOCET/ROXICET) 5-325 MG per tablet Take 1 tablet by mouth every 8 (eight) hours as needed for severe pain.      No facility-administered medications prior to  visit.          Objective:   Physical Exam Filed Vitals:   01/05/16 1431  BP: 104/64  Pulse: 92  Height:  (1.6 m)  Weight: 140 lb (63.504 kg)  SpO2: 90%   Gen: Pleasant, well-nourished, in no distress,  normal affect  ENT: No lesions,  mouth clear,  oropharynx clear, no postnasal drip  Neck: No JVD, no TMG, no carotid bruits  Lungs: No use of accessory muscles, clear without rales or rhonchi  Cardiovascular: RRR, heart sounds normal, no murmur or gallops, no peripheral edema  Musculoskeletal: No deformities, no cyanosis or clubbing  Neuro: alert, non focal  Skin: Warm, no lesions or rashes      Assessment & Plan:  COPD (chronic obstructive pulmonary disease) Please continue your Anoro daily.  Restart Zyrtec once a day  Try starting mucinex  daily If you continue to have nasal congestion then we can start fluticasone nasal spray, 2 sprays each nostril daily Use albuterol as needed or  shortness of breath.  You would benefit from the flu shot every year. Follow with Dr Delton Coombes in 6 months or sooner if you have any problems  Chronic rhinitis Trial of Zyrtec, consider adding nasal steroid if she continues to have symptoms

## 2016-01-05 NOTE — Assessment & Plan Note (Signed)
Trial of Zyrtec, consider adding nasal steroid if she continues to have symptoms

## 2016-01-05 NOTE — Patient Instructions (Signed)
Please continue your Anoro daily.  Restart Zyrtec once a day  Try starting mucinex 600mg daily If you continue to have nasal congestion then we can start fluticasone nasal spray, 2 sprays each nostril daily Use albuterol as needed or shortness of breath.  You would benefit from the flu shot every year. Follow with Dr Morgen Linebaugh in 6 months or sooner if you have any problems 

## 2016-01-05 NOTE — Assessment & Plan Note (Signed)
Please continue your Anoro daily.  Restart Zyrtec once a day  Try starting mucinex 600mg  daily If you continue to have nasal congestion then we can start fluticasone nasal spray, 2 sprays each nostril daily Use albuterol as needed or shortness of breath.  You would benefit from the flu shot every year. Follow with Dr Delton Coombes in 6 months or sooner if you have any problems

## 2016-01-06 ENCOUNTER — Other Ambulatory Visit: Payer: Self-pay | Admitting: Emergency Medicine

## 2016-03-01 IMAGING — RF DG CERVICAL SPINE 1V
1 series · 1 of 1 positions shown · non-contrast
Comparison: Cervical ultrasound 09/20/2014

CLINICAL DATA: ACDF

EXAM:
DG C-ARM 61-120 MIN; DG CERVICAL SPINE - 1 VIEW
:

[Series 1: run · 1 of 1 slices shown]
[im 1/1]
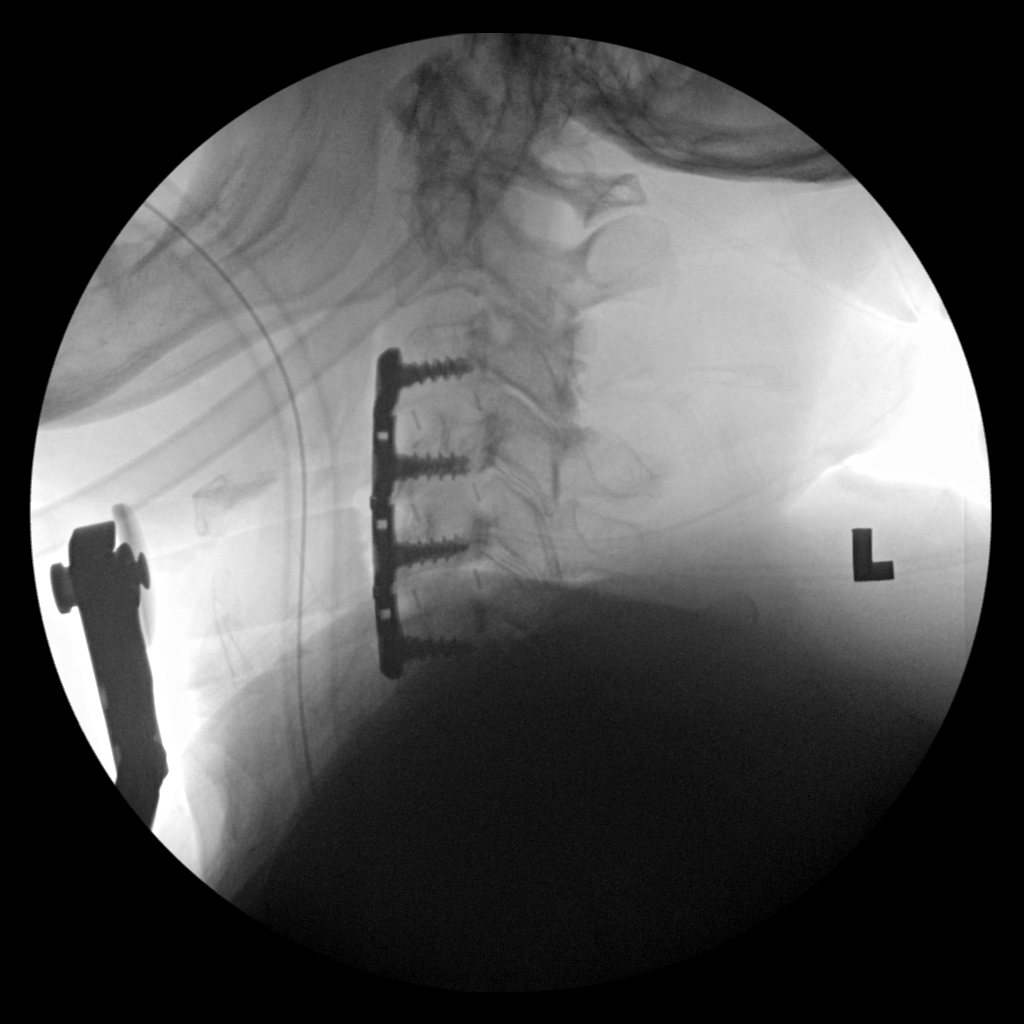

[1 of 1 positions shown; findings below may reference images not displayed]

FINDINGS: ACDF C3 through C6 with anterior plate and screws in good position.
Interbody bone graft at C3-4, C4-5, C5-6 in good position. No
immediate complication on single C-arm lateral image.
IMPRESSION: Satisfactory ACDF C3 through C6.

## 2016-03-03 ENCOUNTER — Telehealth: Payer: Self-pay | Admitting: Emergency Medicine

## 2016-03-03 MED ORDER — ALBUTEROL SULFATE HFA 108 (90 BASE) MCG/ACT IN AERS: 2.0000 | INHALATION_SPRAY | Freq: Four times a day (QID) | RESPIRATORY_TRACT | Status: DC | PRN

## 2016-03-03 NOTE — Telephone Encounter (Signed)
Called pt and refill sent in for ventolin. Nothing further needed

## 2016-05-12 ENCOUNTER — Telehealth: Payer: Self-pay | Admitting: Emergency Medicine

## 2016-05-12 MED ORDER — UMECLIDINIUM-VILANTEROL 62.5-25 MCG/INH IN AEPB: 1.0000 | INHALATION_SPRAY | Freq: Every day | RESPIRATORY_TRACT | Status: AC

## 2016-05-12 NOTE — Telephone Encounter (Signed)
Called [address] and spoke with [place] . I informed her that we received a call from the patient stating she needs a PA for her Anoro. She states that the patient will need a PA and that she will have Bonita Quin to fax over the PA forms today. Forms will be faxed to [phone]. She voiced understanding and had no further questions.   Called spoke with the patient. Informed her of the above. She states she is out of Anoro. i placed 3 samples of anoro at the front of the office for pick up. She is aware. She had no further questions and voiced understanding. Nothing further needed at this time.

## 2016-05-16 ENCOUNTER — Telehealth: Payer: Self-pay | Admitting: *Deleted

## 2016-05-16 NOTE — Telephone Encounter (Signed)
Initiated PA for [employer] thru [employer]. [id number] Ref # [id number]  Pt Id: [id number]  Submitted for review.   [facility] - [place], [place] - [address] [phone] (Phone) [phone] (Fax)

## 2016-05-25 NOTE — Telephone Encounter (Signed)
Anoro approved until 05/11/2017. Pharmacy informed.

## 2016-06-06 ENCOUNTER — Encounter: Payer: Self-pay | Admitting: Family Medicine

## 2016-06-06 ENCOUNTER — Ambulatory Visit (INDEPENDENT_AMBULATORY_CARE_PROVIDER_SITE_OTHER): Payer: Medicaid Other | Admitting: Family Medicine

## 2016-06-06 VITALS — BP 113/64 | HR 72 | Temp 97.5°F | Resp 18 | Ht 63.0 in | Wt 135.0 lb

## 2016-06-06 DIAGNOSIS — R002 Palpitations: Secondary | ICD-10-CM

## 2016-06-06 DIAGNOSIS — Z23 Encounter for immunization: Secondary | ICD-10-CM

## 2016-06-06 DIAGNOSIS — Z1239 Encounter for other screening for malignant neoplasm of breast: Secondary | ICD-10-CM

## 2016-06-06 DIAGNOSIS — T8619 Other complication of kidney transplant: Secondary | ICD-10-CM

## 2016-06-06 DIAGNOSIS — G4719 Other hypersomnia: Secondary | ICD-10-CM

## 2016-06-06 DIAGNOSIS — G4730 Sleep apnea, unspecified: Secondary | ICD-10-CM

## 2016-06-06 DIAGNOSIS — T8610 Unspecified complication of kidney transplant: Secondary | ICD-10-CM

## 2016-06-06 DIAGNOSIS — N289 Disorder of kidney and ureter, unspecified: Secondary | ICD-10-CM

## 2016-06-06 DIAGNOSIS — J438 Other emphysema: Secondary | ICD-10-CM

## 2016-06-06 DIAGNOSIS — G4700 Insomnia, unspecified: Secondary | ICD-10-CM | POA: Diagnosis not present

## 2016-06-06 DIAGNOSIS — L819 Disorder of pigmentation, unspecified: Secondary | ICD-10-CM

## 2016-06-06 DIAGNOSIS — R634 Abnormal weight loss: Secondary | ICD-10-CM

## 2016-06-06 LAB — POCT URINALYSIS DIP (DEVICE)
Bilirubin Urine: NEGATIVE
Glucose, UA: NEGATIVE mg/dL
HGB URINE DIPSTICK: NEGATIVE
Ketones, ur: NEGATIVE mg/dL
Nitrite: NEGATIVE
PROTEIN: NEGATIVE mg/dL
Specific Gravity, Urine: 1.015 (ref 1.005–1.030)
UROBILINOGEN UA: 0.2 mg/dL (ref 0.0–1.0)
pH: 7 (ref 5.0–8.0)

## 2016-06-06 LAB — CBC WITH DIFFERENTIAL/PLATELET
BASOS ABS: 55 {cells}/uL (ref 0–200)
Basophils Relative: 1 %
EOS ABS: 165 {cells}/uL (ref 15–500)
EOS PCT: 3 %
HEMATOCRIT: 43.2 % (ref 35.0–45.0)
Hemoglobin: 14.4 g/dL (ref 11.7–15.5)
LYMPHS ABS: 1540 {cells}/uL (ref 850–3900)
Lymphocytes Relative: 28 %
MCH: 30.3 pg (ref 27.0–33.0)
MCHC: 33.3 g/dL (ref 32.0–36.0)
MCV: 90.8 fL (ref 80.0–100.0)
MONO ABS: 385 {cells}/uL (ref 200–950)
MPV: 8.6 fL (ref 7.5–12.5)
Monocytes Relative: 7 %
NEUTROS PCT: 61 %
Neutro Abs: 3355 cells/uL (ref 1500–7800)
Platelets: 249 10*3/uL (ref 140–400)
RBC: 4.76 MIL/uL (ref 3.80–5.10)
RDW: 13.2 % (ref 11.0–15.0)
WBC: 5.5 10*3/uL (ref 3.8–10.8)

## 2016-06-06 LAB — TSH: TSH: 1.46 mIU/L

## 2016-06-06 LAB — HEMOGLOBIN A1C
Hgb A1c MFr Bld: 5.8 % — ABNORMAL HIGH (ref <5.7)
Mean Plasma Glucose: 120 mg/dL

## 2016-06-06 MED ORDER — AMITRIPTYLINE HCL 25 MG PO TABS: 25.0000 mg | ORAL_TABLET | Freq: Every evening | ORAL | Status: DC | PRN

## 2016-06-06 NOTE — Progress Notes (Signed)
Subjective:    Patient ID: Taylor Burnett, female    DOB: 1951/02/13, 65 y.o.   MRN: [id number]  HPI Taylor Burnett, a 65 year old female with a history of COPD presents to establish care. She says that she was a patient of [facility], but has been lost to follow-up over the past several months.  She has been followed by Dr. Levy Pupa for COPD. Taylor Burnett is a former smoker. Symptoms of COPd have been Managed on Anoro. She has not had to use albuterol over the past several months.  Patient presents with  insomnia and excessive daytime sleepiness. She says that she is getting 2-3 hours of sleep per night. She describes sleep quality as poor.  Snoring of moderate severity is present. She says that her brother has witnessed apneic episodes. She has attempted OTC Melatonin and Benadryl with minimal relief. She reports fatigue. She denies headaches, SOB, dysuria, nausea, vomiting, or diarrhea.   Patient complains of occasional heart palpitations. The symptoms are of mild severity, occuring intermittently . Cardiac risk factors include: former smoker and  sedentary lifestyle. Patient has not identified any palliative or provocative factors.   Past Medical History  Diagnosis Date  . COPD (chronic obstructive pulmonary disease) (HCC)   . Arthritis   . Panic attacks   . Dysrhythmia     RAPID HEART BEAT OCC   . Emphysema of lung (HCC)   . Shortness of breath dyspnea   . Cough   . GERD (gastroesophageal reflux disease)     OCC  . Headache   . DDD (degenerative disc disease), cervical   . Osteoporosis   . Spondylosis         Immunization History  Administered Date(s) Administered  . Tdap 06/06/2016   Past Surgical History  Procedure Laterality Date  . Tonsillectomy    . Tubal ligation    . Breast enhancement surgery  1990  . Anterior cervical decomp/discectomy fusion N/A 03/13/2015    Procedure: CERVICAL THREE-CERVICAL FOUR, CERVICAL FOUR-CERVICAL FIVE, CERVICAL  FIVE-CERVICAL SIX ANTERIOR CERVICAL DECOMPRESSION/DISCECTOMY FUSION 3 LEVELS;  Surgeon: Tia Alert, MD;  Location: [facility];  Service: Neurosurgery;  Laterality: N/A;  . Cardiac catheterization     Review of Systems  Constitutional: Positive for unexpected weight change (weight loss).  HENT: Negative.   Eyes: Negative.   Respiratory: Positive for apnea. Negative for cough and shortness of breath.   Cardiovascular: Positive for palpitations. Negative for chest pain and leg swelling.  Gastrointestinal: Negative.   Endocrine: Negative.   Genitourinary: Negative.   Musculoskeletal: Negative.   Allergic/Immunologic: Negative.  Negative for immunocompromised state.  Neurological: Negative.   Hematological: Negative.   Psychiatric/Behavioral: Negative for suicidal ideas, behavioral problems and sleep disturbance. The patient is nervous/anxious (occasional anxiety).         Objective:   Physical Exam  Constitutional: She is oriented to person, place, and time. She appears well-developed and well-nourished.  HENT:  Head: Normocephalic and atraumatic.  Right Ear: External ear normal.  Left Ear: External ear normal.  Nose: Nose normal.  Mouth/Throat: Oropharynx is clear and moist.  Eyes: Conjunctivae and EOM are normal. Pupils are equal, round, and reactive to light.  Neck: Normal range of motion. Neck supple.  Cardiovascular: Normal rate, regular rhythm, normal heart sounds and intact distal pulses.   Pulmonary/Chest: Effort normal and breath sounds normal.  Abdominal: Soft. Bowel sounds are normal.  Musculoskeletal: Normal range of motion.  Neurological:  She is alert and oriented to person, place, and time. She has normal reflexes.  Skin: Skin is warm, dry and intact.     Psychiatric: She has a normal mood and affect. Her behavior is normal. Judgment and thought content normal.       BP 113/64 mmHg  Pulse 72  Temp(Src) 97.5 F (36.4 C) (Oral)  Resp 18  Ht  (1.6 m)   Wt 135 lb (61.236 kg)  BMI 23.92 kg/m2  SpO2 94% Assessment & Plan:   1. Other emphysema (HCC) Recommend that Taylor Burnett follow up with Dr. Levy Pupa on July 04, 2016 as previously scheduled  2. Observed sleep apnea - Split night study; Future  3. Excessive daytime sleepiness - Split night study; Future  4. Insomnia Will start a trial of Elavil 25 mg HS. Practice good sleep hygiene as discussed.  - amitriptyline (ELAVIL) 25 MG tablet; Take 1 tablet (25 mg total) by mouth at bedtime as needed for sleep.  Dispense: 30 tablet; Refill: 1  5. Loss of weight Will follow up by phone with laboratory results - COMPLETE METABOLIC PANEL WITH GFR - CBC with Differential - Hemoglobin A1c - TSH - Hepatitis panel, acute - POCT urinalysis dip (device)  6. Heart palpitations  - TSH - EKG 12-Lead  7. Need for Tdap vaccination - Tdap vaccine greater than or equal to 7yo IM  8. Breast cancer screening  - MM Digital Screening; Future  10. Hyperpigmented skin lesion Several atypical moles/right mid back, 0.5 cm/ irregular borders, brown. She reports a family history of skin cancer (brother).  - Ambulatory referral to Dermatology   Routine Health Maintenance: Screening Colonoscopy Recommend breast cancer screening Pap smear 1 year ago/ normal per patient    Massie Maroon, FNP

## 2016-06-07 ENCOUNTER — Other Ambulatory Visit: Payer: Self-pay | Admitting: Family Medicine

## 2016-06-07 DIAGNOSIS — Z1231 Encounter for screening mammogram for malignant neoplasm of breast: Secondary | ICD-10-CM

## 2016-06-07 LAB — COMPLETE METABOLIC PANEL WITH GFR
ALBUMIN: 4.1 g/dL (ref 3.6–5.1)
ALT: 10 U/L (ref 6–29)
AST: 13 U/L (ref 10–35)
Alkaline Phosphatase: 66 U/L (ref 33–130)
BUN: 16 mg/dL (ref 7–25)
CO2: 28 mmol/L (ref 20–31)
CREATININE: 0.78 mg/dL (ref 0.50–0.99)
Calcium: 9.2 mg/dL (ref 8.6–10.4)
Chloride: 102 mmol/L (ref 98–110)
GFR, Est African American: >89 mL/min (ref >=60)
GFR, Est Non African American: 81 mL/min (ref >=60)
GLUCOSE: 84 mg/dL (ref 65–99)
Potassium: 4.4 mmol/L (ref 3.5–5.3)
SODIUM: 140 mmol/L (ref 135–146)
Total Bilirubin: 0.4 mg/dL (ref 0.2–1.2)
Total Protein: 6.5 g/dL (ref 6.1–8.1)

## 2016-06-07 LAB — HEPATITIS PANEL, ACUTE
HCV Ab: NEGATIVE
HEP A IGM: NONREACTIVE
HEP B C IGM: NONREACTIVE
Hepatitis B Surface Ag: NEGATIVE

## 2016-06-15 ENCOUNTER — Ambulatory Visit (HOSPITAL_COMMUNITY): Payer: Medicaid Other

## 2016-06-15 ENCOUNTER — Encounter: Payer: Self-pay | Admitting: Family Medicine

## 2016-06-15 ENCOUNTER — Ambulatory Visit: Payer: Medicaid Other

## 2016-06-19 ENCOUNTER — Encounter: Payer: Self-pay | Admitting: Family Medicine

## 2016-06-23 ENCOUNTER — Ambulatory Visit (HOSPITAL_COMMUNITY)
Admission: RE | Admit: 2016-06-23 | Discharge: 2016-06-23 | Disposition: A | Discharge status: Home / Self Care | Payer: Medicaid Other | Source: Ambulatory Visit | Attending: Family Medicine | Admitting: Family Medicine

## 2016-06-23 DIAGNOSIS — Z1231 Encounter for screening mammogram for malignant neoplasm of breast: Secondary | ICD-10-CM | POA: Diagnosis present

## 2016-07-11 ENCOUNTER — Encounter: Payer: Self-pay | Admitting: Emergency Medicine

## 2016-07-11 ENCOUNTER — Ambulatory Visit (INDEPENDENT_AMBULATORY_CARE_PROVIDER_SITE_OTHER): Payer: Medicaid Other | Admitting: Emergency Medicine

## 2016-07-11 VITALS — BP 102/66 | HR 78 | Ht 63.0 in | Wt 129.0 lb

## 2016-07-11 DIAGNOSIS — G478 Other sleep disorders: Secondary | ICD-10-CM | POA: Diagnosis not present

## 2016-07-11 DIAGNOSIS — G4730 Sleep apnea, unspecified: Secondary | ICD-10-CM | POA: Insufficient documentation

## 2016-07-11 DIAGNOSIS — J310 Chronic rhinitis: Secondary | ICD-10-CM

## 2016-07-11 DIAGNOSIS — R0902 Hypoxemia: Secondary | ICD-10-CM | POA: Diagnosis not present

## 2016-07-11 DIAGNOSIS — J438 Other emphysema: Secondary | ICD-10-CM | POA: Diagnosis not present

## 2016-07-11 DIAGNOSIS — G4700 Insomnia, unspecified: Secondary | ICD-10-CM

## 2016-07-11 MED ORDER — FLUTICASONE PROPIONATE 50 MCG/ACT NA SUSP: 2.0000 | Freq: Every day | NASAL | Status: DC

## 2016-07-11 NOTE — Assessment & Plan Note (Signed)
Start nasal steroid and follow her progress

## 2016-07-11 NOTE — Assessment & Plan Note (Signed)
Walking oximetry today 

## 2016-07-11 NOTE — Assessment & Plan Note (Signed)
She is set for a sleep study. I'll follow up with her after this is performed.

## 2016-07-11 NOTE — Addendum Note (Signed)
Addended by: Jaynee Eagles C on: 07/11/2016 02:39 PM   Modules accepted: Orders

## 2016-07-11 NOTE — Patient Instructions (Addendum)
Please continue your Anoro once a day  Take albuterol 2 puffs up to every 4 hours if needed for shortness of breath.  We will perform a walking oximetry today.  Please start fluticasone nasal spray 2 sprays each nostril daily If you continue to have difficulty moving air through your nose then we will refer you to see ENT Follow with Dr Delton Coombes in 3 months or sooner if you have any problems.

## 2016-07-11 NOTE — Progress Notes (Signed)
Subjective:    Patient ID: Taylor Burnett, female    DOB: June 10, 1951, 65 y.o.   MRN: [id number]  HPI 65 yo woman, former smoker, has been followed by Dr Shelle Iron for COPD. She has participated in the Pulm rehab program since last visit here > she didn't feel that she benefited. She felt that she got mixed messages from them regarding exercise and rest. She stopped after she almost fell while walking on treadmill. She has been managed on Anoro. She can tell it is wearing off by the end of the day.  She has been doing exercise at home. She feels that her breathing may be worse. She has chronic cough - can happen at any time, more in the am.  Non-productive, wants to be able to bring up secretions.  She uses albuterol daily, usually at night.   ROV 07/11/16 --  Follow-up visit for patient with a history of COPD, allergic rhinitis, last seen in January 2017. with a history of COPD, allergic rhinitis, last seen in January 2017. She notes that her breathing is not as good as it was a couple years ago, she has to stop to rest with exertion. She also has heavy breathing B lateral chest pain. Poor sleep. She is waking up frequently. She sometimes falls asleep during the day. She is using Anoro qd, has to miss it sometimes because she runs out. She uses albuterol about once a day. She took zyrtec for a while since last time. She isn't taking currently.    Review of Systems As per HPI  Past Medical History  Diagnosis Date  . COPD (chronic obstructive pulmonary disease) (HCC)   . Arthritis   . Panic attacks   . Dysrhythmia     RAPID HEART BEAT OCC   . Emphysema of lung (HCC)   . Shortness of breath dyspnea   . Cough   . GERD (gastroesophageal reflux disease)     OCC  . Headache   . DDD (degenerative disc disease), cervical   . Osteoporosis   . Spondylosis      Family History  Problem Relation Age of Onset  . Emphysema Father   . Allergies Mother   . Allergies Son   . Allergies Brother   . Heart disease Maternal Grandmother   . Rheum arthritis Brother   .  Cancer      aunts and uncles     Social History   Social History  . Marital Status: Widowed    Spouse Name: N/A  . Number of Children: N/A  . Years of Education: N/A   Occupational History  . on disability.    Social History Main Topics  . Smoking status: Former Smoker -- 2.00 packs/day for 40 years    Types: Cigarettes    Quit date: 07/31/2013  . Smokeless tobacco: Not on file  . Alcohol Use: No     Comment: OCC WINE   . Drug Use: No  . Sexual Activity: Not on file   Other Topics Concern  . Not on file   Social History Narrative   Divorced and lives alone.      Allergies  Allergen Reactions  . Prednisone Other (See Comments)    Depression and suicidal     Outpatient Prescriptions Prior to Visit  Medication Sig Dispense Refill  . albuterol (VENTOLIN HFA) 108 (90 Base) MCG/ACT inhaler Inhale 2 puffs into the lungs every 6 (six) hours as needed. 1 Inhaler 2  . amitriptyline (ELAVIL) 25 MG tablet Take 1 tablet (25 mg total) by mouth  at bedtime as needed for sleep. 30 tablet 1  . ANORO ELLIPTA 62.5-25 MCG/INH AEPB INHALE ONE PUFF EVERY MORNING. 60 each 5  . Ascorbic Acid (VITAMIN C) 1000 MG tablet Take 1,000 mg by mouth daily.    . cholecalciferol (VITAMIN D) 1000 UNITS tablet Take 5,000 Units by mouth daily.    . vitamin B-12 (CYANOCOBALAMIN) 100 MCG tablet Take 100 mcg by mouth daily.     No facility-administered medications prior to visit.          Objective:   Physical Exam Filed Vitals:   07/11/16 1328  BP: 102/66  Pulse: 78  Height: 5\' 3"  (1.6 m)  Weight: 129 lb (58.514 kg)  SpO2: 95%   Gen: Pleasant, well-nourished, in no distress,  normal affect  ENT: No lesions,  mouth clear,  oropharynx clear, no postnasal drip  Neck: No JVD, no TMG, no carotid bruits  Lungs: No use of accessory muscles, clear without rales or rhonchi  Cardiovascular: RRR, heart sounds normal, no murmur or gallops, no peripheral edema  Musculoskeletal: No deformities, no  cyanosis or clubbing  Neuro: alert, non focal  Skin: Warm, no lesions or rashes      Assessment & Plan:  COPD (chronic obstructive pulmonary disease) She feels that her dyspnea is more limiting now than before. I explained to her that some of this may be limitation from her significant COPD. Also concerned she is not moving air very well through her nasopharynx. We will try treating her with a nasal steroid as below. If this is not effective she may benefit from an ENT evaluation to look for obstruction. Walking oximetry today to ensure that she is not desaturating.  Chronic rhinitis Start nasal steroid and follow her progress  Hypoxemia Walking oximetry today  Frequent nocturnal awakening She is set for a sleep study. I'll follow up with her after this is performed.   Levy Pupa, MD, PhD 07/11/2016, 1:53 PM [facility] [phone] or if no answer [phone]

## 2016-07-11 NOTE — Assessment & Plan Note (Signed)
She feels that her dyspnea is more limiting now than before. I explained to her that some of this may be limitation from her significant COPD. Also concerned she is not moving air very well through her nasopharynx. We will try treating her with a nasal steroid as below. If this is not effective she may benefit from an ENT evaluation to look for obstruction. Walking oximetry today to ensure that she is not desaturating.

## 2016-07-21 ENCOUNTER — Ambulatory Visit (INDEPENDENT_AMBULATORY_CARE_PROVIDER_SITE_OTHER): Payer: Medicaid Other | Admitting: Family Medicine

## 2016-07-21 ENCOUNTER — Encounter: Payer: Self-pay | Admitting: Family Medicine

## 2016-07-21 VITALS — BP 124/76 | HR 75 | Temp 97.6°F | Resp 14 | Ht 63.0 in | Wt 129.0 lb

## 2016-07-21 DIAGNOSIS — G4700 Insomnia, unspecified: Secondary | ICD-10-CM

## 2016-07-21 DIAGNOSIS — Z1211 Encounter for screening for malignant neoplasm of colon: Secondary | ICD-10-CM

## 2016-07-21 NOTE — Patient Instructions (Signed)
We will let you know about sleep study results and recommendation when we get those.

## 2016-07-22 ENCOUNTER — Other Ambulatory Visit: Payer: Self-pay | Admitting: Emergency Medicine

## 2016-07-22 NOTE — Progress Notes (Signed)
Taylor Burnett, is a 65 y.o. female  UTM:[id number]  FKC:[id number]  DOB - 1951-07-07  CC:  Chief Complaint  Patient presents with  . Follow-up  . Foot Problem    burning in feet at night        HPI: Taylor Burnett is a 65 y.o. female here for follow-up insomnia. She was seen by Mrs. Hart Rochester about a month ago and was started on Elavil for insomnia. She reports it is not helping. She also reports snoring, restlness and possible episodes of apnea (reported by others). She reports burning of the feet at night. She has a history of Arthritis, COPD, DDD, GERD, osteoporosis, anxiety and panic attacks. She also has history of skin lesions which have been assessed by dermatology. She reports her mammogram is up to date. She does need a PAP but is recluctant as her last PAP was very painful. She is in need for Lipid panel and HIV. We will address the next time she needs other bloodwork.   Allergies  Allergen Reactions  . Prednisone Other (See Comments)    Depression and suicidal   Past Medical History:  Diagnosis Date  . Arthritis   . COPD (chronic obstructive pulmonary disease) (HCC)   . Cough   . DDD (degenerative disc disease), cervical   . Dysrhythmia    RAPID HEART BEAT OCC   . Emphysema of lung (HCC)   . GERD (gastroesophageal reflux disease)    OCC  . Headache   . Osteoporosis   . Panic attacks   . Shortness of breath dyspnea   . Spondylosis    Current Outpatient Prescriptions on File Prior to Visit  Medication Sig Dispense Refill  . albuterol (VENTOLIN HFA) 108 (90 Base) MCG/ACT inhaler Inhale 2 puffs into the lungs every 6 (six) hours as needed. 1 Inhaler 2  . ANORO ELLIPTA 62.5-25 MCG/INH AEPB INHALE ONE PUFF EVERY MORNING. 60 each 5  . Ascorbic Acid (VITAMIN C) 1000 MG tablet Take 1,000 mg by mouth daily.    . cholecalciferol (VITAMIN D) 1000 UNITS tablet Take 5,000 Units by mouth daily.    . fluticasone (FLONASE) 50 MCG/ACT nasal spray Place 2 sprays into both nostrils  daily. 16 g 5  . vitamin B-12 (CYANOCOBALAMIN) 100 MCG tablet Take 100 mcg by mouth daily.    Marland Kitchen amitriptyline (ELAVIL) 25 MG tablet Take 1 tablet (25 mg total) by mouth at bedtime as needed for sleep. (Patient not taking: Reported on 07/21/2016) 30 tablet 1   No current facility-administered medications on file prior to visit.    Family History  Problem Relation Age of Onset  . Emphysema Father   . Allergies Mother   . Allergies Son   . Allergies Brother   . Heart disease Maternal Grandmother   . Rheum arthritis Brother   . Cancer      aunts and uncles   Social History   Social History  . Marital status: Widowed    Spouse name: N/A  . Number of children: N/A  . Years of education: N/A   Occupational History  . on disability.    Social History Main Topics  . Smoking status: Former Smoker    Packs/day: 2.00    Years: 40.00    Types: Cigarettes    Quit date: 07/31/2013  . Smokeless tobacco: Never Used  . Alcohol use No     Comment: OCC WINE   . Drug use: No  . Sexual activity: Not on file  Other Topics Concern  . Not on file   Social History Narrative   Divorced and lives alone.     Review of Systems: Constitutional: Negative for fever, chills, appetite change, weight loss,  Fatigue. Skin: Negative for rashes or lesions of concern. HENT: Negative for ear pain, ear discharge.nose bleeds Eyes: Negative for pain, discharge, redness, itching and visual disturbance. Neck: Negative for pain, stiffness Respiratory: Negative for cough, shortness of breath,   Cardiovascular: Negative for chest pain, palpitations and leg swelling. Gastrointestinal: Negative for abdominal pain, nausea, vomiting, diarrhea, constipations Genitourinary: Negative for dysuria, urgency, frequency, hematuria,  Musculoskeletal: Negative for back pain, joint pain, joint  swelling, and gait problem.Negative for weakness. Neurological: Negative for dizziness, tremors, seizures, syncope,    light-headedness, numbness and headaches.  Hematological: Negative for easy bruising or bleeding Psychiatric/Behavioral: Negative for depression, anxiety, decreased concentration, confusion   Objective:   Vitals:   07/21/16 1321  BP: 124/76  Pulse: 75  Resp: 14  Temp: 97.6 F (36.4 C)    Physical Exam: Constitutional: Patient appears well-developed and well-nourished. No distress. HENT: Normocephalic, atraumatic, External right and left ear normal. Oropharynx is clear and moist.  Eyes: Conjunctivae and EOM are normal. PERRLA, no scleral icterus. Neck: Normal ROM. Neck supple. No lymphadenopathy, No thyromegaly. CVS: RRR, S1/S2 +, no murmurs, no gallops, no rubs Pulmonary: Effort and breath sounds normal, no stridor, rhonchi, wheezes, rales.  Abdominal: Soft. Normoactive BS,, no distension, tenderness, rebound or guarding.  Musculoskeletal: Normal range of motion. No edema and no tenderness.  Neuro: Alert.Normal muscle tone coordination. Non-focal Skin: Skin is warm and dry. No rash noted. Not diaphoretic. No erythema. No pallor. Psychiatric: Normal mood and affect. Behavior, judgment, thought content normal.  Lab Results  Component Value Date   WBC 5.5 06/06/2016   HGB 14.4 06/06/2016   HCT 43.2 06/06/2016   MCV 90.8 06/06/2016   PLT 249 06/06/2016   Lab Results  Component Value Date   CREATININE 0.78 06/06/2016   BUN 16 06/06/2016   NA 140 06/06/2016   K 4.4 06/06/2016   CL 102 06/06/2016   CO2 28 06/06/2016    Lab Results  Component Value Date   HGBA1C 5.8 (H) 06/06/2016   Lipid Panel  No results found for: CHOL, TRIG, HDL, CHOLHDL, VLDL, LDLCALC     Assessment and plan:   1. Insomnia -Discontinue Elavil -Sleep study.   Return in about 6 months (around 01/21/2017).  The patient was given clear instructions to go to ER or return to medical center if symptoms don't improve, worsen or new problems develop. The patient verbalized understanding.    Henrietta Hoover FNP  07/22/2016, 7:43 AM

## 2016-07-26 ENCOUNTER — Encounter: Payer: Self-pay | Admitting: Gastroenterology

## 2016-07-28 ENCOUNTER — Ambulatory Visit (HOSPITAL_BASED_OUTPATIENT_CLINIC_OR_DEPARTMENT_OTHER): Payer: Medicaid Other | Attending: Family Medicine | Admitting: Internal Medicine

## 2016-07-28 VITALS — Ht 63.0 in | Wt 130.0 lb

## 2016-07-28 DIAGNOSIS — G4719 Other hypersomnia: Secondary | ICD-10-CM | POA: Diagnosis not present

## 2016-07-28 DIAGNOSIS — Z79899 Other long term (current) drug therapy: Secondary | ICD-10-CM | POA: Insufficient documentation

## 2016-07-28 DIAGNOSIS — G4736 Sleep related hypoventilation in conditions classified elsewhere: Secondary | ICD-10-CM | POA: Insufficient documentation

## 2016-07-28 DIAGNOSIS — R0683 Snoring: Secondary | ICD-10-CM | POA: Diagnosis not present

## 2016-07-28 DIAGNOSIS — G4733 Obstructive sleep apnea (adult) (pediatric): Secondary | ICD-10-CM | POA: Diagnosis not present

## 2016-07-28 DIAGNOSIS — G4730 Sleep apnea, unspecified: Secondary | ICD-10-CM | POA: Diagnosis present

## 2016-07-28 DIAGNOSIS — J449 Chronic obstructive pulmonary disease, unspecified: Secondary | ICD-10-CM | POA: Insufficient documentation

## 2016-07-28 DIAGNOSIS — I493 Ventricular premature depolarization: Secondary | ICD-10-CM | POA: Insufficient documentation

## 2016-07-28 DIAGNOSIS — R51 Headache: Secondary | ICD-10-CM | POA: Diagnosis not present

## 2016-07-28 DIAGNOSIS — R5383 Other fatigue: Secondary | ICD-10-CM | POA: Insufficient documentation

## 2016-07-28 DIAGNOSIS — G4700 Insomnia, unspecified: Secondary | ICD-10-CM | POA: Diagnosis not present

## 2016-08-06 DIAGNOSIS — G4730 Sleep apnea, unspecified: Secondary | ICD-10-CM

## 2016-08-06 NOTE — Procedures (Signed)
   Patient Name: Taylor, Burnett Date: 07/28/2016 Gender: Female D.O.B: [birth year] Age (years): 64 Referring Provider: Julianne Handler Height (inches): 63 Interpreting Physician: Jetty Duhamel MD, ABSM Weight (lbs): 130 RPSGT: Shelah Lewandowsky BMI: 23 MRN: [id number] Neck Size: 13.50 CLINICAL INFORMATION Sleep Study Type: NPSG Indication for sleep study: COPD, Excessive Daytime Sleepiness, Fatigue, Morning Headaches, OSA, Witnessed Apneas Epworth Sleepiness Score: 4  SLEEP STUDY TECHNIQUE As per the AASM Manual for the Scoring of Sleep and Associated Events v2.3 (April 2016) with a hypopnea requiring 4% desaturations. The channels recorded and monitored were frontal, central and occipital EEG, electrooculogram (EOG), submentalis EMG (chin), nasal and oral airflow, thoracic and abdominal wall motion, anterior tibialis EMG, snore microphone, electrocardiogram, and pulse oximetry.  MEDICATIONS Patient's medications include: charted for review. Medications self-administered by patient during sleep study : ZYRTEC  SLEEP ARCHITECTURE The study was initiated at 10:35:33 PM and ended at 5:22:01 AM. Sleep onset time was 59.8 minutes and the sleep efficiency was 65.7%. The total sleep time was 267.0 minutes. Stage REM latency was 147.5 minutes. The patient spent 8.80% of the night in stage N1 sleep, 72.28% in stage N2 sleep, 0.00% in stage N3 and 18.91% in REM. Alpha intrusion was absent. Supine sleep was 71.98%. Wake after sleep onset  RESPIRATORY PARAMETERS The overall apnea/hypopnea index (AHI) was 12.6 per hour. There were 45 total apneas, including 45 obstructive, 0 central and 0 mixed apneas. There were 11 hypopneas and 27 RERAs. The AHI during Stage REM sleep was 49.9 per hour. AHI while supine was 17.5 per hour. The mean oxygen saturation was 91.74%. The minimum SpO2 during sleep was 74.00%. Moderate snoring was noted during this study.  CARDIAC DATA The 2  lead EKG demonstrated sinus rhythm. The mean heart rate was 61.35 beats per minute. Other EKG findings include: PVCs.  LEG MOVEMENT DATA The total PLMS were 10 with a resulting PLMS index of 2.25. Associated arousal with leg movement index was 1.8 .  IMPRESSIONS - Mild obstructive sleep apnea occurred during this study (AHI = 12.6/h). - No significant central sleep apnea occurred during this study (CAI = 0.0/h). - Moderate oxygen desaturation was noted during this study (Min O2 = 74.00%). - The patient snored with Moderate snoring volume. - Difficulty initiating and maintaining sleep- Insomnia - EKG findings include PVCs. - Clinically significant periodic limb movements did not occur during sleep. No significant associated arousals.  DIAGNOSIS - Obstructive Sleep Apnea (327.23 [G47.33 ICD-10]) - Nocturnal Hypoxemia (327.26 [G47.36 ICD-10]) - Insomnia  RECOMMENDATIONS - Therapeutic CPAP titration to determine optimal pressure required to alleviate sleep disordered breathing. - Positional therapy avoiding supine position during sleep. - Avoid alcohol, sedatives and other CNS depressants that may worsen sleep apnea and disrupt normal sleep architecture. - Sleep hygiene should be reviewed to assess factors that may improve sleep quality. - Weight management and regular exercise should be initiated or continued if appropriate.  [Electronically signed] 08/06/2016 09:03 AM  Jetty Duhamel MD, ABSM Diplomate, American Board of Sleep Medicine   NPI: [id number]  Waymon Budge Diplomate, American Board of Sleep Medicine  ELECTRONICALLY SIGNED ON:  08/06/2016, 8:58 AM [facility] PH: [phone]   FX: [phone] ACCREDITED BY THE AMERICAN ACADEMY OF SLEEP MEDICINE

## 2016-09-26 ENCOUNTER — Encounter: Payer: Medicaid Other | Admitting: Gastroenterology

## 2016-10-11 ENCOUNTER — Ambulatory Visit (INDEPENDENT_AMBULATORY_CARE_PROVIDER_SITE_OTHER): Payer: Medicaid Other | Admitting: Emergency Medicine

## 2016-10-11 ENCOUNTER — Encounter: Payer: Self-pay | Admitting: Emergency Medicine

## 2016-10-11 VITALS — BP 106/70 | HR 92 | Ht 63.0 in | Wt 133.2 lb

## 2016-10-11 DIAGNOSIS — J438 Other emphysema: Secondary | ICD-10-CM

## 2016-10-11 DIAGNOSIS — J301 Allergic rhinitis due to pollen: Secondary | ICD-10-CM

## 2016-10-11 DIAGNOSIS — R0600 Dyspnea, unspecified: Secondary | ICD-10-CM

## 2016-10-11 DIAGNOSIS — R0609 Other forms of dyspnea: Secondary | ICD-10-CM

## 2016-10-11 NOTE — Patient Instructions (Addendum)
Please continue your Anoro and albuterol as you have been taking them  Continue zyrtec and flonase as you are taking them  Please try to do nasal saline rinse once a day  We will repeat pulmonary function testing  We will refer you back to see Dr Allyson Sabal to insure that there is not a component of heart disease contributing to your breathing (in addition to COPD) Follow with Dr Delton Coombes in 1 month or next available

## 2016-10-11 NOTE — Progress Notes (Signed)
Subjective:    Patient ID: Taylor Burnett, female    DOB: 02-08-1951, 65 y.o.   MRN: [id number]  HPI 65 yo woman, former smoker, has been followed by Dr Shelle Ironlance for COPD. She has participated in the Pulm rehab program since last visit here > she didn't feel that she benefited. She felt that she got mixed messages from them regarding exercise and rest. She stopped after she almost fell while walking on treadmill. She has been managed on Anoro. She can tell it is wearing off by the end of the day.  She has been doing exercise at home. She feels that her breathing may be worse. She has chronic cough - can happen at any time, more in the am.  Non-productive, wants to be able to bring up secretions.  She uses albuterol daily, usually at night.   ROV 07/11/16 --  Follow-up visit for patient with a history of COPD, allergic rhinitis, last seen in January 2017. She notes that her breathing is not as good as it was a couple years ago, she has to stop to rest with exertion. She also has heavy breathing B lateral chest pain. Poor sleep. She is waking up frequently. She sometimes falls asleep during the day. She is using Anoro qd, has to miss it sometimes because she runs out. She uses albuterol about once a day. She took zyrtec for a while since last time. She isn't taking currently.   ROV 10/11/16 -- Patient has a history of COPD and allergic rhinitis. Last seen July 2017. She reports today tha since last time , more dyspnea w exertion, especially at the end of the day when she is fatigued. 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 She feels that she is coughing more, non-productive usually. Happens periodically during the day, can be at rest. More freq than at times in the past. She sometimes hears wheezing. She is congested without overt rhinitis. She has used albuterol more frequently. Occasional chest pain w exertion - she had L heart cath w Dr Allyson SabalBerry over a decade ago. Remains on Anoro. Believes that it  helps her. No new exposures. She is on zyrtec and flonase.    Review of Systems As per HPI  Past Medical History:  Diagnosis Date  . Arthritis   . COPD (chronic obstructive pulmonary disease) (HCC)   . Cough   . DDD (degenerative disc disease), cervical   . Dysrhythmia    RAPID HEART BEAT OCC   . Emphysema of lung (HCC)   . GERD (gastroesophageal reflux disease)    OCC  . Headache   . Osteoporosis   . Panic attacks   . Shortness of breath dyspnea   . Spondylosis      Family History  Problem Relation Age of Onset  . Emphysema Father   . Allergies Mother   . Allergies Son   . Allergies Brother   . Heart disease Maternal Grandmother   . Rheum arthritis Brother   . Cancer      aunts and uncles     Social History   Social History  . Marital status: Widowed    Spouse name: N/A  . Number of children: N/A  . Years of education: N/A   Occupational History  . on disability.    Social History Main Topics  . Smoking status: Former Smoker    Packs/day: 2.00    Years: 40.00    Types: Cigarettes  Quit date: 07/31/2013  . Smokeless tobacco: Never Used  . Alcohol use No     Comment: OCC WINE   . Drug use: No  . Sexual activity: Not on file   Other Topics Concern  . Not on file   Social History Narrative   Divorced and lives alone.      Allergies  Allergen Reactions  . Prednisone Other (See Comments)    Depression and suicidal     Outpatient Medications Prior to Visit  Medication Sig Dispense Refill  . albuterol (VENTOLIN HFA) 108 (90 Base) MCG/ACT inhaler Inhale 2 puffs into the lungs every 6 (six) hours as needed. 1 Inhaler 2  . ANORO ELLIPTA 62.5-25 MCG/INH AEPB INHALE ONE PUFF INTO THE LUNGS EVERY MORNING. 60 each 5  . Ascorbic Acid (VITAMIN C) 1000 MG tablet Take 1,000 mg by mouth daily.    . cholecalciferol (VITAMIN D) 1000 UNITS tablet Take 5,000 Units by mouth daily.    . fluticasone (FLONASE) 50 MCG/ACT nasal spray Place 2 sprays into both nostrils  daily. 16 g 5  . vitamin B-12 (CYANOCOBALAMIN) 100 MCG tablet Take 100 mcg by mouth daily.    Marland Kitchen amitriptyline (ELAVIL) 25 MG tablet Take 1 tablet (25 mg total) by mouth at bedtime as needed for sleep. (Patient not taking: Reported on 07/21/2016) 30 tablet 1   No facility-administered medications prior to visit.           Objective:   Physical Exam Vitals:   10/11/16 1401  BP: 106/70  Pulse: 92  SpO2: 90%  Weight: 133 lb 3.2 oz (60.4 kg)  Height: 5\' 3"  (1.6 m)   Gen: Pleasant, well-nourished, in no distress,  normal affect  ENT: No lesions,  mouth clear,  oropharynx clear, no postnasal drip  Neck: No JVD, no TMG, no carotid bruits  Lungs: No use of accessory muscles, clear without rales or rhonchi  Cardiovascular: RRR, heart sounds normal, no murmur or gallops, no peripheral edema  Musculoskeletal: No deformities, no cyanosis or clubbing  Neuro: alert, non focal  Skin: Warm, no lesions or rashes      Assessment & Plan:  DOE (dyspnea on exertion) Her dyspnea sounds most consistent with progression of her COPD, probably with some influence of worsening nasal congestion and rhinitis in the short-term. She has had a cardiology evaluation in the distant past but would like for her to do this again to ensure no cardiac component to progressive dyspnea.    We will repeat pulmonary function testing  We will refer you back to see Dr Allyson Sabal to insure that there is not a component of heart disease contributing to your breathing (in addition to COPD) Follow with Dr Delton Coombes in 1 month or next available   COPD (chronic obstructive pulmonary disease) Please continue your Anoro and albuterol as you have been taking them  Continue zyrtec and flonase as you are taking them  Please try to do nasal saline rinse once a day   Chronic rhinitis Continue zyrtec and flonase as you are taking them  Please try to do nasal saline rinse once a day  Levy Pupa, MD, PhD 10/11/2016, 2:21  PM [facility] [phone] or if no answer [phone]

## 2016-10-11 NOTE — Assessment & Plan Note (Signed)
Continue zyrtec and flonase as you are taking them  Please try to do nasal saline rinse once a day

## 2016-10-11 NOTE — Assessment & Plan Note (Signed)
Please continue your Anoro and albuterol as you have been taking them  Continue zyrtec and flonase as you are taking them  Please try to do nasal saline rinse once a day

## 2016-10-11 NOTE — Assessment & Plan Note (Signed)
Her dyspnea sounds most consistent with progression of her COPD, probably with some influence of worsening nasal congestion and rhinitis in the short-term. She has had a cardiology evaluation in the distant past but would like for her to do this again to ensure no cardiac component to progressive dyspnea.    We will repeat pulmonary function testing  We will refer you back to see Dr Allyson Sabal to insure that there is not a component of heart disease contributing to your breathing (in addition to COPD) Follow with Dr Delton Coombes in 1 month or next available

## 2016-10-31 ENCOUNTER — Other Ambulatory Visit: Payer: Self-pay | Admitting: Family Medicine

## 2016-10-31 ENCOUNTER — Encounter: Payer: Self-pay | Admitting: Family Medicine

## 2016-11-03 ENCOUNTER — Encounter: Payer: Self-pay | Admitting: Emergency Medicine

## 2016-11-03 MED ORDER — AZITHROMYCIN 250 MG PO TABS: ORAL_TABLET | ORAL | 0 refills | Status: DC

## 2016-11-03 NOTE — Telephone Encounter (Signed)
Called and spoke with pt d/t time of day and nature of email.  Pt has had a prod cough with yellow mucus Xfew days, low grade temp, fatigue.    Spoke with TP regarding recs, she states that pt needs lots of rest and fluids, mucinex DM BID, tylenol prn to help lower fever, and to call in a zpak.  Pt is to call office if symptoms don't improve, or to seek emergency care if symptoms worsen.  Spoke with pt,aware of above recs.  rx sent to preferred pharmacy.  Nothing further needed.

## 2016-11-14 ENCOUNTER — Encounter: Payer: Self-pay | Admitting: Emergency Medicine

## 2016-11-14 ENCOUNTER — Ambulatory Visit (HOSPITAL_COMMUNITY)
Admission: RE | Admit: 2016-11-14 | Discharge: 2016-11-14 | Disposition: A | Discharge status: Home / Self Care | Payer: Medicaid Other | Source: Ambulatory Visit | Attending: Emergency Medicine | Admitting: Emergency Medicine

## 2016-11-14 ENCOUNTER — Ambulatory Visit (INDEPENDENT_AMBULATORY_CARE_PROVIDER_SITE_OTHER): Payer: Medicaid Other | Admitting: Emergency Medicine

## 2016-11-14 VITALS — BP 100/78 | HR 89 | Ht 62.0 in | Wt 127.0 lb

## 2016-11-14 DIAGNOSIS — G4734 Idiopathic sleep related nonobstructive alveolar hypoventilation: Secondary | ICD-10-CM

## 2016-11-14 DIAGNOSIS — G4733 Obstructive sleep apnea (adult) (pediatric): Secondary | ICD-10-CM

## 2016-11-14 DIAGNOSIS — R0609 Other forms of dyspnea: Secondary | ICD-10-CM | POA: Diagnosis not present

## 2016-11-14 DIAGNOSIS — J438 Other emphysema: Secondary | ICD-10-CM | POA: Diagnosis not present

## 2016-11-14 DIAGNOSIS — J432 Centrilobular emphysema: Secondary | ICD-10-CM

## 2016-11-14 LAB — PULMONARY FUNCTION TEST
DL/VA % pred: 40 %
DL/VA: 1.85 ml/min/mmHg/L
DLCO unc % pred: 15 %
DLCO unc: 3.26 ml/min/mmHg
FEF 25-75 Post: 0.2 L/sec
FEF 25-75 Pre: 0.25 L/sec
FEF2575-%CHANGE-POST: -22 %
FEF2575-%Pred-Post: 9 %
FEF2575-%Pred-Pre: 12 %
FEV1-%CHANGE-POST: -2 %
FEV1-%PRED-POST: 24 %
FEV1-%PRED-PRE: 25 %
FEV1-PRE: 0.58 L
FEV1-Post: 0.56 L
FEV1FVC-%Change-Post: 22 %
FEV1FVC-%Pred-Pre: 54 %
FEV6-%Change-Post: -20 %
FEV6-%PRED-PRE: 47 %
FEV6-%Pred-Post: 37 %
FEV6-POST: 1.06 L
FEV6-PRE: 1.34 L
FEV6FVC-%Change-Post: 3 %
FEV6FVC-%PRED-POST: 104 %
FEV6FVC-%PRED-PRE: 100 %
FVC-%CHANGE-POST: -20 %
FVC-%PRED-PRE: 47 %
FVC-%Pred-Post: 37 %
FVC-POST: 1.1 L
FVC-PRE: 1.38 L
POST FEV6/FVC RATIO: 100 %
PRE FEV1/FVC RATIO: 42 %
PRE FEV6/FVC RATIO: 97 %
Post FEV1/FVC ratio: 51 %
RV % PRED: 186 %
RV: 3.68 L
TLC % PRED: 106 %
TLC: 5.04 L

## 2016-11-14 MED ORDER — ALBUTEROL SULFATE (2.5 MG/3ML) 0.083% IN NEBU
2.5000 mg | INHALATION_SOLUTION | Freq: Once | RESPIRATORY_TRACT | Status: AC
  Administered 2016-11-14: 2.5 mg via RESPIRATORY_TRACT

## 2016-11-14 NOTE — Addendum Note (Signed)
Addended by: Jaynee Eagles C on: 11/14/2016 11:58 AM   Modules accepted: Orders

## 2016-11-14 NOTE — Assessment & Plan Note (Signed)
Very severe obstruction but remarkably active in light of her PFT. Suspect she will need home O2, if not now then soon. Will repeat her ambulatory oximetry today. Continue Anoro + albuterol.

## 2016-11-14 NOTE — Assessment & Plan Note (Signed)
Based on PSG from earlier this year. Will send her for CPAP titration study. She requests something to help with sleep for the study. Will try to use something mild, ambien 5mg  to avoid influencing results significantly.

## 2016-11-14 NOTE — Addendum Note (Signed)
Addended by: Jaynee Eagles C on: 11/14/2016 11:57 AM   Modules accepted: Orders

## 2016-11-14 NOTE — Patient Instructions (Signed)
Please continue your Anoro as you are taking it.  Take albuterol 2 puffs up to every 4 hours if needed for shortness of breath.  We will repeat your walking oximetry today to see if you qualify for oxygen at home.  Continue your Zyrtec and Flonase as you are taking them .  We will arrange for you to have a CPAP titration study.  Take ambien 5mg  on the evening of the sleep test.  Follow with Dr Delton Coombes in 6 months or sooner if you have any problems

## 2016-11-14 NOTE — Assessment & Plan Note (Signed)
She has OV with Dr Allyson Sabal later this month to insure no coexisting CAD contributing to her SOB. I suspect that this is primarily her COPD.

## 2016-11-14 NOTE — Progress Notes (Signed)
Subjective:    Patient ID: Taylor Burnett, female    DOB: 1951/02/03, 65 y.o.   MRN: [id number]  HPI 65 yo woman, former smoker, has been followed by Dr Shelle Iron for COPD. She has participated in the Pulm rehab program since last visit here > she didn't feel that she benefited. She felt that she got mixed messages from them regarding exercise and rest. She stopped after she almost fell while walking on treadmill. She has been managed on Anoro. She can tell it is wearing off by the end of the day.  She has been doing exercise at home. She feels that her breathing may be worse. She has chronic cough - can happen at any time, more in the am.  Non-productive, wants to be able to bring up secretions.  She uses albuterol daily, usually at night.   ROV 07/11/16 --  Follow-up visit for patient with a history of COPD, allergic rhinitis, last seen in January 2017. She notes that her breathing is not as good as it was a couple years ago, she has to stop to rest with exertion. She also has heavy breathing B lateral chest pain. Poor sleep. She is waking up frequently. She sometimes falls asleep during the day. She is using Anoro qd, has to miss it sometimes because she runs out. She uses albuterol about once a day. She took zyrtec for a while since last time. She isn't taking currently.   ROV 10/11/16 -- Patient has a history of COPD and allergic rhinitis. Last seen July 2017. She reports today tha since last time , more dyspnea w exertion, especially at the end of the day when she is fatigued. 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 She feels that she is coughing more, non-productive usually. Happens periodically during the day, can be at rest. More freq than at times in the past. She sometimes hears wheezing. She is congested without overt rhinitis. She has used albuterol more frequently. Occasional chest pain w exertion - she had L heart cath w Dr Allyson Sabal over a decade ago. Remains on Anoro. Believes that it  helps her. No new exposures. She is on zyrtec and flonase.   ROV 11/14/16 -- follow up visit for COPD and allergic rhinitis. Mild OSA based on her PSG from 07/2016. At our last visit she described progressive exertional SOB, occasional wheeze. She has been managed on Anoro. We repeat her PFT today that I have reviewed > shows very severe obstruction without BD response, severe hyperinflation, decreased diffusion capacity.  She was treated with azithro earlier this month - now feels back to baseline. She does not have home O2. She uses albuterol rarely due to agitation and side effects. On flonase + zyrtec. Still some mucous but better than last time. She is to see Dr Allyson Sabal on 11/29.    Review of Systems As per HPI      Objective:   Physical Exam Vitals:   11/14/16 1112 11/14/16 1113  BP:  100/78  Pulse:  89  SpO2:  93%  Weight: 127 lb (57.6 kg)   Height: 5\' 2"  (1.575 m)    Gen: Pleasant, well-nourished, in no distress,  normal affect  ENT: No lesions,  mouth clear,  oropharynx clear, no postnasal drip  Neck: No JVD, no TMG, no carotid bruits  Lungs: No use of accessory muscles, clear without rales or rhonchi  Cardiovascular: RRR, heart sounds normal, no murmur or gallops, no peripheral edema  Musculoskeletal: No deformities, no cyanosis or clubbing  Neuro: alert, non focal  Skin: Warm, no lesions or rashes      Assessment & Plan:  COPD (chronic obstructive pulmonary disease) Very severe obstruction but remarkably active in light of her PFT. Suspect she will need home O2, if not now then soon. Will repeat her ambulatory oximetry today. Continue Anoro + albuterol.    Obstructive sleep apnea Based on PSG from earlier this year. Will send her for CPAP titration study. She requests something to help with sleep for the study. Will try to use something mild, ambien 5mg  to avoid influencing results significantly.   DOE (dyspnea on exertion) She has OV with Dr Allyson SabalBerry later this  month to insure no coexisting CAD contributing to her SOB. I suspect that this is primarily her COPD.   Levy Pupaobert Taylor Ade, MD, PhD 11/14/2016, 11:38 AM [facility] [phone] or if no answer [phone]

## 2016-11-23 ENCOUNTER — Ambulatory Visit (INDEPENDENT_AMBULATORY_CARE_PROVIDER_SITE_OTHER): Payer: Medicaid Other | Admitting: Cardiovascular Disease

## 2016-11-23 ENCOUNTER — Encounter: Payer: Self-pay | Admitting: Cardiovascular Disease

## 2016-11-23 VITALS — BP 108/68 | HR 85 | Ht 62.0 in | Wt 133.0 lb

## 2016-11-23 DIAGNOSIS — R0609 Other forms of dyspnea: Secondary | ICD-10-CM

## 2016-11-23 DIAGNOSIS — R0602 Shortness of breath: Secondary | ICD-10-CM

## 2016-11-23 NOTE — Assessment & Plan Note (Signed)
Mrs. Archuleta is a 66 year old female referred by Dr. Corliss Blacker for cardiovascular evaluation because of dyspnea on exertion. She does have 150-pack-year history of tobacco abuse having quit 2 years ago. I apparently did a heart catheterization on her in the 90s revealing no evidence of CAD. She gets occasional chest heaviness roughly once a month. I'm going go to the echocardiogram and a homograft Myoview stress test to rule out concomitant CAD contributing to her dyspnea.

## 2016-11-23 NOTE — Patient Instructions (Signed)
Medication Instructions: Your physician recommends that you continue on your current medications as directed. Please refer to the Current Medication list given to you today.  Testing/Procedures: Your physician has requested that you have an echocardiogram. Echocardiography is a painless test that uses sound waves to create images of your heart. It provides your doctor with information about the size and shape of your heart and how well your heart's chambers and valves are working. This procedure takes approximately one hour. There are no restrictions for this procedure.  Your physician has requested that you have a lexiscan myoview. For further information please visit [email or web address] Please follow instruction sheet, as given.   Follow-Up: Your physician recommends that you schedule a follow-up appointment with Dr. Allyson Sabal after testing.  Any Other Instructions will be listed below:  Pharmacologic Stress Electrocardiogram A pharmacologic stress electrocardiogram is a heart (cardiac) test that uses nuclear imaging to evaluate the blood supply to your heart. This test may also be called a pharmacologic stress electrocardiography. Pharmacologic means that a medicine is used to increase your heart rate and blood pressure.  This stress test is done to find areas of poor blood flow to the heart by determining the extent of coronary artery disease (CAD). Some people exercise on a treadmill, which naturally increases the blood flow to the heart. For those people unable to exercise on a treadmill, a medicine is used. This medicine stimulates your heart and will cause your heart to beat harder and more quickly, as if you were exercising.  Pharmacologic stress tests can help determine:  The adequacy of blood flow to your heart during increased levels of activity in order to clear you for discharge home.  The extent of coronary artery blockage caused by CAD.  Your prognosis if you have suffered a heart  attack.  The effectiveness of cardiac procedures done, such as an angioplasty, which can increase the circulation in your coronary arteries.  Causes of chest pain or pressure. LET [facility] CARE PROVIDER KNOW ABOUT:  Any allergies you have.  All medicines you are taking, including vitamins, herbs, eye drops, creams, and over-the-counter medicines.  Previous problems you or members of your family have had with the use of anesthetics.  Any blood disorders you have.  Previous surgeries you have had.  Medical conditions you have.  Possibility of pregnancy, if this applies.  If you are currently breastfeeding. RISKS AND COMPLICATIONS Generally, this is a safe procedure. However, as with any procedure, complications can occur. Possible complications include:  You develop pain or pressure in the following areas:  Chest.  Jaw or neck.  Between your shoulder blades.  Radiating down your left arm.  Headache.  Dizziness or light-headedness.  Shortness of breath.  Increased or irregular heartbeat.  Low blood pressure.  Nausea or vomiting.  Flushing.  Redness going up the arm and slight pain during injection of medicine.  Heart attack (rare). BEFORE THE PROCEDURE   Avoid all forms of caffeine for 24 hours before your test or as directed by your health care provider. This includes coffee, tea (even decaffeinated tea), caffeinated sodas, chocolate, cocoa, and certain pain medicines.  Follow your health care provider's instructions regarding eating and drinking before the test.  Take your medicines as directed at regular times with water unless instructed otherwise. Exceptions may include:  If you have diabetes, ask how you are to take your insulin or pills. It is common to adjust insulin dosing the morning of the test.  If you are  taking beta-blocker medicines, it is important to talk to your health care provider about these medicines well before the date of your  test. Taking beta-blocker medicines may interfere with the test. In some cases, these medicines need to be changed or stopped 24 hours or more before the test.  If you wear a nitroglycerin patch, it may need to be removed prior to the test. Ask your health care provider if the patch should be removed before the test.  If you use an inhaler for any breathing condition, bring it with you to the test.  If you are an outpatient, bring a snack so you can eat right after the stress phase of the test.  Do not smoke for 4 hours prior to the test or as directed by your health care provider.  Do not apply lotions, powders, creams, or oils on your chest prior to the test.  Wear comfortable shoes and clothing. Let your health care provider know if you were unable to complete or follow the preparations for your test. PROCEDURE   Multiple patches (electrodes) will be put on your chest. If needed, small areas of your chest may be shaved to get better contact with the electrodes. Once the electrodes are attached to your body, multiple wires will be attached to the electrodes, and your heart rate will be monitored.  An IV access will be started. A nuclear trace (isotope) is given. The isotope may be given intravenously, or it may be swallowed. Nuclear refers to several types of radioactive isotopes, and the nuclear isotope lights up the arteries so that the nuclear images are clear. The isotope is absorbed by your body. This results in low radiation exposure.  A resting nuclear image is taken to show how your heart functions at rest.  A medicine is given through the IV access.  A second scan is done about 1 hour after the medicine injection and determines how your heart functions under stress.  During this stress phase, you will be connected to an electrocardiogram machine. Your blood pressure and oxygen levels will be monitored. AFTER THE PROCEDURE   Your heart rate and blood pressure will be monitored  after the test.  You may return to your normal schedule, including diet,activities, and medicines, unless your health care provider tells you otherwise. This information is not intended to replace advice given to you by your health care provider. Make sure you discuss any questions you have with your health care provider. Document Released: 04/30/2009 Document Revised: 12/17/2013 Document Reviewed: 08/19/2013 Elsevier Interactive Patient Education  2017 [employer]   If you need a refill on your cardiac medications before your next appointment, please call your pharmacy.

## 2016-11-23 NOTE — Progress Notes (Signed)
11/23/2016 Taylor Burnett   February 11, 1951  [id number]  Primary Physician Massie Maroon, FNP Primary Cardiologist: Runell Gess MD Roseanne Reno  HPI:  Taylor Burnett is a 65 year old mildly overweight widowed Caucasian female mother of 3, grandmother 5 grandchildren who is accompanied by her daughter Taylor Burnett. She was referred by Dr. Corliss Blacker for cardiovascular evaluation because of dyspnea on exertion to rule out coexisting CAD as an etiology in addition to emphysema related to 150-pack-years of tobacco abuse. She has no other cardiac risk factors. She's never had a heart attack or stroke. She does get occasional chest pain on a monthly basis) substernal chest pressure.   Current Outpatient Prescriptions  Medication Sig Dispense Refill  . albuterol (VENTOLIN HFA) 108 (90 Base) MCG/ACT inhaler Inhale 2 puffs into the lungs every 6 (six) hours as needed. 1 Inhaler 2  . ANORO ELLIPTA 62.5-25 MCG/INH AEPB INHALE ONE PUFF INTO THE LUNGS EVERY MORNING. 60 each 5  . Ascorbic Acid (VITAMIN C) 1000 MG tablet Take 1,000 mg by mouth daily.    Marland Kitchen azithromycin (ZITHROMAX) 250 MG tablet Take 2 today, then 1 daily until gone. 6 tablet 0  . cetirizine (ZYRTEC) 10 MG tablet Take 10 mg by mouth daily.    . cholecalciferol (VITAMIN D) 1000 UNITS tablet Take 5,000 Units by mouth daily.    . fluticasone (FLONASE) 50 MCG/ACT nasal spray Place 2 sprays into both nostrils daily. 16 g 5  . vitamin B-12 (CYANOCOBALAMIN) 100 MCG tablet Take 100 mcg by mouth daily.     No current facility-administered medications for this visit.     Allergies  Allergen Reactions  . Prednisone Other (See Comments)    Depression and suicidal    Social History   Social History  . Marital status: Widowed    Spouse name: N/A  . Number of children: N/A  . Years of education: N/A   Occupational History  . on disability.    Social History Main Topics  . Smoking status: Former Smoker    Packs/day: 2.00    Years:  40.00    Types: Cigarettes    Quit date: 07/31/2013  . Smokeless tobacco: Never Used  . Alcohol use No     Comment: OCC WINE   . Drug use: No  . Sexual activity: Not on file   Other Topics Concern  . Not on file   Social History Narrative   Divorced and lives alone.      Review of Systems: General: negative for chills, fever, night sweats or weight changes.  Cardiovascular: negative for chest pain, dyspnea on exertion, edema, orthopnea, palpitations, paroxysmal nocturnal dyspnea or shortness of breath Dermatological: negative for rash Respiratory: negative for cough or wheezing Urologic: negative for hematuria Abdominal: negative for nausea, vomiting, diarrhea, bright red blood per rectum, melena, or hematemesis Neurologic: negative for visual changes, syncope, or dizziness All other systems reviewed and are otherwise negative except as noted above.    Blood pressure 108/68, pulse 85, height 5\' 2"  (1.575 m), weight 133 lb (60.3 kg).  General appearance: alert and no distress Neck: no adenopathy, no carotid bruit, no JVD, supple, symmetrical, trachea midline and thyroid not enlarged, symmetric, no tenderness/mass/nodules Lungs: clear to auscultation bilaterally Heart: regular rate and rhythm, S1, S2 normal, no murmur, click, rub or gallop Extremities: extremities normal, atraumatic, no cyanosis or edema  EKG sinus rhythm at 85 with septal Q waves. I personally reviewed this EKG  ASSESSMENT AND PLAN:  DOE (dyspnea on exertion) Taylor Burnett is a 65 year old female referred by Dr. Corliss BlackerByram for cardiovascular evaluation because of dyspnea on exertion. She does have 150-pack-year history of tobacco abuse having quit 2 years ago. I apparently did a heart catheterization on her in the 90s revealing no evidence of CAD. She gets occasional chest heaviness roughly once a month. I'm going go to the echocardiogram and a homograft Myoview stress test to rule out concomitant CAD contributing to  her dyspnea.      Runell GessJonathan J. Srihith Aquilino MD FACP,FACC,FAHA, [facility] 11/23/2016 2:52 PM

## 2016-11-28 ENCOUNTER — Encounter: Payer: Self-pay | Admitting: Emergency Medicine

## 2016-11-28 DIAGNOSIS — J438 Other emphysema: Secondary | ICD-10-CM

## 2016-12-01 ENCOUNTER — Telehealth (HOSPITAL_COMMUNITY): Payer: Self-pay

## 2016-12-01 NOTE — Telephone Encounter (Signed)
Encounter complete. 

## 2016-12-06 ENCOUNTER — Ambulatory Visit (HOSPITAL_COMMUNITY)
Admission: RE | Admit: 2016-12-06 | Discharge: 2016-12-06 | Disposition: A | Discharge status: Home / Self Care | Payer: Medicaid Other | Source: Ambulatory Visit | Attending: Internal Medicine | Admitting: Internal Medicine

## 2016-12-06 DIAGNOSIS — J449 Chronic obstructive pulmonary disease, unspecified: Secondary | ICD-10-CM | POA: Diagnosis not present

## 2016-12-06 DIAGNOSIS — R5383 Other fatigue: Secondary | ICD-10-CM | POA: Insufficient documentation

## 2016-12-06 DIAGNOSIS — R0609 Other forms of dyspnea: Secondary | ICD-10-CM | POA: Diagnosis not present

## 2016-12-06 DIAGNOSIS — R0602 Shortness of breath: Secondary | ICD-10-CM | POA: Diagnosis not present

## 2016-12-06 DIAGNOSIS — Z87891 Personal history of nicotine dependence: Secondary | ICD-10-CM | POA: Diagnosis not present

## 2016-12-06 LAB — MYOCARDIAL PERFUSION IMAGING
CHL CUP NUCLEAR SDS: 2
CHL CUP RESTING HR STRESS: 76 {beats}/min
LVDIAVOL: 55 mL (ref 46–106)
LVSYSVOL: 12 mL
NUC STRESS TID: 0.93
Peak HR: 112 {beats}/min
SRS: 7
SSS: 9

## 2016-12-06 MED ORDER — REGADENOSON 0.4 MG/5ML IV SOLN
0.4000 mg | Freq: Once | INTRAVENOUS | Status: AC
  Administered 2016-12-06: 0.4 mg via INTRAVENOUS

## 2016-12-06 MED ORDER — TECHNETIUM TC 99M SESTAMIBI GENERIC - CARDIOLITE
29.4000 | Freq: Once | INTRAVENOUS | Status: AC | PRN
  Administered 2016-12-06: 29.4 via INTRAVENOUS
  Filled 2016-12-06: qty 30

## 2016-12-06 MED ORDER — AMINOPHYLLINE 25 MG/ML IV SOLN
75.0000 mg | Freq: Once | INTRAVENOUS | Status: AC
  Administered 2016-12-06: 75 mg via INTRAVENOUS

## 2016-12-06 MED ORDER — TECHNETIUM TC 99M TETROFOSMIN IV KIT
9.7000 | PACK | Freq: Once | INTRAVENOUS | Status: AC | PRN
  Administered 2016-12-06: 9.7 via INTRAVENOUS
  Filled 2016-12-06: qty 10

## 2016-12-07 ENCOUNTER — Telehealth: Payer: Self-pay | Admitting: Emergency Medicine

## 2016-12-07 NOTE — Telephone Encounter (Signed)
LM for call back for [employer]

## 2016-12-09 NOTE — Telephone Encounter (Signed)
Called [employer] and they received this form yesterday. Nothing further is needed.

## 2016-12-15 ENCOUNTER — Ambulatory Visit (HOSPITAL_COMMUNITY): Payer: Medicaid Other | Attending: Internal Medicine

## 2016-12-15 ENCOUNTER — Other Ambulatory Visit: Payer: Self-pay

## 2016-12-15 DIAGNOSIS — G4733 Obstructive sleep apnea (adult) (pediatric): Secondary | ICD-10-CM | POA: Insufficient documentation

## 2016-12-15 DIAGNOSIS — Z87891 Personal history of nicotine dependence: Secondary | ICD-10-CM | POA: Insufficient documentation

## 2016-12-15 DIAGNOSIS — J449 Chronic obstructive pulmonary disease, unspecified: Secondary | ICD-10-CM | POA: Insufficient documentation

## 2016-12-15 DIAGNOSIS — R0602 Shortness of breath: Secondary | ICD-10-CM | POA: Insufficient documentation

## 2016-12-15 DIAGNOSIS — R0600 Dyspnea, unspecified: Secondary | ICD-10-CM | POA: Diagnosis present

## 2016-12-15 DIAGNOSIS — I5189 Other ill-defined heart diseases: Secondary | ICD-10-CM | POA: Insufficient documentation

## 2016-12-22 ENCOUNTER — Encounter: Payer: Self-pay | Admitting: Cardiovascular Disease

## 2017-01-03 ENCOUNTER — Encounter: Payer: Self-pay | Admitting: Emergency Medicine

## 2017-01-03 MED ORDER — ZOLPIDEM TARTRATE 10 MG PO TABS: ORAL_TABLET | ORAL | 0 refills | Status: DC

## 2017-01-03 NOTE — Telephone Encounter (Signed)
Spoke with RB who verified it is ok to call in 1 tablet ambien to be taken prior to sleep study.  This has been called in to pt preferred pharmacy.

## 2017-01-04 ENCOUNTER — Other Ambulatory Visit: Payer: Self-pay | Admitting: Emergency Medicine

## 2017-01-05 ENCOUNTER — Ambulatory Visit (HOSPITAL_BASED_OUTPATIENT_CLINIC_OR_DEPARTMENT_OTHER): Payer: Medicaid Other | Attending: Emergency Medicine | Admitting: Pulmonary Disease

## 2017-01-05 DIAGNOSIS — J439 Emphysema, unspecified: Secondary | ICD-10-CM | POA: Insufficient documentation

## 2017-01-05 DIAGNOSIS — J432 Centrilobular emphysema: Secondary | ICD-10-CM

## 2017-01-05 DIAGNOSIS — G4734 Idiopathic sleep related nonobstructive alveolar hypoventilation: Secondary | ICD-10-CM

## 2017-01-05 DIAGNOSIS — Z79899 Other long term (current) drug therapy: Secondary | ICD-10-CM | POA: Insufficient documentation

## 2017-01-05 DIAGNOSIS — G4733 Obstructive sleep apnea (adult) (pediatric): Secondary | ICD-10-CM | POA: Insufficient documentation

## 2017-01-16 DIAGNOSIS — J439 Emphysema, unspecified: Secondary | ICD-10-CM | POA: Insufficient documentation

## 2017-01-16 DIAGNOSIS — G4734 Idiopathic sleep related nonobstructive alveolar hypoventilation: Secondary | ICD-10-CM | POA: Insufficient documentation

## 2017-01-16 NOTE — Procedures (Signed)
    Patient Name: Taylor, Burnett Date: 01/05/2017   Gender: Female  D.O.B: May 15, 1951  Age (years): 65  Referring Provider: Levy Pupa   Height (inches): 63  Interpreting Physician: Coralyn Helling MD, ABSM  Weight (lbs): 130  RPSGT: Cherylann Parr   BMI: 23  MRN: [id number]  Neck Size: 14.00     CLINICAL INFORMATION  The patient is referred for a CPAP titration to treat sleep apnea. She has history of COPD. Date of NPSG, Split Night or HST: 07/28/16 - AHI 12.6, SaO2 low 74%.  SLEEP STUDY TECHNIQUE  As per the AASM Manual for the Scoring of Sleep and Associated Events v2.3 (April 2016) with a hypopnea requiring 4% desaturations.  The channels recorded and monitored were frontal, central and occipital EEG, electrooculogram (EOG), submentalis EMG (chin), nasal and oral airflow, thoracic and abdominal wall motion, anterior tibialis EMG, snore microphone, electrocardiogram, and pulse oximetry. Continuous positive airway pressure (CPAP) was initiated at the beginning of the study and titrated to treat sleep-disordered breathing.  MEDICATIONS  Medications self-administered by patient taken the night of the study : ZYRTEC, AMBIEN   TECHNICIAN COMMENTS  Comments added by technician: Patient had difficulty initiating sleep.  Comments added by scorer: N/A   RESPIRATORY PARAMETERS   Optimal PAP Pressure (cm): 15 AHI at Optimal Pressure (/hr): 3.1  Overall Minimal O2 (%): 67.00 Supine % at Optimal Pressure (%): 100  Minimal O2 at Optimal Pressure (%): 78.0     SLEEP ARCHITECTURE  The study was initiated at 9:52:51 PM and ended at 4:44:00 AM.  Sleep onset time was 20.9 minutes and the sleep efficiency was 89.7%. The total sleep time was 368.8 minutes.  The patient spent 2.58% of the night in stage N1 sleep, 76.00% in stage N2 sleep, 0.14% in stage N3 and 21.29% in REM.Stage REM latency was 101.0 minutes  Wake after sleep onset was 21.5. Alpha intrusion was absent. Supine sleep was  62.85%.  CARDIAC DATA  The 2 lead EKG demonstrated sinus rhythm. The mean heart rate was 0.11 beats per minute. Other EKG findings include: None.   LEG MOVEMENT DATA  The total Periodic Limb Movements of Sleep (PLMS) were 0. The PLMS index was 0.00. A PLMS index of <15 is considered normal in adults.  IMPRESSIONS  - Her obstructive sleep apnea appeared reasonably controlled with CPAP at 15 cm H2O.  - She had continued oxygen desaturation in spite of controlling her sleep apnea with CPAP. She was not tried on supplemental oxygen during this titration study.  - She spent greater than 5 minutes of test time with an SpO2 < 88% in the absence of other respiratory events.  DIAGNOSIS  - Obstructive Sleep Apnea (G47.33)  - COPD with emphysema (J43.9)  - Sleep related hypoxia (G47.34)  RECOMMENDATIONS  - Trial of CPAP therapy on 15 cm H2O.  - She was fitted with a Large size Fisher[employer]kel Full Face Mask Simplus mask and heated humidification.  - She should undergo overnight oximetry with CPAP 15 cm H2O to assess whether she would benefit from supplemental oxygen in addition to CPAP at night, or if she needs to return to the sleep lab for additional titration study using CPAP or BiPAP +/- supplemental oxygen.   [Electronically signed] 01/16/2017 04:02 PM Coralyn Helling MD, ABSM  Diplomate, American Board of Sleep Medicine  NPI: [id number]

## 2017-01-18 ENCOUNTER — Encounter: Payer: Self-pay | Admitting: Cardiovascular Disease

## 2017-01-18 ENCOUNTER — Ambulatory Visit (INDEPENDENT_AMBULATORY_CARE_PROVIDER_SITE_OTHER): Payer: Medicaid Other | Admitting: Cardiovascular Disease

## 2017-01-18 DIAGNOSIS — R0609 Other forms of dyspnea: Secondary | ICD-10-CM | POA: Diagnosis not present

## 2017-01-18 NOTE — Assessment & Plan Note (Signed)
Taylor Burnett returns for follow-up of her outpatient diagnostic tests performed in evaluation of dyspnea. A 2-D echocardiogram was completely normal as was a Myoview stress test. I think that her pulmonary issues are completely related to her prior history of tobacco abuse. I will see her back as needed.

## 2017-01-18 NOTE — Patient Instructions (Signed)
Medication Instructions: Your physician recommends that you continue on your current medications as directed. Please refer to the Current Medication list given to you today.   Follow-Up: Your physician recommends that you schedule a follow-up appointment as needed with Dr. Berry.  If you need a refill on your cardiac medications before your next appointment, please call your pharmacy.  

## 2017-01-18 NOTE — Progress Notes (Signed)
Taylor Burnett returns for follow-up of her outpatient diagnostic tests performed in evaluation of dyspnea. A 2-D echocardiogram was completely normal as was a Myoview stress test. I think that her pulmonary issues are completely related to her prior history of tobacco abuse. I will see her back as needed. 

## 2017-01-23 ENCOUNTER — Ambulatory Visit (INDEPENDENT_AMBULATORY_CARE_PROVIDER_SITE_OTHER): Payer: Medicaid Other | Admitting: Family Medicine

## 2017-01-23 ENCOUNTER — Encounter: Payer: Self-pay | Admitting: Family Medicine

## 2017-01-23 VITALS — BP 126/82 | HR 83 | Temp 97.8°F | Resp 18 | Ht 62.0 in | Wt 134.0 lb

## 2017-01-23 DIAGNOSIS — R7303 Prediabetes: Secondary | ICD-10-CM

## 2017-01-23 DIAGNOSIS — G4730 Sleep apnea, unspecified: Secondary | ICD-10-CM | POA: Diagnosis not present

## 2017-01-23 DIAGNOSIS — G4709 Other insomnia: Secondary | ICD-10-CM | POA: Diagnosis not present

## 2017-01-23 DIAGNOSIS — Z114 Encounter for screening for human immunodeficiency virus [HIV]: Secondary | ICD-10-CM

## 2017-01-23 DIAGNOSIS — F5101 Primary insomnia: Secondary | ICD-10-CM

## 2017-01-23 LAB — POCT GLYCOSYLATED HEMOGLOBIN (HGB A1C): HEMOGLOBIN A1C: 5.4

## 2017-01-23 MED ORDER — ESZOPICLONE 1 MG PO TABS: 1.0000 mg | ORAL_TABLET | Freq: Every evening | ORAL | 0 refills | Status: DC | PRN

## 2017-01-23 NOTE — Progress Notes (Signed)
Subjective:    Patient ID: Taylor Burnett, female    DOB: Aug 31, 1951, 66 y.o.   MRN: [id number]  HPI Taylor Burnett, a 66 year old female with a history of COPD presents to for a follow up of insomnia. SShe has been followed by Dr. Levy Pupa for COPD. Taylor Burnett is a former smoker. Symptoms of COPD have been Managed on Anoro Ellipta.  Patient presents with  insomnia and excessive daytime sleepiness. She was referred for a sleep study and found to have obstructive sleep apnea. She says that she has not started a CPAP. She says that she is getting 2-3 hours of sleep per night. She describes sleep quality as poor.  Snoring of moderate severity is present. She says that her brother has witnessed apneic episodes. She started a trial of Elavil for insomnia with unacceptable relief. . She reports fatigue. She denies headaches, SOB, dysuria, nausea, vomiting, or diarrhea.   Past Medical History:  Diagnosis Date  . Arthritis   . COPD (chronic obstructive pulmonary disease) (HCC)   . Cough   . DDD (degenerative disc disease), cervical   . Dysrhythmia    RAPID HEART BEAT OCC   . Emphysema of lung (HCC)   . GERD (gastroesophageal reflux disease)    OCC  . Headache   . Osteoporosis   . Panic attacks   . Shortness of breath dyspnea   . Spondylosis         Immunization History  Administered Date(s) Administered  . Tdap 06/06/2016   Past Surgical History:  Procedure Laterality Date  . ANTERIOR CERVICAL DECOMP/DISCECTOMY FUSION N/A 03/13/2015   Procedure: CERVICAL THREE-CERVICAL FOUR, CERVICAL FOUR-CERVICAL FIVE, CERVICAL FIVE-CERVICAL SIX ANTERIOR CERVICAL DECOMPRESSION/DISCECTOMY FUSION 3 LEVELS;  Surgeon: Tia Alert, MD;  Location: [facility];  Service: Neurosurgery;  Laterality: N/A;  . BREAST ENHANCEMENT SURGERY  1990  . CARDIAC CATHETERIZATION    . TONSILLECTOMY    . TUBAL LIGATION     Review of Systems  HENT: Negative.   Eyes: Negative.   Respiratory: Positive for apnea.  Negative for cough and shortness of breath.   Cardiovascular: Positive for palpitations. Negative for chest pain and leg swelling.  Gastrointestinal: Negative.   Endocrine: Negative.   Genitourinary: Negative.   Musculoskeletal: Negative.   Allergic/Immunologic: Negative.  Negative for immunocompromised state.  Neurological: Negative.   Hematological: Negative.   Psychiatric/Behavioral: Positive for sleep disturbance. Negative for behavioral problems and suicidal ideas. The patient is nervous/anxious (occasional anxiety).         Objective:   Physical Exam  Constitutional: She is oriented to person, place, and time. She appears well-developed and well-nourished.  HENT:  Head: Normocephalic and atraumatic.  Right Ear: External ear normal.  Left Ear: External ear normal.  Nose: Nose normal.  Mouth/Throat: Oropharynx is clear and moist.  Eyes: Conjunctivae and EOM are normal. Pupils are equal, round, and reactive to light.  Neck: Normal range of motion. Neck supple.  Cardiovascular: Normal rate, regular rhythm, normal heart sounds and intact distal pulses.   Pulmonary/Chest: Effort normal and breath sounds normal.  Abdominal: Soft. Bowel sounds are normal.  Musculoskeletal: Normal range of motion.  Neurological: She is alert and oriented to person, place, and time. She has normal reflexes.  Skin: Skin is warm, dry and intact.  Psychiatric: She has a normal mood and affect. Her behavior is normal. Judgment and thought content normal.       BP 126/82 (BP Location: Left Arm, Patient  Position: Sitting, Cuff Size: Normal)   Pulse 83   Temp 97.8 F (36.6 C) (Oral)   Resp 18   Ht 5\' 2"  (1.575 m)   Wt 134 lb (60.8 kg)   SpO2 91%   BMI 24.51 kg/m  Assessment & Plan:   1. Other insomnia Will start a trial of Lunesta for insomnia. Will follow up by phone. Sleep hygiene discussed at length.  - eszopiclone (LUNESTA) 1 MG TABS tablet; Take 1 tablet (1 mg total) by mouth at bedtime as  needed for sleep. Take immediately before bedtime  Dispense: 30 tablet; Refill: 0  2. Sleep apnea in adult Reviewed notes from sleep study. Will contact the sleep center for further instruction.   3. Prediabetes Hemoglobin a1c has improved to 5.4. Recommend a lowfat, low carbohydrate diet divided over 5-6 small meals, increase water intake to 6-8 glasses, and 150 minutes per week of cardiovascular exercise.   - HgB A1c  4. Screening for HIV (human immunodeficiency virus)  - HIV antibody (with reflex)    Routine Health Maintenance: Screening Colonoscopy Recommend breast cancer screening Pap smear 1 year ago/ normal per patient   RTC: 6 months for insomnia Syretta Kochel M, FNP

## 2017-01-24 LAB — HIV ANTIBODY (ROUTINE TESTING W REFLEX): HIV: NONREACTIVE

## 2017-01-25 ENCOUNTER — Encounter: Payer: Self-pay | Admitting: Family Medicine

## 2017-01-31 ENCOUNTER — Encounter: Payer: Self-pay | Admitting: Family Medicine

## 2017-02-07 ENCOUNTER — Encounter: Payer: Self-pay | Admitting: Emergency Medicine

## 2017-02-07 DIAGNOSIS — G4733 Obstructive sleep apnea (adult) (pediatric): Secondary | ICD-10-CM

## 2017-02-07 MED ORDER — UMECLIDINIUM-VILANTEROL 62.5-25 MCG/INH IN AEPB: 1.0000 | INHALATION_SPRAY | Freq: Every day | RESPIRATORY_TRACT | 5 refills | Status: DC

## 2017-02-07 NOTE — Telephone Encounter (Signed)
Patient is requesting sleep study results.  Sleep study in Epic. Please advise.  Thanks!

## 2017-02-10 NOTE — Telephone Encounter (Signed)
Please let her know that her sleep study showed sleep apnea and that they recommended CPAP. If she is willing to wear then please order as below:    - CPAP therapy on 15 cm H2O.   - Large size [employer] Full Face Mask Simplus mask and heated humidification.

## 2017-03-01 ENCOUNTER — Encounter: Payer: Self-pay | Admitting: Emergency Medicine

## 2017-03-01 NOTE — Telephone Encounter (Addendum)
RB- pt states DME is asking if she needs o2 bled in with CPAP. I looked back at recs from the PSG report and it stated she may need ONO on CPAP to see if she needs o2. I am guessing that is why they are asking. Please advise if you want the ONO done, or if she should just continue on CPAP alone, thanks

## 2017-03-23 NOTE — Telephone Encounter (Signed)
Agree that she needs an ONO on CPAP after she has been using for a couple of months. If she desats then we will add O2

## 2017-04-18 ENCOUNTER — Ambulatory Visit (INDEPENDENT_AMBULATORY_CARE_PROVIDER_SITE_OTHER): Payer: Medicaid Other | Admitting: Emergency Medicine

## 2017-04-18 ENCOUNTER — Encounter: Payer: Self-pay | Admitting: Emergency Medicine

## 2017-04-18 DIAGNOSIS — J432 Centrilobular emphysema: Secondary | ICD-10-CM | POA: Diagnosis not present

## 2017-04-18 DIAGNOSIS — G4730 Sleep apnea, unspecified: Secondary | ICD-10-CM

## 2017-04-18 DIAGNOSIS — J9610 Chronic respiratory failure, unspecified whether with hypoxia or hypercapnia: Secondary | ICD-10-CM | POA: Insufficient documentation

## 2017-04-18 NOTE — Assessment & Plan Note (Signed)
We will try using the nasal mask with your CPAP and get you a chin strap to eliminate mouth leakage.  Once you have been on CPAP reliably for a few months then we will perform a nighttime oximetry on the mask to see if you need oxygen added to the system.

## 2017-04-18 NOTE — Progress Notes (Signed)
Subjective:    Patient ID: Taylor Burnett, female    DOB: May 16, 1951, 66 y.o.   MRN: [id number]  HPI 66 yo woman, former smoker, has been followed by Dr Shelle Iron for COPD. She has participated in the Pulm rehab program since last visit here > she didn't feel that she benefited. She felt that she got mixed messages from them regarding exercise and rest. She stopped after she almost fell while walking on treadmill. She has been managed on Anoro. She can tell it is wearing off by the end of the day.  She has been doing exercise at home. She feels that her breathing may be worse. She has chronic cough - can happen at any time, more in the am.  Non-productive, wants to be able to bring up secretions.  She uses albuterol daily, usually at night.   ROV 07/11/16 --  Follow-up visit for patient with a history of COPD, allergic rhinitis, last seen in January 2017. She notes that her breathing is not as good as it was a couple years ago, she has to stop to rest with exertion. She also has heavy breathing B lateral chest pain. Poor sleep. She is waking up frequently. She sometimes falls asleep during the day. She is using Anoro qd, has to miss it sometimes because she runs out. She uses albuterol about once a day. She took zyrtec for a while since last time. She isn't taking currently.   ROV 10/11/16 -- Patient has a history of COPD and allergic rhinitis. Last seen July 2017. She reports today tha since last time , more dyspnea w exertion, especially at the end of the day when she is fatigued. 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 She feels that she is coughing more, non-productive usually. Happens periodically during the day, can be at rest. More freq than at times in the past. She sometimes hears wheezing. She is congested without overt rhinitis. She has used albuterol more frequently. Occasional chest pain w exertion - she had L heart cath w Dr Allyson Sabal over a decade ago. Remains on Anoro. Believes that it  helps her. No new exposures. She is on zyrtec and flonase.   ROV 11/14/16 -- follow up visit for COPD and allergic rhinitis. Mild OSA based on her PSG from 07/2016. At our last visit she described progressive exertional SOB, occasional wheeze. She has been managed on Anoro. We repeat her PFT today that I have reviewed > shows very severe obstruction without BD response, severe hyperinflation, decreased diffusion capacity.  She was treated with azithro earlier this month - now feels back to baseline. She does not have home O2. She uses albuterol rarely due to agitation and side effects. On flonase + zyrtec. Still some mucous but better than last time. She is to see Dr Allyson Sabal on 11/29.   ROV 04/18/17 -- patient has a history of COPD (very severe obstruction), allergic rhinitis, obstructive sleep apnea. We performed a repeat CPAP titration study since her last visit, recommended a trial of CPAP 15 cm water. She seems to be tolerating her CPAP fairly well but having a lot of mouth and lip dryness even with the humidity on. She initially was on a nasal mask but had leakage when she opened her mouth. She is compliant with her O2. She is on Anoro, believes that she gets gets more sob at night. She remains fairly active, has slowed down some - no longer tends to her chickens.  She isn';t great about wearing her O2 w exertion. She is on zyrtec and flonase - allergies still give her some sx, cough, sneezing. She is planning to have cataract sgy.    Review of Systems As per HPI      Objective:   Physical Exam Vitals:   04/18/17 1458  BP: 102/60  Pulse: 84  SpO2: 92%  Weight: 139 lb 3.2 oz (63.1 kg)  Height: 5\' 2"  (1.575 m)   Gen: Pleasant, well-nourished, in no distress,  normal affect  ENT: No lesions,  mouth clear,  oropharynx clear, no postnasal drip  Neck: No JVD, no TMG, no carotid bruits  Lungs: No use of accessory muscles, clear without rales or rhonchi  Cardiovascular: RRR, heart sounds  normal, no murmur or gallops, no peripheral edema  Musculoskeletal: No deformities, no cyanosis or clubbing  Neuro: alert, non focal  Skin: Warm, no lesions or rashes      Assessment & Plan:  COPD (chronic obstructive pulmonary disease) with emphysema (HCC) Please continue your Anoro once a day Take albuterol 2 puffs up to every 4 hours if needed for shortness of breath.  Work hard to wear your oxygen with all (most!) exertion.  Follow with Dr Delton Coombes in 4 months or sooner if you have any problems.  Sleep apnea in adult We will try using the nasal mask with your CPAP and get you a chin strap to eliminate mouth leakage.  Once you have been on CPAP reliably for a few months then we will perform a nighttime oximetry on the mask to see if you need oxygen added to the system.   Chronic respiratory failure (HCC) Discussed o2 compliance with her today - needs to wear more reliably with exertion.   Levy Pupa, MD, PhD 04/18/2017, 3:22 PM [facility] [phone] or if no answer [phone]

## 2017-04-18 NOTE — Assessment & Plan Note (Signed)
Discussed o2 compliance with her today - needs to wear more reliably with exertion.

## 2017-04-18 NOTE — Patient Instructions (Signed)
Please continue your Anoro once a day Take albuterol 2 puffs up to every 4 hours if needed for shortness of breath.  Work hard to wear your oxygen with all (most!) exertion.  We will try using the nasal mask with your CPAP and get you a chin strap to eliminate mouth leakage.  Once you have been on CPAP reliably for a few months then we will perform a nighttime oximetry on the mask to see if you need oxygen added to the system.  Follow with Dr Delton Coombes in 4 months or sooner if you have any problems.

## 2017-04-18 NOTE — Assessment & Plan Note (Signed)
Please continue your Anoro once a day Take albuterol 2 puffs up to every 4 hours if needed for shortness of breath.  Work hard to wear your oxygen with all (most!) exertion.  Follow with Dr Delton Coombes in 4 months or sooner if you have any problems.

## 2017-04-24 NOTE — Patient Instructions (Signed)
NEBULA PORTEN  04/24/2017     @PREFPERIOPPHARMACY @   Your procedure is scheduled on 05/02/2017.  Report to [facility] at 6:30 [facility].M.  Call this number if you have problems the morning of surgery:  [phone]   Remember:  Do not eat food or drink liquids after midnight.  Take these medicines the morning of surgery with A SIP OF WATER Albuterol inhaler and bring with you, Anoro Ellipta inhaler, Zyrtec, Flonase   Do not wear jewelry, make-up or nail polish.  Do not wear lotions, powders, or perfumes, or deoderant.  Do not shave 48 hours prior to surgery.  Men may shave face and neck.  Do not bring valuables to the hospital.  [facility] is not responsible for any belongings or valuables.  Contacts, dentures or bridgework may not be worn into surgery.  Leave your suitcase in the car.  After surgery it may be brought to your room.  For patients admitted to the hospital, discharge time will be determined by your treatment team.  Patients discharged the day of surgery will not be allowed to drive home.    Please read over the following fact sheets that you were given. Anesthesia Post-op Instructions     PATIENT INSTRUCTIONS POST-ANESTHESIA  IMMEDIATELY FOLLOWING SURGERY:  Do not drive or operate machinery for the first twenty four hours after surgery.  Do not make any important decisions for twenty four hours after surgery or while taking narcotic pain medications or sedatives.  If you develop intractable nausea and vomiting or a severe headache please notify your doctor immediately.  FOLLOW-UP:  Please make an appointment with your surgeon as instructed. You do not need to follow up with anesthesia unless specifically instructed to do so.  WOUND CARE INSTRUCTIONS (if applicable):  Keep a dry clean dressing on the anesthesia/puncture wound site if there is drainage.  Once the wound has quit draining you may leave it open to air.  Generally you should leave the bandage intact for  twenty four hours unless there is drainage.  If the epidural site drains for more than 36-48 hours please call the anesthesia department.  QUESTIONS?:  Please feel free to call your physician or the hospital operator if you have any questions, and they will be happy to assist you.      Cataract Surgery Cataract surgery is a procedure to remove a cataract from your eye. A cataract is cloudiness on the lens of your eye. The lens focuses light inside the eye. When a lens becomes cloudy, your vision is affected. Cataract surgery is a procedure to remove the cloudy lens. A substitute lens (intraocular lens or IOL) is usually inserted as a replacement for the cloudy lens. Tell a health care provider about:  Any allergies you have.  All medicines you are taking, including vitamins, herbs, eye drops, creams, and over-the-counter medicines.  Any problems you or family members have had with anesthetic medicines.  Any blood disorders you have.  Any surgeries you have had, especially eye surgeries that include refractive surgery, such as PRK and LASIK.  Any medical conditions you have.  Whether you are pregnant or may be pregnant. What are the risks? Generally, this is a safe procedure. However, problems may occur, including:  Infection.  Bleeding.  Glaucoma.  Retinal detachment.  Allergic reactions to medicines.  Damage to other structures or organs.  Inflammation of the eye.  Clouding of the part of your eye that holds an IOL in place (after-cataract),  if an IOL was inserted. This is fairly common.  An IOL moving out of position, if an IOL was inserted. This is very rare.  Loss of vision. This is rare. What happens before the procedure?  Follow instructions from your health care provider about eating or drinking restrictions.  Ask your health care provider about:  Changing or stopping your regular medicines, including any eye drops you have been prescribed. This is especially  important if you are taking diabetes medicines or blood thinners.  Taking medicines such as aspirin and ibuprofen. These medicines can thin your blood. Do not take these medicines before your procedure if your health care provider instructs you not to.  Do not put contact lenses in either eye on the day of your surgery.  Plan for someone to drive you to and from the procedure.  If you will be going home right after the procedure, plan to have someone with you for 24 hours. What happens during the procedure?  An IV tube may be inserted into one of your veins.  You will be given one or more of the following:  A medicine to help you relax (sedative).  A medicine to numb the area (local anesthetic). This may be numbing eye drops or an injection that is given behind the eye.  A small cut (incision) will be made to the edge of the clear, dome-shaped surface that covers the front of the eye (cornea).  A small probe will be inserted into the eye. This device gives off ultrasound waves that soften and break up the cloudy center of the lens. This makes it easier for the cloudy lens to be removed by suction.  An IOL may be implanted.  Part of the capsule that surrounds the lens will be left in the eye to support the IOL.  Your surgeon may use stitches (sutures) to close the incision. The procedure may vary among health care providers and hospitals. What happens after the procedure?  Your blood pressure, heart rate, breathing rate, and blood oxygen level will be monitored often until the medicines you were given have worn off.  You may be given a protective shield to wear over your eyes.  Do not drive for 24 hours if you received a sedative. This information is not intended to replace advice given to you by your health care provider. Make sure you discuss any questions you have with your health care provider. Document Released: 12/01/2011 Document Revised: 05/19/2016 Document Reviewed:  10/22/2015 Elsevier Interactive Patient Education  2017 [employer].

## 2017-04-27 ENCOUNTER — Encounter (HOSPITAL_COMMUNITY): Payer: Self-pay

## 2017-04-27 ENCOUNTER — Encounter (HOSPITAL_COMMUNITY)
Admission: RE | Admit: 2017-04-27 | Discharge: 2017-04-27 | Disposition: A | Discharge status: Home / Self Care | Payer: Medicaid Other | Source: Ambulatory Visit | Attending: Ophthalmology | Admitting: Ophthalmology

## 2017-04-27 DIAGNOSIS — H269 Unspecified cataract: Secondary | ICD-10-CM | POA: Diagnosis not present

## 2017-04-27 DIAGNOSIS — Z01812 Encounter for preprocedural laboratory examination: Secondary | ICD-10-CM | POA: Insufficient documentation

## 2017-04-27 HISTORY — DX: Acute myocardial infarction, unspecified: I21.9

## 2017-04-27 HISTORY — DX: Sleep apnea, unspecified: G47.30

## 2017-04-27 HISTORY — DX: Personal history of other diseases of the musculoskeletal system and connective tissue: Z87.39

## 2017-04-27 HISTORY — DX: Prediabetes: R73.03

## 2017-04-27 HISTORY — DX: Other disorders of lung: J98.4

## 2017-04-27 LAB — CBC WITH DIFFERENTIAL/PLATELET
BASOS PCT: 1 %
Basophils Absolute: 0 10*3/uL (ref 0.0–0.1)
EOS ABS: 0.2 10*3/uL (ref 0.0–0.7)
EOS PCT: 3 %
HCT: 43.7 % (ref 36.0–46.0)
HEMOGLOBIN: 14.4 g/dL (ref 12.0–15.0)
Lymphocytes Relative: 31 %
Lymphs Abs: 1.8 10*3/uL (ref 0.7–4.0)
MCH: 31.2 pg (ref 26.0–34.0)
MCHC: 33 g/dL (ref 30.0–36.0)
MCV: 94.6 fL (ref 78.0–100.0)
MONOS PCT: 7 %
Monocytes Absolute: 0.4 10*3/uL (ref 0.1–1.0)
NEUTROS PCT: 58 %
Neutro Abs: 3.4 10*3/uL (ref 1.7–7.7)
PLATELETS: 226 10*3/uL (ref 150–400)
RBC: 4.62 MIL/uL (ref 3.87–5.11)
RDW: 13 % (ref 11.5–15.5)
WBC: 5.8 10*3/uL (ref 4.0–10.5)

## 2017-04-27 LAB — BASIC METABOLIC PANEL
Anion gap: 8 (ref 5–15)
BUN: 15 mg/dL (ref 6–20)
CHLORIDE: 100 mmol/L — AB (ref 101–111)
CO2: 30 mmol/L (ref 22–32)
CREATININE: 0.83 mg/dL (ref 0.44–1.00)
Calcium: 9.5 mg/dL (ref 8.9–10.3)
Glucose, Bld: 110 mg/dL — ABNORMAL HIGH (ref 65–99)
Potassium: 3.6 mmol/L (ref 3.5–5.1)
SODIUM: 138 mmol/L (ref 135–145)

## 2017-05-02 ENCOUNTER — Ambulatory Visit (HOSPITAL_COMMUNITY): Payer: Medicaid Other | Admitting: Anesthesiology

## 2017-05-02 ENCOUNTER — Encounter (HOSPITAL_COMMUNITY)
Admission: RE | Disposition: A | Discharge status: Home / Self Care | Payer: Self-pay | Source: Ambulatory Visit | Attending: Ophthalmology

## 2017-05-02 ENCOUNTER — Ambulatory Visit (HOSPITAL_COMMUNITY)
Admission: RE | Admit: 2017-05-02 | Discharge: 2017-05-02 | Disposition: A | Discharge status: Home / Self Care | Payer: Medicaid Other | Source: Ambulatory Visit | Attending: Ophthalmology | Admitting: Ophthalmology

## 2017-05-02 ENCOUNTER — Encounter (HOSPITAL_COMMUNITY): Payer: Self-pay | Admitting: *Deleted

## 2017-05-02 DIAGNOSIS — F419 Anxiety disorder, unspecified: Secondary | ICD-10-CM | POA: Diagnosis not present

## 2017-05-02 DIAGNOSIS — Z79899 Other long term (current) drug therapy: Secondary | ICD-10-CM | POA: Insufficient documentation

## 2017-05-02 DIAGNOSIS — J449 Chronic obstructive pulmonary disease, unspecified: Secondary | ICD-10-CM | POA: Insufficient documentation

## 2017-05-02 DIAGNOSIS — H2511 Age-related nuclear cataract, right eye: Secondary | ICD-10-CM | POA: Diagnosis not present

## 2017-05-02 DIAGNOSIS — Z87891 Personal history of nicotine dependence: Secondary | ICD-10-CM | POA: Diagnosis not present

## 2017-05-02 DIAGNOSIS — I252 Old myocardial infarction: Secondary | ICD-10-CM | POA: Insufficient documentation

## 2017-05-02 DIAGNOSIS — K219 Gastro-esophageal reflux disease without esophagitis: Secondary | ICD-10-CM | POA: Insufficient documentation

## 2017-05-02 HISTORY — PX: CATARACT EXTRACTION W/PHACO: SHX586

## 2017-05-02 LAB — GLUCOSE, CAPILLARY: GLUCOSE-CAPILLARY: 94 mg/dL (ref 65–99)

## 2017-05-02 SURGERY — PHACOEMULSIFICATION, CATARACT, WITH IOL INSERTION
Anesthesia: Monitor Anesthesia Care | Site: Eye | Laterality: Right

## 2017-05-02 MED ORDER — MIDAZOLAM HCL 2 MG/2ML IJ SOLN
INTRAMUSCULAR | Status: AC
  Filled 2017-05-02: qty 2

## 2017-05-02 MED ORDER — LACTATED RINGERS IV SOLN
INTRAVENOUS | Status: DC
  Administered 2017-05-02: 10:00:00 via INTRAVENOUS

## 2017-05-02 MED ORDER — BSS IO SOLN
INTRAOCULAR | Status: DC | PRN
  Administered 2017-05-02: 15 mL

## 2017-05-02 MED ORDER — PROVISC 10 MG/ML IO SOLN
INTRAOCULAR | Status: DC | PRN
  Administered 2017-05-02: 0.85 mL via INTRAOCULAR

## 2017-05-02 MED ORDER — MIDAZOLAM HCL 2 MG/2ML IJ SOLN
1.0000 mg | INTRAMUSCULAR | Status: AC
  Administered 2017-05-02: 2 mg via INTRAVENOUS

## 2017-05-02 MED ORDER — FENTANYL CITRATE (PF) 100 MCG/2ML IJ SOLN
25.0000 ug | Freq: Once | INTRAMUSCULAR | Status: AC
  Administered 2017-05-02: 25 ug via INTRAVENOUS

## 2017-05-02 MED ORDER — TETRACAINE HCL 0.5 % OP SOLN
1.0000 [drp] | OPHTHALMIC | Status: AC
  Administered 2017-05-02 (×3): 1 [drp] via OPHTHALMIC

## 2017-05-02 MED ORDER — CYCLOPENTOLATE-PHENYLEPHRINE 0.2-1 % OP SOLN
1.0000 [drp] | OPHTHALMIC | Status: AC
  Administered 2017-05-02 (×3): 1 [drp] via OPHTHALMIC

## 2017-05-02 MED ORDER — PHENYLEPHRINE HCL 2.5 % OP SOLN
1.0000 [drp] | OPHTHALMIC | Status: AC
  Administered 2017-05-02 (×3): 1 [drp] via OPHTHALMIC

## 2017-05-02 MED ORDER — EPINEPHRINE PF 1 MG/ML IJ SOLN
INTRAOCULAR | Status: DC | PRN
  Administered 2017-05-02: 500 mL

## 2017-05-02 MED ORDER — KETOROLAC TROMETHAMINE 0.5 % OP SOLN
1.0000 [drp] | OPHTHALMIC | Status: AC
  Administered 2017-05-02 (×3): 1 [drp] via OPHTHALMIC

## 2017-05-02 MED ORDER — FENTANYL CITRATE (PF) 100 MCG/2ML IJ SOLN
INTRAMUSCULAR | Status: AC
  Filled 2017-05-02: qty 2

## 2017-05-02 SURGICAL SUPPLY — 10 items

## 2017-05-02 NOTE — Op Note (Signed)
Patient brought to the operating room and prepped and draped in the usual manner.  Lid speculum inserted in right eye.  Stab incision made at the twelve o'clock position.  Provisc instilled in the anterior chamber.   A 2.4 mm. Stab incision was made temporally.  An anterior capsulotomy was done with a bent 25 gauge needle.  The nucleus was hydrodissected.  The Phaco tip was inserted in the anterior chamber and the nucleus was emulsified.  CDE was 6.22.  The cortical material was then removed with the I and A tip.  Posterior capsule was the polished.  The anterior chamber was deepened with Provisc.  A 22.5 Diopter Alcon [id number] IOL was then inserted in the capsular bag.  Provisc was then removed with the I and A tip.  The wound was then hydrated.  Patient sent to the Recovery Room in good condition with follow up in my office.  Preoperative Diagnosis:  Nuclear Cataract OD Postoperative Diagnosis:  Same Procedure name: Kelman Phacoemulsification OD with IOL

## 2017-05-02 NOTE — Transfer of Care (Signed)
Immediate Anesthesia Transfer of Care Note  Patient: [name]  Procedure(s) Performed: Procedure(s) with comments: CATARACT EXTRACTION PHACO AND INTRAOCULAR LENS PLACEMENT (IOC) (Right) - CDE: 6.22  Patient Location: Short Stay  Anesthesia Type:MAC  Level of Consciousness: awake, alert , oriented and patient cooperative  Airway & Oxygen Therapy: Patient Spontanous Breathing  Post-op Assessment: Report given to RN and Post -op Vital signs reviewed and stable  Post vital signs: Reviewed and stable  Last Vitals:  Vitals:   05/02/17 0900  BP: 107/69  Pulse: 76  Resp: 18  Temp: 36.6 C    Last Pain:  Vitals:   05/02/17 0900  TempSrc: Oral      Patients Stated Pain Goal: 5 (49/20/10 0712)  Complications: No apparent anesthesia complications

## 2017-05-02 NOTE — H&P (Signed)
The patient was re examined and there is no change in the patients condition since the original H and P. 

## 2017-05-02 NOTE — Anesthesia Postprocedure Evaluation (Signed)
Anesthesia Post Note  Patient: Taylor Burnett  Procedure(s) Performed: Procedure(s) (LRB): CATARACT EXTRACTION PHACO AND INTRAOCULAR LENS PLACEMENT (IOC) (Right)  Patient location during evaluation: Short Stay Anesthesia Type: MAC Level of consciousness: awake and alert and oriented Pain management: pain level controlled Vital Signs Assessment: post-procedure vital signs reviewed and stable Respiratory status: spontaneous breathing Cardiovascular status: stable Postop Assessment: no signs of nausea or vomiting Anesthetic complications: no     Last Vitals:  Vitals:   05/02/17 0900  BP: 107/69  Pulse: 76  Resp: 18  Temp: 36.6 C    Last Pain:  Vitals:   05/02/17 0900  TempSrc: Oral                 ADAMS, AMY A

## 2017-05-02 NOTE — Anesthesia Procedure Notes (Signed)
Procedure Name: MAC Date/Time: 05/02/2017 9:56 AM Performed by: Andree Elk, AMY A Pre-anesthesia Checklist: Patient identified, Timeout performed, Emergency Drugs available, Suction available and Patient being monitored Oxygen Delivery Method: Nasal cannula

## 2017-05-02 NOTE — Anesthesia Preprocedure Evaluation (Signed)
Anesthesia Evaluation  Patient identified by MRN, date of birth, ID band Patient awake    Reviewed: Allergy & Precautions, NPO status , Patient's Chart, lab work & pertinent test results  Airway Mallampati: II   Neck ROM: Full    Dental  (+) Teeth Intact, Dental Advisory Given   Pulmonary asthma , COPD, former smoker,    breath sounds clear to auscultation       Cardiovascular + Past MI and + DOE  + dysrhythmias  Rhythm:Regular     Neuro/Psych Anxiety    GI/Hepatic Neg liver ROS, GERD  Medicated,  Endo/Other  negative endocrine ROS  Renal/GU negative Renal ROS     Musculoskeletal   Abdominal (+)  Abdomen: soft.    Peds  Hematology 14/43   Anesthesia Other Findings   Reproductive/Obstetrics                             Anesthesia Physical Anesthesia Plan  ASA: III  Anesthesia Plan: MAC   Post-op Pain Management:    Induction: Intravenous  Airway Management Planned: Nasal Cannula  Additional Equipment:   Intra-op Plan:   Post-operative Plan:   Informed Consent: I have reviewed the patients History and Physical, chart, labs and discussed the procedure including the risks, benefits and alternatives for the proposed anesthesia with the patient or authorized representative who has indicated his/her understanding and acceptance.     Plan Discussed with:   Anesthesia Plan Comments: (COPD, Multimodal pain RX)        Anesthesia Quick Evaluation

## 2017-05-02 NOTE — Discharge Instructions (Signed)
°  °        [facility] Instructions °[address]- [place] [address]-[place] °    ° °1. Avoid closing eyes tightly. One often closes the eye tightly when laughing, talking, sneezing, coughing or if they feel irritated. At these times, you should be careful not to close your eyes tightly. ° °2. Instill eye drops as instructed. To instill drops in your eye, open it, look up and have someone gently pull the lower lid down and instill a couple of drops inside the lower lid. ° °3. Do not touch upper lid. ° °4. Take Advil or Tylenol for pain. ° °5. You may use either eye for near work, such as reading or sewing and you may watch television. ° °6. You may have your hair done at the beauty parlor at any time. ° °7. Wear dark glasses with or without your own glasses if you are in bright light. ° °8. Call our office at [phone] or [phone] if you have sharp pain in your eye or unusual symptoms. ° °9.  FOLLOW UP WITH DR. SHAPIRO TODAY IN HIS [place] OFFICE AT 3:15pm. ° °  °I have received a copy of the above instructions and will follow them.  ° ° ° °IF YOU ARE IN IMMEDIATE DANGER CALL 911! ° °It is important for you to keep your follow-up appointment with your physician after discharge, OR, for you /your caregiver to make a follow-up appointment with your physician / medical provider after discharge. ° °Show these instructions to the next healthcare provider you see. ° ° °Moderate Conscious Sedation, Adult, Care After °These instructions provide you with information about caring for yourself after your procedure. Your health care provider may also give you more specific instructions. Your treatment has been planned according to current medical practices, but problems sometimes occur. Call your health care provider if you have any problems or questions after your procedure. °What can I expect after the procedure? °After your procedure, it is common: °· To feel sleepy for several  hours. °· To feel clumsy and have poor balance for several hours. °· To have poor judgment for several hours. °· To vomit if you eat too soon. °Follow these instructions at home: °For at least 24 hours after the procedure:  ° °· Do not: °¨ Participate in activities where you could fall or become injured. °¨ Drive. °¨ Use heavy machinery. °¨ Drink alcohol. °¨ Take sleeping pills or medicines that cause drowsiness. °¨ Make important decisions or sign legal documents. °¨ Take care of children on your own. °· Rest. °Eating and drinking  °· Follow the diet recommended by your health care provider. °· If you vomit: °¨ Drink water, juice, or soup when you can drink without vomiting. °¨ Make sure you have little or no nausea before eating solid foods. °General instructions  °· Have a responsible adult stay with you until you are awake and alert. °· Take over-the-counter and prescription medicines only as told by your health care provider. °· If you smoke, do not smoke without supervision. °· Keep all follow-up visits as told by your health care provider. This is important. °Contact a health care provider if: °· You keep feeling nauseous or you keep vomiting. °· You feel light-headed. °· You develop a rash. °· You have a fever. °Get help right away if: °· You have trouble breathing. °This information is not intended to replace advice given to you by your health care provider. Make   sure you discuss any questions you have with your health care provider. °Document Released: 10/02/2013 Document Revised: 05/16/2016 Document Reviewed: 04/02/2016 °Elsevier Interactive Patient Education © 2017 [employer] Inc. ° ° °

## 2017-05-03 ENCOUNTER — Encounter (HOSPITAL_COMMUNITY): Payer: Self-pay | Admitting: Ophthalmology

## 2017-05-11 ENCOUNTER — Encounter (HOSPITAL_COMMUNITY): Payer: Self-pay

## 2017-05-11 ENCOUNTER — Encounter (HOSPITAL_COMMUNITY)
Admission: RE | Admit: 2017-05-11 | Discharge: 2017-05-11 | Disposition: A | Discharge status: Home / Self Care | Payer: Medicaid Other | Source: Ambulatory Visit | Attending: Ophthalmology | Admitting: Ophthalmology

## 2017-05-11 NOTE — Patient Instructions (Signed)
Your procedure is scheduled on:05/16/2017                 Report to [facility] at 6:15    AM.  Call this number if you have problems the morning of surgery: [phone]   Remember:   Do not eat or drink :After Midnight.    Take these medicines the morning of surgery with A SIP OF WATER:    Zyrtec        Do not wear jewelry, make-up or nail polish.  Do not wear lotions, powders, or perfumes. You may wear deodorant.  Do not bring valuables to the hospital.  Contacts, dentures or bridgework may not be worn into surgery.  Patients discharged the day of surgery will not be allowed to drive home.  Name and phone number of your driver:    @[email or web address] @ Cataract Surgery  A cataract is a clouding of the lens of the eye. When a lens becomes cloudy, vision is reduced based on the degree and nature of the clouding. Surgery may be needed to improve vision. Surgery removes the cloudy lens and usually replaces it with a substitute lens (intraocular lens, IOL). LET YOUR EYE DOCTOR KNOW ABOUT:  Allergies to food or medicine.   Medicines taken including herbs, eyedrops, over-the-counter medicines, and creams.   Use of steroids (by mouth or creams).   Previous problems with anesthetics or numbing medicine.   History of bleeding problems or blood clots.   Previous surgery.   Other health problems, including diabetes and kidney problems.   Possibility of pregnancy, if this applies.  RISKS AND COMPLICATIONS  Infection.   Inflammation of the eyeball (endophthalmitis) that can spread to both eyes (sympathetic ophthalmia).   Poor wound healing.   If an IOL is inserted, it can later fall out of proper position. This is very uncommon.   Clouding of the part of your eye that holds an IOL in place. This is called an "after-cataract." These are uncommon, but easily treated.  BEFORE THE PROCEDURE  Do not eat or drink anything except small amounts of water for 8 to 12 before your surgery, or  as directed by your caregiver.   Unless you are told otherwise, continue any eyedrops you have been prescribed.   Talk to your primary caregiver about all other medicines that you take (both prescription and non-prescription). In some cases, you may need to stop or change medicines near the time of your surgery. This is most important if you are taking blood-thinning medicine.Do not stop medicines unless you are told to do so.   Arrange for someone to drive you to and from the procedure.   Do not put contact lenses in either eye on the day of your surgery.  PROCEDURE There is more than one method for safely removing a cataract. Your doctor can explain the differences and help determine which is best for you. Phacoemulsification surgery is the most common form of cataract surgery.  An injection is given behind the eye or eyedrops are given to make this a painless procedure.   A small cut (incision) is made on the edge of the clear, dome-shaped surface that covers the front of the eye (cornea).   A tiny probe is painlessly inserted into the eye. This device gives off ultrasound waves that soften and break up the cloudy center of the lens. This makes it easier for the cloudy lens to be removed by suction.   An IOL may be implanted.  The normal lens of the eye is covered by a clear capsule. Part of that capsule is intentionally left in the eye to support the IOL.   Your surgeon may or may not use stitches to close the incision.  There are other forms of cataract surgery that require a larger incision and stiches to close the eye. This approach is taken in cases where the doctor feels that the cataract cannot be easily removed using phacoemulsification. AFTER THE PROCEDURE  When an IOL is implanted, it does not need care. It becomes a permanent part of your eye and cannot be seen or felt.   Your doctor will schedule follow-up exams to check on your progress.   Review your other medicines  with your doctor to see which can be resumed after surgery.   Use eyedrops or take medicine as prescribed by your doctor.  Document Released: 12/01/2011 Document Reviewed: 11/28/2011 [facility] Patient Information 2012 [place].  .Cataract Surgery Care After Refer to this sheet in the next few weeks. These instructions provide you with information on caring for yourself after your procedure. Your caregiver may also give you more specific instructions. Your treatment has been planned according to current medical practices, but problems sometimes occur. Call your caregiver if you have any problems or questions after your procedure.  HOME CARE INSTRUCTIONS   Avoid strenuous activities as directed by your caregiver.   Ask your caregiver when you can resume driving.   Use eyedrops or other medicines to help healing and control pressure inside your eye as directed by your caregiver.   Only take over-the-counter or prescription medicines for pain, discomfort, or fever as directed by your caregiver.   Do not to touch or rub your eyes.   You may be instructed to use a protective shield during the first few days and nights after surgery. If not, wear sunglasses to protect your eyes. This is to protect the eye from pressure or from being accidentally bumped.   Keep the area around your eye clean and dry. Avoid swimming or allowing water to hit you directly in the face while showering. Keep soap and shampoo out of your eyes.   Do not bend or lift heavy objects. Bending increases pressure in the eye. You can walk, climb stairs, and do light household chores.   Do not put a contact lens into the eye that had surgery until your caregiver says it is okay to do so.   Ask your doctor when you can return to work. This will depend on the kind of work that you do. If you work in a dusty environment, you may be advised to wear protective eyewear for a period of time.   Ask your caregiver when it will  be safe to engage in sexual activity.   Continue with your regular eye exams as directed by your caregiver.  What to expect:  It is normal to feel itching and mild discomfort for a few days after cataract surgery. Some fluid discharge is also common, and your eye may be sensitive to light and touch.   After 1 to 2 days, even moderate discomfort should disappear. In most cases, healing will take about 6 weeks.   If you received an intraocular lens (IOL), you may notice that colors are very bright or have a blue tinge. Also, if you have been in bright sunlight, everything may appear reddish for a few hours. If you see these color tinges, it is because your lens is  clear and no longer cloudy. Within a few months after receiving an IOL, these extra colors should go away. When you have healed, you will probably need new glasses.  SEEK MEDICAL CARE IF:   You have increased bruising around your eye.   You have discomfort not helped by medicine.  SEEK IMMEDIATE MEDICAL CARE IF:   You have a fever.   You have a worsening or sudden vision loss.   You have redness, swelling, or increasing pain in the eye.   You have a thick discharge from the eye that had surgery.  MAKE SURE YOU:  Understand these instructions.   Will watch your condition.   Will get help right away if you are not doing well or get worse.  Document Released: 07/01/2005 Document Revised: 12/01/2011 Document Reviewed: 08/05/2011 [facility] Patient Information 2012 [place].    Monitored Anesthesia Care  Monitored anesthesia care is an anesthesia service for a medical procedure. Anesthesia is the loss of the ability to feel pain. It is produced by medications called anesthetics. It may affect a small area of your body (local anesthesia), a large area of your body (regional anesthesia), or your entire body (general anesthesia). The need for monitored anesthesia care depends your procedure, your condition, and the potential  need for regional or general anesthesia. It is often provided during procedures where:   General anesthesia may be needed if there are complications. This is because you need special care when you are under general anesthesia.   You will be under local or regional anesthesia. This is so that you are able to have higher levels of anesthesia if needed.   You will receive calming medications (sedatives). This is especially the case if sedatives are given to put you in a semi-conscious state of relaxation (deep sedation). This is because the amount of sedative needed to produce this state can be hard to predict. Too much of a sedative can produce general anesthesia. Monitored anesthesia care is performed by one or more caregivers who have special training in all types of anesthesia. You will need to meet with these caregivers before your procedure. During this meeting, they will ask you about your medical history. They will also give you instructions to follow. (For example, you will need to stop eating and drinking before your procedure. You may also need to stop or change medications you are taking.) During your procedure, your caregivers will stay with you. They will:   Watch your condition. This includes watching you blood pressure, breathing, and level of pain.   Diagnose and treat problems that occur.   Give medications if they are needed. These may include calming medications (sedatives) and anesthetics.   Make sure you are comfortable.  Having monitored anesthesia care does not necessarily mean that you will be under anesthesia. It does mean that your caregivers will be able to manage anesthesia if you need it or if it occurs. It also means that you will be able to have a different type of anesthesia than you are having if you need it. When your procedure is complete, your caregivers will continue to watch your condition. They will make sure any medications wear off before you are allowed  to go home.  Document Released: 09/07/2005 Document Revised: 04/08/2013 Document Reviewed: 01/23/2013 [facility] Patient Information 2014 [place], [place].

## 2017-05-15 MED ORDER — TETRACAINE HCL 0.5 % OP SOLN
OPHTHALMIC | Status: AC
  Filled 2017-05-15: qty 4

## 2017-05-15 MED ORDER — CYCLOPENTOLATE-PHENYLEPHRINE 0.2-1 % OP SOLN
OPHTHALMIC | Status: AC
  Filled 2017-05-15: qty 2

## 2017-05-15 MED ORDER — KETOROLAC TROMETHAMINE 0.5 % OP SOLN
OPHTHALMIC | Status: AC
  Filled 2017-05-15: qty 5

## 2017-05-15 MED ORDER — PHENYLEPHRINE HCL 2.5 % OP SOLN
OPHTHALMIC | Status: AC
  Filled 2017-05-15: qty 15

## 2017-05-16 ENCOUNTER — Ambulatory Visit (HOSPITAL_COMMUNITY)
Admission: RE | Admit: 2017-05-16 | Discharge: 2017-05-16 | Disposition: A | Discharge status: Home / Self Care | Payer: Medicaid Other | Source: Ambulatory Visit | Attending: Ophthalmology | Admitting: Ophthalmology

## 2017-05-16 ENCOUNTER — Encounter (HOSPITAL_COMMUNITY)
Admission: RE | Disposition: A | Discharge status: Home / Self Care | Payer: Self-pay | Source: Ambulatory Visit | Attending: Ophthalmology

## 2017-05-16 ENCOUNTER — Ambulatory Visit (HOSPITAL_COMMUNITY): Payer: Medicaid Other | Admitting: Anesthesiology

## 2017-05-16 ENCOUNTER — Encounter (HOSPITAL_COMMUNITY): Payer: Self-pay

## 2017-05-16 DIAGNOSIS — Z888 Allergy status to other drugs, medicaments and biological substances status: Secondary | ICD-10-CM | POA: Insufficient documentation

## 2017-05-16 DIAGNOSIS — I252 Old myocardial infarction: Secondary | ICD-10-CM | POA: Insufficient documentation

## 2017-05-16 DIAGNOSIS — Z87891 Personal history of nicotine dependence: Secondary | ICD-10-CM | POA: Diagnosis not present

## 2017-05-16 DIAGNOSIS — Z7951 Long term (current) use of inhaled steroids: Secondary | ICD-10-CM | POA: Diagnosis not present

## 2017-05-16 DIAGNOSIS — K219 Gastro-esophageal reflux disease without esophagitis: Secondary | ICD-10-CM | POA: Insufficient documentation

## 2017-05-16 DIAGNOSIS — R0609 Other forms of dyspnea: Secondary | ICD-10-CM | POA: Diagnosis not present

## 2017-05-16 DIAGNOSIS — H2512 Age-related nuclear cataract, left eye: Secondary | ICD-10-CM | POA: Diagnosis not present

## 2017-05-16 DIAGNOSIS — H25012 Cortical age-related cataract, left eye: Secondary | ICD-10-CM | POA: Diagnosis not present

## 2017-05-16 DIAGNOSIS — Z79899 Other long term (current) drug therapy: Secondary | ICD-10-CM | POA: Insufficient documentation

## 2017-05-16 DIAGNOSIS — J449 Chronic obstructive pulmonary disease, unspecified: Secondary | ICD-10-CM | POA: Diagnosis not present

## 2017-05-16 DIAGNOSIS — F419 Anxiety disorder, unspecified: Secondary | ICD-10-CM | POA: Diagnosis not present

## 2017-05-16 DIAGNOSIS — H269 Unspecified cataract: Secondary | ICD-10-CM | POA: Diagnosis present

## 2017-05-16 HISTORY — PX: CATARACT EXTRACTION W/PHACO: SHX586

## 2017-05-16 SURGERY — PHACOEMULSIFICATION, CATARACT, WITH IOL INSERTION
Anesthesia: Monitor Anesthesia Care | Site: Eye | Laterality: Left

## 2017-05-16 MED ORDER — PHENYLEPHRINE HCL 2.5 % OP SOLN
1.0000 [drp] | OPHTHALMIC | Status: AC
  Administered 2017-05-16 (×3): 1 [drp] via OPHTHALMIC

## 2017-05-16 MED ORDER — EPINEPHRINE PF 1 MG/ML IJ SOLN
INTRAOCULAR | Status: DC | PRN
  Administered 2017-05-16: 500 mL

## 2017-05-16 MED ORDER — EPINEPHRINE PF 1 MG/ML IJ SOLN
INTRAMUSCULAR | Status: AC
  Filled 2017-05-16: qty 9

## 2017-05-16 MED ORDER — FENTANYL CITRATE (PF) 100 MCG/2ML IJ SOLN
25.0000 ug | Freq: Once | INTRAMUSCULAR | Status: AC
  Administered 2017-05-16: 25 ug via INTRAVENOUS
  Filled 2017-05-16: qty 2

## 2017-05-16 MED ORDER — MIDAZOLAM HCL 2 MG/2ML IJ SOLN
1.0000 mg | INTRAMUSCULAR | Status: AC
  Administered 2017-05-16 (×2): 1 mg via INTRAVENOUS
  Filled 2017-05-16: qty 2

## 2017-05-16 MED ORDER — PROVISC 10 MG/ML IO SOLN
INTRAOCULAR | Status: DC | PRN
  Administered 2017-05-16: 0.85 mL via INTRAOCULAR

## 2017-05-16 MED ORDER — LIDOCAINE HCL (PF) 1 % IJ SOLN
INTRAMUSCULAR | Status: AC
  Filled 2017-05-16: qty 2

## 2017-05-16 MED ORDER — LACTATED RINGERS IV SOLN
INTRAVENOUS | Status: DC
  Administered 2017-05-16: 07:00:00 via INTRAVENOUS

## 2017-05-16 MED ORDER — LIDOCAINE HCL (PF) 1 % IJ SOLN
INTRAMUSCULAR | Status: DC | PRN
  Administered 2017-05-16: .5 mL

## 2017-05-16 MED ORDER — BSS IO SOLN
INTRAOCULAR | Status: DC | PRN
  Administered 2017-05-16: 15 mL via INTRAOCULAR

## 2017-05-16 MED ORDER — TETRACAINE HCL 0.5 % OP SOLN
1.0000 [drp] | OPHTHALMIC | Status: AC
  Administered 2017-05-16 (×3): 1 [drp] via OPHTHALMIC

## 2017-05-16 MED ORDER — CYCLOPENTOLATE-PHENYLEPHRINE 0.2-1 % OP SOLN
1.0000 [drp] | OPHTHALMIC | Status: AC
  Administered 2017-05-16 (×3): 1 [drp] via OPHTHALMIC

## 2017-05-16 MED ORDER — KETOROLAC TROMETHAMINE 0.5 % OP SOLN
1.0000 [drp] | OPHTHALMIC | Status: AC
  Administered 2017-05-16 (×3): 1 [drp] via OPHTHALMIC

## 2017-05-16 SURGICAL SUPPLY — 11 items
CLOTH BEACON ORANGE TIMEOUT ST (SAFETY) ×2 IMPLANT
EYE SHIELD UNIVERSAL CLEAR (GAUZE/BANDAGES/DRESSINGS) ×2 IMPLANT
GLOVE BIO SURGEON STRL SZ 6.5 (GLOVE) ×1 IMPLANT
GLOVE BIO SURGEONS STRL SZ 6.5 (GLOVE) ×1
GLOVE BIOGEL PI IND STRL 7.0 (GLOVE) IMPLANT
GLOVE BIOGEL PI INDICATOR 7.0 (GLOVE) ×2
HEALON 5 0.6 ML (INTRAOCULAR LENS) IMPLANT
LENS ALC ACRYL/TECN (Ophthalmic Related) ×3 IMPLANT
PAD ARMBOARD 7.5X6 YLW CONV (MISCELLANEOUS) ×2 IMPLANT
TAPE TRANSPARENT 1/2IN (GAUZE/BANDAGES/DRESSINGS) ×2 IMPLANT
WATER STERILE IRR 250ML POUR (IV SOLUTION) ×2 IMPLANT

## 2017-05-16 NOTE — Transfer of Care (Signed)
Immediate Anesthesia Transfer of Care Note  Patient: Taylor Burnett  Procedure(s) Performed: Procedure(s) with comments: CATARACT EXTRACTION PHACO AND INTRAOCULAR LENS PLACEMENT (IOC) (Left) - CDE: 4.10  Patient Location: Short Stay  Anesthesia Type:MAC  Level of Consciousness: awake  Airway & Oxygen Therapy: Patient Spontanous Breathing  Post-op Assessment: Report given to RN  Post vital signs: Reviewed  Last Vitals:  Vitals:   05/16/17 0705 05/16/17 0710  BP: 129/69 109/72  Pulse:    Resp: (!) 23 16  Temp:      Last Pain:  Vitals:   05/16/17 0644  TempSrc: Oral  PainSc: 0-No pain      Patients Stated Pain Goal: 5 (83/15/17 6160)  Complications: No apparent anesthesia complications

## 2017-05-16 NOTE — Discharge Instructions (Signed)
°  °          [facility] Instructions °[address]- [place] [address]-[place] °    ° °1. Avoid closing eyes tightly. One often closes the eye tightly when laughing, talking, sneezing, coughing or if they feel irritated. At these times, you should be careful not to close your eyes tightly. ° °2. Instill eye drops as instructed. To instill drops in your eye, open it, look up and have someone gently pull the lower lid down and instill a couple of drops inside the lower lid. ° °3. Do not touch upper lid. ° °4. Take Advil or Tylenol for pain. ° °5. You may use either eye for near work, such as reading or sewing and you may watch television. ° °6. You may have your hair done at the beauty parlor at any time. ° °7. Wear dark glasses with or without your own glasses if you are in bright light. ° °8. Call our office at [phone] or [phone] if you have sharp pain in your eye or unusual symptoms. ° °9.  FOLLOW UP WITH DR. SHAPIRO TODAY IN HIS [place] OFFICE AT 2:45pm. ° °  °I have received a copy of the above instructions and will follow them.  ° ° ° °IF YOU ARE IN IMMEDIATE DANGER CALL 911! ° °It is important for you to keep your follow-up appointment with your physician after discharge, OR, for you /your caregiver to make a follow-up appointment with your physician / medical provider after discharge. ° °Show these instructions to the next healthcare provider you see. ° °

## 2017-05-16 NOTE — Op Note (Signed)
Patient brought to the operating room and prepped and draped in the usual manner.  Lid speculum inserted in left eye.  Stab incision made at the twelve o'clock position. Intraocular Xylocaine instilled. Provisc instilled in the anterior chamber.   A 2.4 mm. Stab incision was made temporally.  An anterior capsulotomy was done with a bent 25 gauge needle.  The nucleus was hydrodissected.  The Phaco tip was inserted in the anterior chamber and the nucleus was emulsified.  CDE was 4.10.  The cortical material was then removed with the I and A tip.  Posterior capsule was the polished.  The anterior chamber was deepened with Provisc.  A 22.5 Diopter Alcon [id number] IOL was then inserted in the capsular bag.  Provisc was then removed with the I and A tip.  The wound was then hydrated.  Patient sent to the Recovery Room in good condition with follow up in my office.  Preoperative Diagnosis: Cortical and  Nuclear Cataract OS Postoperative Diagnosis:  Same Procedure name: [place] Phacoemulsification OS with IOL

## 2017-05-16 NOTE — Anesthesia Preprocedure Evaluation (Signed)
Anesthesia Evaluation  Patient identified by MRN, date of birth, ID band Patient awake    Reviewed: Allergy & Precautions, NPO status , Patient's Chart, lab work & pertinent test results  Airway Mallampati: II   Neck ROM: Full    Dental  (+) Teeth Intact, Dental Advisory Given   Pulmonary asthma , COPD, former smoker,    breath sounds clear to auscultation       Cardiovascular + Past MI and + DOE  + dysrhythmias  Rhythm:Regular     Neuro/Psych Anxiety    GI/Hepatic Neg liver ROS, GERD  Medicated,  Endo/Other  negative endocrine ROS  Renal/GU negative Renal ROS     Musculoskeletal   Abdominal (+)  Abdomen: soft.    Peds  Hematology 14/43   Anesthesia Other Findings   Reproductive/Obstetrics                             Anesthesia Physical Anesthesia Plan  ASA: III  Anesthesia Plan: MAC   Post-op Pain Management:    Induction: Intravenous  Airway Management Planned: Nasal Cannula  Additional Equipment:   Intra-op Plan:   Post-operative Plan:   Informed Consent: I have reviewed the patients History and Physical, chart, labs and discussed the procedure including the risks, benefits and alternatives for the proposed anesthesia with the patient or authorized representative who has indicated his/her understanding and acceptance.     Plan Discussed with:   Anesthesia Plan Comments: (COPD, Multimodal pain RX)        Anesthesia Quick Evaluation  

## 2017-05-16 NOTE — Anesthesia Postprocedure Evaluation (Signed)
Anesthesia Post Note  Patient: Taylor Burnett  Procedure(s) Performed: Procedure(s) (LRB): CATARACT EXTRACTION PHACO AND INTRAOCULAR LENS PLACEMENT (IOC) (Left)  Patient location during evaluation: Short Stay Anesthesia Type: MAC Level of consciousness: awake and alert and oriented Pain management: pain level controlled Vital Signs Assessment: post-procedure vital signs reviewed and stable Respiratory status: spontaneous breathing Cardiovascular status: blood pressure returned to baseline Postop Assessment: no signs of nausea or vomiting Anesthetic complications: no     Last Vitals:  Vitals:   05/16/17 0705 05/16/17 0710  BP: 129/69 109/72  Pulse:    Resp: (!) 23 16  Temp:      Last Pain:  Vitals:   05/16/17 0644  TempSrc: Oral  PainSc: 0-No pain                 Ellianah Cordy

## 2017-05-16 NOTE — H&P (Signed)
The patient was re examined and there is no change in the patients condition since the original H and P. 

## 2017-05-17 ENCOUNTER — Encounter (HOSPITAL_COMMUNITY): Payer: Self-pay | Admitting: Ophthalmology

## 2017-05-17 ENCOUNTER — Ambulatory Visit: Payer: Medicaid Other | Admitting: Emergency Medicine

## 2017-06-12 ENCOUNTER — Telehealth: Payer: Self-pay

## 2017-06-12 ENCOUNTER — Encounter: Payer: Self-pay | Admitting: Emergency Medicine

## 2017-06-12 NOTE — Telephone Encounter (Signed)
Spoke with Selena with [facility], and started PA for Anoro. Selena states we should receive determination within 24 to 72 hours via fax. Will route to [place] to f/u on.  PA [phone] [place] [facility] phone # [phone]

## 2017-06-16 NOTE — Telephone Encounter (Signed)
Anoro has been approved through 06/07/2018 --- Interaction ID# [id number]  Spoke with Carmie Kanner at [facility], aware of this approval.   Pt contacted via MyChart in response to an e-mail sent this morning.   Nothing further needed.

## 2017-06-19 ENCOUNTER — Encounter: Payer: Self-pay | Admitting: Family Medicine

## 2017-06-19 ENCOUNTER — Ambulatory Visit (INDEPENDENT_AMBULATORY_CARE_PROVIDER_SITE_OTHER): Payer: Medicaid Other | Admitting: Family Medicine

## 2017-06-19 VITALS — BP 116/79 | HR 74 | Temp 97.9°F | Resp 16 | Ht 62.0 in | Wt 138.0 lb

## 2017-06-19 DIAGNOSIS — G4700 Insomnia, unspecified: Secondary | ICD-10-CM | POA: Diagnosis not present

## 2017-06-19 DIAGNOSIS — F419 Anxiety disorder, unspecified: Secondary | ICD-10-CM | POA: Diagnosis not present

## 2017-06-19 DIAGNOSIS — R5383 Other fatigue: Secondary | ICD-10-CM | POA: Diagnosis not present

## 2017-06-19 DIAGNOSIS — R7303 Prediabetes: Secondary | ICD-10-CM | POA: Diagnosis not present

## 2017-06-19 LAB — BASIC METABOLIC PANEL
BUN: 11 mg/dL (ref 7–25)
CHLORIDE: 103 mmol/L (ref 98–110)
CO2: 28 mmol/L (ref 20–31)
CREATININE: 0.82 mg/dL (ref 0.50–0.99)
Calcium: 9.7 mg/dL (ref 8.6–10.4)
Glucose, Bld: 84 mg/dL (ref 65–99)
POTASSIUM: 4.3 mmol/L (ref 3.5–5.3)
SODIUM: 141 mmol/L (ref 135–146)

## 2017-06-19 LAB — CBC WITH DIFFERENTIAL/PLATELET
BASOS PCT: 1 %
Basophils Absolute: 46 cells/uL (ref 0–200)
EOS ABS: 184 {cells}/uL (ref 15–500)
EOS PCT: 4 %
HCT: 45.4 % — ABNORMAL HIGH (ref 35.0–45.0)
Hemoglobin: 14.9 g/dL (ref 11.7–15.5)
LYMPHS ABS: 1518 {cells}/uL (ref 850–3900)
Lymphocytes Relative: 33 %
MCH: 30.8 pg (ref 27.0–33.0)
MCHC: 32.8 g/dL (ref 32.0–36.0)
MCV: 93.8 fL (ref 80.0–100.0)
MONOS PCT: 8 %
MPV: 8.9 fL (ref 7.5–12.5)
Monocytes Absolute: 368 cells/uL (ref 200–950)
NEUTROS ABS: 2484 {cells}/uL (ref 1500–7800)
Neutrophils Relative %: 54 %
PLATELETS: 245 10*3/uL (ref 140–400)
RBC: 4.84 MIL/uL (ref 3.80–5.10)
RDW: 13.5 % (ref 11.0–15.0)
WBC: 4.6 10*3/uL (ref 3.8–10.8)

## 2017-06-19 LAB — POCT URINALYSIS DIP (DEVICE)
BILIRUBIN URINE: NEGATIVE
Glucose, UA: NEGATIVE mg/dL
Hgb urine dipstick: NEGATIVE
KETONES UR: NEGATIVE mg/dL
Leukocytes, UA: NEGATIVE
Nitrite: NEGATIVE
PH: 5.5 (ref 5.0–8.0)
PROTEIN: NEGATIVE mg/dL
SPECIFIC GRAVITY, URINE: 1.01 (ref 1.005–1.030)
Urobilinogen, UA: 0.2 mg/dL (ref 0.0–1.0)

## 2017-06-19 LAB — POCT GLYCOSYLATED HEMOGLOBIN (HGB A1C): Hemoglobin A1C: 5.2

## 2017-06-19 MED ORDER — HYDROXYZINE HCL 10 MG PO TABS: 10.0000 mg | ORAL_TABLET | Freq: Three times a day (TID) | ORAL | 2 refills | Status: DC | PRN

## 2017-06-19 NOTE — Progress Notes (Signed)
Taylor Burnett, a 66 year old female presents complaining of insomnia and anxiety. She has been trivalved on varying medications for insomnia with minimal relief. She says that she typically gets 3-4 hours of interrupted sleep per night.   She is also complaining of increased anxiety. Her daughter was recently diagnosed with multiple sclerosis and her brother recently moved in with her. She endorses being unable to rest and uncontrolled worrying. She denies suicidal or homicidal intent.    Insomnia  Primary symptoms: fragmented sleep, sleep disturbance, difficulty falling asleep, no somnolence, frequent awakening, malaise/fatigue.  The current episode started more than one year. The onset quality is undetermined. The symptoms are aggravated by anxiety, emotional upset and family issues. How many beverages per day that contain caffeine: 0 - 1.  Types of beverages you drink: coffee. Past treatments include medication. Typical bedtime:  10-11 P.M..  PMH includes: family stress or anxiety. Prior diagnostic workup includes:  Polysomnogram.  Anxiety  Presents for follow-up visit. Symptoms include decreased concentration, depressed mood, excessive worry, insomnia, nervous/anxious behavior and shortness of breath. Patient reports no chest pain or suicidal ideas.     Past Medical History:  Diagnosis Date  . Arthritis   . COPD (chronic obstructive pulmonary disease) (HCC)   . Cough   . DDD (degenerative disc disease), cervical   . Dysrhythmia    RAPID HEART BEAT OCC   . Emphysema of lung (HCC)   . GERD (gastroesophageal reflux disease)    OCC  . H/O degenerative disc disease   . Headache   . Myocardial infarction (HCC) 2016  . Osteoporosis   . Panic attacks   . Pre-diabetes   . Severe lung disease   . Shortness of breath dyspnea   . Sleep apnea   . Spondylosis    Social History   Social History  . Marital status: Widowed    Spouse name: N/A  . Number of children: N/A  . Years of  education: N/A   Occupational History  . on disability.    Social History Main Topics  . Smoking status: Former Smoker    Packs/day: 2.00    Years: 40.00    Types: Cigarettes    Quit date: 07/31/2013  . Smokeless tobacco: Never Used  . Alcohol use No     Comment: OCC WINE   . Drug use: No  . Sexual activity: Not on file   Other Topics Concern  . Not on file   Social History Narrative   Divorced and lives alone.   Review of Systems  Constitutional: Positive for malaise/fatigue.  HENT: Negative.   Eyes: Negative.   Respiratory: Positive for shortness of breath.   Cardiovascular: Negative.  Negative for chest pain.  Gastrointestinal: Negative.   Musculoskeletal: Negative.   Skin: Negative.   Neurological: Negative.   Endo/Heme/Allergies: Negative.   Psychiatric/Behavioral: Positive for decreased concentration and sleep disturbance. Negative for suicidal ideas. The patient is nervous/anxious and has insomnia.    Physical Exam  HENT:  Head: Normocephalic. Head is with raccoon's eyes.  Right Ear: External ear normal.  Left Ear: External ear normal.  Mouth/Throat: Oropharynx is clear and moist.  Eyes: Conjunctivae are normal. Pupils are equal, round, and reactive to light.  Neck: Normal range of motion. Neck supple.  Pulmonary/Chest: Effort normal and breath sounds normal.  Abdominal: Soft. Bowel sounds are normal.  Psychiatric: Memory normal. Her mood appears anxious. She expresses no homicidal and no suicidal ideation. She has a flat affect.  BP 116/79 (BP Location: Left Arm, Patient Position: Sitting, Cuff Size: Normal)   Pulse 74   Temp 97.9 F (36.6 C) (Oral)   Resp 16   Ht 5\' 2"  (1.575 m)   Wt 138 lb (62.6 kg)   SpO2 96%   BMI 25.24 kg/m  1. Insomnia, unspecified type [name] and I discussed sleep hygiene at length. I suspect that insomnia is secondary to anxiety.  Practice good sleep hygiene.  Stick to a sleep schedule, even on weekends. Exercise is  great, but not too late in the day Avoid alcoholic drinks before bed Avoid large meals and beverages late before bed Don't take naps after 3 pm. Keep power naps less than 1 hour.  Relax before bed.  Take a hot bath before bed.  Have a good sleeping environment. Get rid of anything in your bedroom that might distract you from sleep.  Adopt good sleeping posture.   2. Anxiety [name] refuses to start an anti depressant for anxiety. She has taking antidepressants in the past with unsatisfactory results. Will start a trial of Vistaril 10 mg 3 times per day as needed.  - hydrOXYzine (ATARAX/VISTARIL) 10 MG tablet; Take 1 tablet (10 mg total) by mouth 3 (three) times daily as needed for anxiety.  Dispense: 30 tablet; Refill: 2  3. Prediabetes Hemoglobin a1C has improved. Recommend a lowfat, low carbohydrate diet divided over 5-6 small meals, increase water intake to 6-8 glasses, and 150 minutes per week of cardiovascular exercise.    - HgB A1c - Basic Metabolic Panel  4. Other fatigue - CBC with Differential/Platelet - Thyroid Panel With TSH   RTC: 3 months for pap smear  Nolon Nations  MSN, FNP-C [facility] [address] [place], [place] [address] [phone]

## 2017-06-19 NOTE — Patient Instructions (Addendum)
Insomnia: Practice good sleep hygiene.  Stick to a sleep schedule, even on weekends. Exercise is great, but not too late in the day Avoid alcoholic drinks before bed Avoid large meals and beverages late before bed Don't take naps after 3 pm. Keep power naps less than 1 hour.  Relax before bed.  Take a hot bath before bed.  Have a good sleeping environment. Get rid of anything in your bedroom that might distract you from sleep.  Adopt good sleeping posture.   Anxiety:  Will start a trial of Atarax 10 mg three times per day as needed for increased anxiety.   Prediabetes:  Hemoglobin a1C has decreased to 5.2.  Recommend a lowfat, low carbohydrate diet divided over 5-6 small meals, increase water intake to 6-8 glasses, and 150 minutes per week of cardiovascular exercise.   Fatigue:  Will follow up by phone with laboratory results    Living With Anxiety After being diagnosed with an anxiety disorder, you may be relieved to know why you have felt or behaved a certain way. It is natural to also feel overwhelmed about the treatment ahead and what it will mean for your life. With care and support, you can manage this condition and recover from it. How to cope with anxiety Dealing with stress Stress is your body's reaction to life changes and events, both good and bad. Stress can last just a few hours or it can be ongoing. Stress can play a major role in anxiety, so it is important to learn both how to cope with stress and how to think about it differently. Talk with your health care provider or a counselor to learn more about stress reduction. He or she may suggest some stress reduction techniques, such as:  Music therapy. This can include creating or listening to music that you enjoy and that inspires you.  Mindfulness-based meditation. This involves being aware of your normal breaths, rather than trying to control your breathing. It can be done while sitting or walking.  Centering prayer.  This is a kind of meditation that involves focusing on a word, phrase, or sacred image that is meaningful to you and that brings you peace.  Deep breathing. To do this, expand your stomach and inhale slowly through your nose. Hold your breath for 3-5 seconds. Then exhale slowly, allowing your stomach muscles to relax.  Self-talk. This is a skill where you identify thought patterns that lead to anxiety reactions and correct those thoughts.  Muscle relaxation. This involves tensing muscles then relaxing them.  Choose a stress reduction technique that fits your lifestyle and personality. Stress reduction techniques take time and practice. Set aside 5-15 minutes a day to do them. Therapists can offer training in these techniques. The training may be covered by some insurance plans. Other things you can do to manage stress include:  Keeping a stress diary. This can help you learn what triggers your stress and ways to control your response.  Thinking about how you respond to certain situations. You may not be able to control everything, but you can control your reaction.  Making time for activities that help you relax, and not feeling guilty about spending your time in this way.  Therapy combined with coping and stress-reduction skills provides the best chance for successful treatment. Medicines Medicines can help ease symptoms. Medicines for anxiety include:  Anti-anxiety drugs.  Antidepressants.  Beta-blockers.  Medicines may be used as the main treatment for anxiety disorder, along with therapy, or if  other treatments are not working. Medicines should be prescribed by a health care provider. Relationships Relationships can play a big part in helping you recover. Try to spend more time connecting with trusted friends and family members. Consider going to couples counseling, taking family education classes, or going to family therapy. Therapy can help you and others better understand the  condition. How to recognize changes in your condition Everyone has a different response to treatment for anxiety. Recovery from anxiety happens when symptoms decrease and stop interfering with your daily activities at home or work. This may mean that you will start to:  Have better concentration and focus.  Sleep better.  Be less irritable.  Have more energy.  Have improved memory.  It is important to recognize when your condition is getting worse. Contact your health care provider if your symptoms interfere with home or work and you do not feel like your condition is improving. Where to find help and support: You can get help and support from these sources:  Self-help groups.  Online and [employer].  A trusted spiritual leader.  Couples counseling.  Family education classes.  Family therapy.  Follow these instructions at home:  Eat a healthy diet that includes plenty of vegetables, fruits, whole grains, low-fat dairy products, and lean protein. Do not eat a lot of foods that are high in solid fats, added sugars, or salt.  Exercise. Most adults should do the following: ? Exercise for at least 150 minutes each week. The exercise should increase your heart rate and make you sweat (moderate-intensity exercise). ? Strengthening exercises at least twice a week.  Cut down on caffeine, tobacco, alcohol, and other potentially harmful substances.  Get the right amount and quality of sleep. Most adults need 7-9 hours of sleep each night.  Make choices that simplify your life.  Take over-the-counter and prescription medicines only as told by your health care provider.  Avoid caffeine, alcohol, and certain over-the-counter cold medicines. These may make you feel worse. Ask your pharmacist which medicines to avoid.  Keep all follow-up visits as told by your health care provider. This is important. Questions to ask your health care provider  Would I benefit from  therapy?  How often should I follow up with a health care provider?  How long do I need to take medicine?  Are there any long-term side effects of my medicine?  Are there any alternatives to taking medicine? Contact a health care provider if:  You have a hard time staying focused or finishing daily tasks.  You spend many hours a day feeling worried about everyday life.  You become exhausted by worry.  You start to have headaches, feel tense, or have nausea.  You urinate more than normal.  You have diarrhea. Get help right away if:  You have a racing heart and shortness of breath.  You have thoughts of hurting yourself or others. If you ever feel like you may hurt yourself or others, or have thoughts about taking your own life, get help right away. You can go to your nearest emergency department or call:  Your local emergency services (911 in the [place]  A suicide crisis helpline, such as the National Suicide Prevention Lifeline at 973-853-5946. This is open 24-hours a day.  Summary  Taking steps to deal with stress can help calm you.  Medicines cannot cure anxiety disorders, but they can help ease symptoms.  Family, friends, and partners can play a big part in helping you  recover from an anxiety disorder. This information is not intended to replace advice given to you by your health care provider. Make sure you discuss any questions you have with your health care provider. Document Released: 12/06/2016 Document Revised: 12/06/2016 Document Reviewed: 12/06/2016 Elsevier Interactive Patient Education  2018 [employer]. Hydroxyzine capsules or tablets What is this medicine? HYDROXYZINE (hye DROX i zeen) is an antihistamine. This medicine is used to treat allergy symptoms. It is also used to treat anxiety and tension. This medicine can be used with other medicines to induce sleep before surgery. This medicine may be used for other purposes; ask your health care provider  or pharmacist if you have questions. COMMON BRAND NAME(S): ANX, Atarax, Rezine, Vistaril What should I tell my health care provider before I take this medicine? They need to know if you have any of these conditions: -any chronic illness -difficulty passing urine -glaucoma -heart disease -kidney disease -liver disease -lung disease -an unusual or allergic reaction to hydroxyzine, cetirizine, other medicines, foods, dyes, or preservatives -pregnant or trying to get pregnant -breast-feeding How should I use this medicine? Take this medicine by mouth with a full glass of water. Follow the directions on the prescription label. You may take this medicine with food or on an empty stomach. Take your medicine at regular intervals. Do not take your medicine more often than directed. Talk to your pediatrician regarding the use of this medicine in children. Special care may be needed. While this drug may be prescribed for children as young as 8 years of age for selected conditions, precautions do apply. Patients over 58 years old may have a stronger reaction and need a smaller dose. Overdosage: If you think you have taken too much of this medicine contact a poison control center or emergency room at once. NOTE: This medicine is only for you. Do not share this medicine with others. What if I miss a dose? If you miss a dose, take it as soon as you can. If it is almost time for your next dose, take only that dose. Do not take double or extra doses. What may interact with this medicine? -alcohol -barbiturate medicines for sleep or seizures -medicines for colds, allergies -medicines for depression, anxiety, or emotional disturbances -medicines for pain -medicines for sleep -muscle relaxants This list may not describe all possible interactions. Give your health care provider a list of all the medicines, herbs, non-prescription drugs, or dietary supplements you use. Also tell them if you smoke, drink  alcohol, or use illegal drugs. Some items may interact with your medicine. What should I watch for while using this medicine? Tell your doctor or health care professional if your symptoms do not improve. You may get drowsy or dizzy. Do not drive, use machinery, or do anything that needs mental alertness until you know how this medicine affects you. Do not stand or sit up quickly, especially if you are an older patient. This reduces the risk of dizzy or fainting spells. Alcohol may interfere with the effect of this medicine. Avoid alcoholic drinks. Your mouth may get dry. Chewing sugarless gum or sucking hard candy, and drinking plenty of water may help. Contact your doctor if the problem does not go away or is severe. This medicine may cause dry eyes and blurred vision. If you wear contact lenses you may feel some discomfort. Lubricating drops may help. See your eye doctor if the problem does not go away or is severe. If you are receiving skin tests for  allergies, tell your doctor you are using this medicine. What side effects may I notice from receiving this medicine? Side effects that you should report to your doctor or health care professional as soon as possible: -fast or irregular heartbeat -difficulty passing urine -seizures -slurred speech or confusion -tremor Side effects that usually do not require medical attention (report to your doctor or health care professional if they continue or are bothersome): -constipation -drowsiness -fatigue -headache -stomach upset This list may not describe all possible side effects. Call your doctor for medical advice about side effects. You may report side effects to FDA at 1-800-FDA-1088. Where should I keep my medicine? Keep out of the reach of children. Store at room temperature between 15 and 30 degrees C (59 and 86 degrees F). Keep container tightly closed. Throw away any unused medicine after the expiration date. NOTE: This sheet is a summary.  It may not cover all possible information. If you have questions about this medicine, talk to your doctor, pharmacist, or health care provider.  2018 Elsevier/Gold Standard (2008-04-25 14:50:59)

## 2017-06-20 LAB — THYROID PANEL WITH TSH
FREE THYROXINE INDEX: 2.9 (ref 1.4–3.8)
T3 Uptake: 28 % (ref 22–35)
T4 TOTAL: 10.2 ug/dL (ref 4.5–12.0)
TSH: 1.34 m[IU]/L

## 2017-06-21 DIAGNOSIS — F419 Anxiety disorder, unspecified: Secondary | ICD-10-CM | POA: Insufficient documentation

## 2017-06-21 DIAGNOSIS — R7303 Prediabetes: Secondary | ICD-10-CM | POA: Insufficient documentation

## 2017-06-27 ENCOUNTER — Other Ambulatory Visit: Payer: Self-pay | Admitting: Emergency Medicine

## 2017-07-24 ENCOUNTER — Ambulatory Visit: Payer: Medicaid Other | Admitting: Family Medicine

## 2017-07-31 ENCOUNTER — Encounter: Payer: Self-pay | Admitting: Emergency Medicine

## 2017-08-02 ENCOUNTER — Ambulatory Visit (INDEPENDENT_AMBULATORY_CARE_PROVIDER_SITE_OTHER): Payer: Medicare Other | Admitting: Emergency Medicine

## 2017-08-02 DIAGNOSIS — J9611 Chronic respiratory failure with hypoxia: Secondary | ICD-10-CM

## 2017-08-02 DIAGNOSIS — J438 Other emphysema: Secondary | ICD-10-CM | POA: Diagnosis not present

## 2017-08-03 ENCOUNTER — Telehealth: Payer: Self-pay | Admitting: Emergency Medicine

## 2017-08-03 DIAGNOSIS — J438 Other emphysema: Secondary | ICD-10-CM

## 2017-08-03 NOTE — Telephone Encounter (Signed)
lmomtcb x 1 for Taylor Burnett with [facility] 

## 2017-08-04 NOTE — Telephone Encounter (Signed)
Called and spoke with Taylor Burnett and he stated that this would be a lengthy process since this is a transition pt.  Pt would need a walk in the office, ONO is scheduled and the OV note will need to be documented as if the pt is a new start to oxygen due to medicare.  Taylor Burnett stated that since the pt already is set up with [facility] this process may be easier with them.   I called and spoke with the pt and she stated that she is fine to have the order sent to [facility] but they did take her cpap due to she was not able to meet the compliance with this due to her insomnia.    Will forward to RB to make him aware. New order has been sent to [facility].

## 2017-08-04 NOTE — Telephone Encounter (Signed)
lmtcb X2 for [place] at [facility].

## 2017-08-07 ENCOUNTER — Telehealth: Payer: Self-pay | Admitting: Emergency Medicine

## 2017-08-07 DIAGNOSIS — J438 Other emphysema: Secondary | ICD-10-CM

## 2017-08-07 NOTE — Telephone Encounter (Signed)
Will leave message in triage to f/u on 8/14, as it is currently after hours for [facility].

## 2017-08-08 NOTE — Telephone Encounter (Signed)
Called [facility] to speak with Marchelle Folks. She will not be in the office until after noon. Will need to call back then to speak with her.

## 2017-08-10 NOTE — Telephone Encounter (Signed)
Called and spoke to [place] with [facility]. She states the order for pt's O2 states to only provide pt with POC, however, insurance will not only pay for a POC to be used with exertion. The order needs to states "continuous with stationary and POC". New order placed. Marchelle Folks also states insurance is needing the OV note to be signed.   Dr. Delton Coombes please advise if pt's OV note and new order can be signed. Thanks.

## 2017-08-14 ENCOUNTER — Encounter: Payer: Self-pay | Admitting: Emergency Medicine

## 2017-08-15 NOTE — Telephone Encounter (Signed)
Yes Ok to change order to read this way

## 2017-08-15 NOTE — Telephone Encounter (Signed)
RB please advise. Thanks.  

## 2017-08-21 ENCOUNTER — Ambulatory Visit: Payer: Medicaid Other | Admitting: Emergency Medicine

## 2017-08-23 ENCOUNTER — Telehealth: Payer: Self-pay | Admitting: Emergency Medicine

## 2017-08-23 ENCOUNTER — Encounter: Payer: Self-pay | Admitting: Emergency Medicine

## 2017-08-23 ENCOUNTER — Ambulatory Visit: Payer: Medicaid Other | Admitting: Emergency Medicine

## 2017-08-23 NOTE — Assessment & Plan Note (Signed)
Chronic hypoxemic respiratory failure. She needs a new oxygen company. We documented hypoxemia at rest on room air. We will perform a walking oximetry and document was exertional desaturation and also confirm appropriate dosing to keep her greater than 90%. Given her history of obstructive sleep apnea we will also perform an overnight oximetry on room air and titrate overnight oxygen appropriately.

## 2017-08-23 NOTE — Assessment & Plan Note (Signed)
We will continue her current Anoro

## 2017-08-23 NOTE — Telephone Encounter (Signed)
From: Anette Riedel. Mazzola    Sent: 08/23/2017 10:57 AM EDT      To: Leslye Peer., MD Subject: Visit Follow-Up Question  I'm having an episode with bad coughing and mucous is yellow. Very short of breath. This has gone on for about 3 days. They will be picking my oxygen up on Friday as I can no longer keep it without paying rent for it. I called [facility] and they said they were waiting on drs. notes. So I'm going to be without the oxygen until they decide to deliver it.  I'm sorry to bother you with this but I don't understand why it's taking them so long.    RB, please advise. Thanks!

## 2017-08-23 NOTE — Telephone Encounter (Signed)
I just completed the note in Epic from her office visit on the day when the computer system was down. This apparently needs to be forwarded to [facility]. Please make sure that this as well as documentation of her saturations her walking oximetry go to [facility] soon as possible thank you

## 2017-08-23 NOTE — Progress Notes (Signed)
Subjective:    Patient ID: Taylor Burnett, female    DOB: 1951-01-09, 66 y.o.   MRN: [id number]  HPI 66 yo woman, former smoker, has been followed by Dr Shelle Iron for COPD, Allergic rhinitis, cough, mild obstructive sleep apnea    ROV 11/14/16 -- follow up visit for COPD and allergic rhinitis. Mild OSA based on her PSG from 07/2016. At our last visit she described progressive exertional SOB, occasional wheeze. She has been managed on Anoro. We repeat her PFT today that I have reviewed > shows very severe obstruction without BD response, severe hyperinflation, decreased diffusion capacity.  She was treated with azithro earlier this month - now feels back to baseline. She does not have home O2. She uses albuterol rarely due to agitation and side effects. On flonase + zyrtec. Still some mucous but better than last time. She is to see Dr Allyson Sabal on 11/29.   ROV 04/18/17 -- patient has a history of COPD (very severe obstruction), allergic rhinitis, obstructive sleep apnea. We performed a repeat CPAP titration study since her last visit, recommended a trial of CPAP 15 cm water. She seems to be tolerating her CPAP fairly well but having a lot of mouth and lip dryness even with the humidity on. She initially was on a nasal mask but had leakage when she opened her mouth. She is compliant with her O2. She is on Anoro, believes that she gets gets more sob at night. She remains fairly active, has slowed down some - no longer tends to her chickens. She isn';t great about wearing her O2 w exertion. She is on zyrtec and flonase - allergies still give her some sx, cough, sneezing. She is planning to have cataract sgy.   ROV 08/02/17 -- this follow-up visit for patient with a history of chronic hypoxemic respiratory failure on 2 L/m with exertion. She does not use oxygen at night. She has a history of COPD and untreated obstructive sleep apnea. She was unable to tolerate CPAP. We clearly documented that she is desaturated on  room air today at rest and with exertion. She notes a daily nonproductive cough. She has occasional wheezing especially at night. Uses albuterol with some relief. She tells me that she is going to change from [facility] to a new oxygen provider.   Review of Systems As per HPI      Objective:   Physical Exam There were no vitals filed for this visit. Gen: Pleasant, well-nourished, in no distress,  normal affect  ENT: No lesions,  mouth clear,  oropharynx clear, no postnasal drip  Neck: No JVD, no TMG, no carotid bruits  Lungs: No use of accessory muscles, Positive wheezing on forced expiration  Cardiovascular: RRR, heart sounds normal, no murmur or gallops, no peripheral edema  Musculoskeletal: No deformities, no cyanosis or clubbing  Neuro: alert, non focal  Skin: Warm, no lesions or rashes      Assessment & Plan:  COPD (chronic obstructive pulmonary disease) We will continue her current Anoro  Chronic respiratory failure (HCC) Chronic hypoxemic respiratory failure. She needs a new oxygen company. We documented hypoxemia at rest on room air. We will perform a walking oximetry and document was exertional desaturation and also confirm appropriate dosing to keep her greater than 90%. Given her history of obstructive sleep apnea we will also perform an overnight oximetry on room air and titrate overnight oxygen appropriately.  Levy Pupa, MD, PhD 08/23/2017, 5:44 PM [facility] [phone] or  if no answer [phone]

## 2017-08-24 NOTE — Telephone Encounter (Signed)
Called and spoke with Taylor Burnett and she is aware of the note that has been completed and she will go in the chart and print it off.  Nothing further is needed.

## 2017-08-24 NOTE — Telephone Encounter (Signed)
Called [facility], advised by answering service to call back after 1pm

## 2017-09-19 ENCOUNTER — Telehealth: Payer: Self-pay | Admitting: Emergency Medicine

## 2017-09-19 ENCOUNTER — Other Ambulatory Visit: Payer: Self-pay | Admitting: Emergency Medicine

## 2017-09-19 ENCOUNTER — Ambulatory Visit: Payer: Medicaid Other | Admitting: Family Medicine

## 2017-09-19 ENCOUNTER — Encounter: Payer: Self-pay | Admitting: Emergency Medicine

## 2017-09-19 NOTE — Telephone Encounter (Signed)
Received the following message from patient:  Taylor Burnett  to Leslye Peer, MD       12:38 PM  My prescription for Anoro has run out. Also I still don't know when I'm supposed to come back to see Dr. Delton Coombes. The computers were down last time I was there. He had also wanted to do an over night check on oxygen levels to see if I needed it when sleeping. So far not heard anything. I'm sorry to bother you. I know you are busy.    Waiting to hear back about her preferred pharmacy for the Anoro.  PCCs, do you all know the status of the ONO. It looks like it was sent to [facility] back on 08/03/17.   Dr. Delton Coombes, when you like for the patient to follow up with you? She saw you on 08/02/17. Thanks!

## 2017-09-19 NOTE — Telephone Encounter (Signed)
Staff message sent to Melissa 

## 2017-09-20 NOTE — Telephone Encounter (Signed)
I will send a community message to [facility] which is where this order was sent

## 2017-09-20 NOTE — Telephone Encounter (Signed)
Gilda from [facility] message me back stated they have pulled the order and will be in contact with the patient.  If she needs the number they are [phone]

## 2017-09-20 NOTE — Telephone Encounter (Signed)
Spoke with pt, and made her aware of below message. Nothing further needed.

## 2017-09-26 DIAGNOSIS — Z961 Presence of intraocular lens: Secondary | ICD-10-CM | POA: Diagnosis not present

## 2017-10-10 ENCOUNTER — Encounter: Payer: Self-pay | Admitting: Emergency Medicine

## 2017-10-10 NOTE — Telephone Encounter (Signed)
RB pt is calling about her results of the over night sleep test 3 weeks ago.   Please advise. Thanks

## 2017-10-19 NOTE — Telephone Encounter (Signed)
Please let her know that her overnight oximetry does confirm low oxygen levels while sleeping on room air.  She needs to wear her oxygen at night.

## 2017-10-23 NOTE — Telephone Encounter (Signed)
Spoke with pt. She is aware of results. Nothing further was needed.  

## 2017-10-27 ENCOUNTER — Ambulatory Visit: Payer: Medicaid Other | Admitting: Emergency Medicine

## 2018-01-25 ENCOUNTER — Encounter: Payer: Self-pay | Admitting: Emergency Medicine

## 2018-01-25 ENCOUNTER — Ambulatory Visit (INDEPENDENT_AMBULATORY_CARE_PROVIDER_SITE_OTHER): Payer: Medicare Other | Admitting: Emergency Medicine

## 2018-01-25 DIAGNOSIS — G4730 Sleep apnea, unspecified: Secondary | ICD-10-CM

## 2018-01-25 DIAGNOSIS — J310 Chronic rhinitis: Secondary | ICD-10-CM | POA: Diagnosis not present

## 2018-01-25 DIAGNOSIS — J432 Centrilobular emphysema: Secondary | ICD-10-CM | POA: Diagnosis not present

## 2018-01-25 NOTE — Progress Notes (Signed)
Subjective:    Patient ID: Taylor Burnett, female    DOB: [birth year], 67 y.o.   MRN: [id number]  HPI 66 yo woman, former smoker, has been followed by Dr Shelle Iron for COPD, Allergic rhinitis, cough, mild obstructive sleep apnea  ROV 11/14/16 -- follow up visit for COPD and allergic rhinitis. Mild OSA based on her PSG from 07/2016. At our last visit she described progressive exertional SOB, occasional wheeze. She has been managed on Anoro. We repeat her PFT today that I have reviewed > shows very severe obstruction without BD response, severe hyperinflation, decreased diffusion capacity.  She was treated with azithro earlier this month - now feels back to baseline. She does not have home O2. She uses albuterol rarely due to agitation and side effects. On flonase + zyrtec. Still some mucous but better than last time. She is to see Dr Allyson Sabal on 11/29.   ROV 04/18/17 -- patient has a history of COPD (very severe obstruction), allergic rhinitis, obstructive sleep apnea. We performed a repeat CPAP titration study since her last visit, recommended a trial of CPAP 15 cm water. She seems to be tolerating her CPAP fairly well but having a lot of mouth and lip dryness even with the humidity on. She initially was on a nasal mask but had leakage when she opened her mouth. She is compliant with her O2. She is on Anoro, believes that she gets gets more sob at night. She remains fairly active, has slowed down some - no longer tends to her chickens. She isn';t great about wearing her O2 w exertion. She is on zyrtec and flonase - allergies still give her some sx, cough, sneezing. She is planning to have cataract sgy.   ROV 08/02/17 -- this follow-up visit for patient with a history of chronic hypoxemic respiratory failure on 2 L/m with exertion. She does not use oxygen at night. She has a history of COPD and untreated obstructive sleep apnea. She was unable to tolerate CPAP. We clearly documented that she is desaturated on  room air today at rest and with exertion. She notes a daily nonproductive cough. She has occasional wheezing especially at night. Uses albuterol with some relief. She tells me that she is going to change from [facility] to a new oxygen provider.  ROV 01/25/18 --follow-up visit today for COPD, untreated obstructive sleep apnea (could not tolerate CPAP), exertional hypoxemia.  Also with a history of cough. She is wearing O2 at night 2L/min since recent ONO. She has noted some exertional desats. She is on Anoro, benefits from it.  No flares since last time. A lot of dry cough. Uses zyrtec, has flonase prn.   Review of Systems As per HPI      Objective:   Physical Exam Vitals:   01/25/18 1528  BP: 110/70  Pulse: 88  SpO2: 91%  Weight: 143 lb (64.9 kg)  Height: 5\' 3"  (1.6 m)   Gen: Pleasant, well-nourished, in no distress,  normal affect  ENT: No lesions,  mouth clear,  oropharynx clear, no postnasal drip  Neck: No JVD, no stridor  Lungs: No use of accessory muscles, bibasilar inspiratory crackles, no wheezes  Cardiovascular: RRR, heart sounds normal, no murmur or gallops, no peripheral edema  Musculoskeletal: No deformities, no cyanosis or clubbing  Neuro: alert, non focal  Skin: Warm, no lesions or rashes      Assessment & Plan:  COPD (chronic obstructive pulmonary disease) with emphysema (HCC) Please continue to Anoro every day  Keep albuterol available to use 2 puffs if needed for shortness of breath. Wear your oxygen with exertion and at night while sleeping. Follow with Dr Delton Coombes in 6 months or sooner if you have any problems  Chronic rhinitis Continue your Zyrtec every day. Keep Flonase available to use 2 sprays each nostril as needed for congestion  Sleep apnea in adult Currently wearing oxygen at night.  We may decide to reconsider CPAP at some point in the future.  She did not have good compliance but states that this was mainly because of her poor sleep  hygiene as opposed to inability or unwillingness to wear the mask.  Levy Pupa, MD, PhD 01/25/2018, 4:09 PM [facility] [phone] or if no answer [phone]

## 2018-01-25 NOTE — Assessment & Plan Note (Signed)
Currently wearing oxygen at night.  We may decide to reconsider CPAP at some point in the future.  She did not have good compliance but states that this was mainly because of her poor sleep hygiene as opposed to inability or unwillingness to wear the mask.

## 2018-01-25 NOTE — Assessment & Plan Note (Signed)
Continue your Zyrtec every day. Keep Flonase available to use 2 sprays each nostril as needed for congestion

## 2018-01-25 NOTE — Assessment & Plan Note (Signed)
Please continue to Anoro every day Keep albuterol available to use 2 puffs if needed for shortness of breath. Wear your oxygen with exertion and at night while sleeping. Follow with Dr Delton Coombes in 6 months or sooner if you have any problems

## 2018-01-25 NOTE — Patient Instructions (Signed)
Please continue to Anoro every day Keep albuterol available to use 2 puffs if needed for shortness of breath. Continue your Zyrtec every day. Keep Flonase available to use 2 sprays each nostril as needed for congestion Wear your oxygen with exertion and at night while sleeping. Follow with Dr Delton Coombes in 6 months or sooner if you have any problems

## 2018-03-29 ENCOUNTER — Other Ambulatory Visit: Payer: Self-pay

## 2018-03-29 ENCOUNTER — Other Ambulatory Visit: Payer: Self-pay | Admitting: Emergency Medicine

## 2018-03-29 ENCOUNTER — Telehealth: Payer: Self-pay | Admitting: Emergency Medicine

## 2018-03-29 ENCOUNTER — Encounter: Payer: Self-pay | Admitting: Emergency Medicine

## 2018-03-29 MED ORDER — UMECLIDINIUM-VILANTEROL 62.5-25 MCG/INH IN AEPB: INHALATION_SPRAY | RESPIRATORY_TRACT | 3 refills | Status: DC

## 2018-03-29 NOTE — Telephone Encounter (Signed)
rec'd e-mail from patient needing refill on Anoro Placed RX today to pharmacy Replied back to patient today Nothing further needed at this time

## 2018-04-05 ENCOUNTER — Encounter: Payer: Self-pay | Admitting: *Deleted

## 2018-04-19 NOTE — Telephone Encounter (Signed)
Anoro ordered this encounter

## 2018-04-20 ENCOUNTER — Ambulatory Visit (INDEPENDENT_AMBULATORY_CARE_PROVIDER_SITE_OTHER): Payer: Medicare Other | Admitting: Family Medicine

## 2018-04-20 ENCOUNTER — Encounter: Payer: Self-pay | Admitting: Family Medicine

## 2018-04-20 VITALS — BP 128/80 | HR 78 | Temp 97.6°F | Resp 18 | Ht 63.0 in | Wt 143.0 lb

## 2018-04-20 DIAGNOSIS — Z981 Arthrodesis status: Secondary | ICD-10-CM

## 2018-04-20 DIAGNOSIS — J441 Chronic obstructive pulmonary disease with (acute) exacerbation: Secondary | ICD-10-CM | POA: Diagnosis not present

## 2018-04-20 DIAGNOSIS — M810 Age-related osteoporosis without current pathological fracture: Secondary | ICD-10-CM | POA: Diagnosis not present

## 2018-04-20 DIAGNOSIS — Z1239 Encounter for other screening for malignant neoplasm of breast: Secondary | ICD-10-CM

## 2018-04-20 DIAGNOSIS — Z1211 Encounter for screening for malignant neoplasm of colon: Secondary | ICD-10-CM | POA: Diagnosis not present

## 2018-04-20 DIAGNOSIS — Z1231 Encounter for screening mammogram for malignant neoplasm of breast: Secondary | ICD-10-CM | POA: Diagnosis not present

## 2018-04-20 DIAGNOSIS — R7303 Prediabetes: Secondary | ICD-10-CM

## 2018-04-20 DIAGNOSIS — Z23 Encounter for immunization: Secondary | ICD-10-CM | POA: Diagnosis not present

## 2018-04-20 DIAGNOSIS — G4730 Sleep apnea, unspecified: Secondary | ICD-10-CM

## 2018-04-20 DIAGNOSIS — R7309 Other abnormal glucose: Secondary | ICD-10-CM | POA: Diagnosis not present

## 2018-04-20 DIAGNOSIS — Z13220 Encounter for screening for lipoid disorders: Secondary | ICD-10-CM | POA: Diagnosis not present

## 2018-04-20 MED ORDER — ALENDRONATE SODIUM 70 MG PO TABS: 70.0000 mg | ORAL_TABLET | ORAL | 11 refills | Status: DC

## 2018-04-20 MED ORDER — AZITHROMYCIN 250 MG PO TABS: ORAL_TABLET | ORAL | 0 refills | Status: DC

## 2018-04-20 NOTE — Progress Notes (Signed)
Subjective:    Patient ID: Taylor Burnett, female    DOB: [birth year], 67 y.o.   MRN: [id number]  HPI  Patient is here today to establish care.  She is a 67 year old white female with a history of COPD which is oxygen dependent.  She also has a history of cervical spinal fusion.  She reports chronic insomnia and has tried and failed Ambien, Lunesta, amitriptyline, and hydroxyzine.  She also has a history of osteoporosis.  T score on a bone density test in 2015 was -3.6 in the spine.  She is not taking any calcium or vitamin D or bone building therapy.  She is overdue for mammogram.  She is overdue for repeat bone density.  She is overdue for colonoscopy.  She declines a colonoscopy because of her COPD.  She is overdue for Pneumovax 23 given her history.  She has very reticent to receive that vaccination.  I spent more than 10 minutes today discussing this with her and ultimately convinced her to receive Pneumovax 23.  She also reports a worsening cough over the last few days productive of green sputum.  On exam today, she has bibasilar crackles right greater than left.  She also reports increased sputum production and change in the character of the sputum. Past Medical History:  Diagnosis Date  . Arthritis   . Cataract   . COPD (chronic obstructive pulmonary disease) (HCC)   . Cough   . DDD (degenerative disc disease), cervical   . Dysrhythmia    RAPID HEART BEAT OCC   . Emphysema of lung (HCC)   . GERD (gastroesophageal reflux disease)    OCC  . H/O degenerative disc disease   . Headache   . Myocardial infarction (HCC) 2016  . Osteoporosis   . Panic attacks   . Pre-diabetes   . Severe lung disease   . Shortness of breath dyspnea   . Sleep apnea   . Spondylosis    Past Surgical History:  Procedure Laterality Date  . ANTERIOR CERVICAL DECOMP/DISCECTOMY FUSION N/A 03/13/2015   Procedure: CERVICAL THREE-CERVICAL FOUR, CERVICAL FOUR-CERVICAL FIVE, CERVICAL FIVE-CERVICAL SIX ANTERIOR  CERVICAL DECOMPRESSION/DISCECTOMY FUSION 3 LEVELS;  Surgeon: Tia Alert, MD;  Location: [facility];  Service: Neurosurgery;  Laterality: N/A;  . BREAST ENHANCEMENT SURGERY  1990  . CARDIAC CATHETERIZATION  1995  . CATARACT EXTRACTION W/PHACO Right 05/02/2017   Procedure: CATARACT EXTRACTION PHACO AND INTRAOCULAR LENS PLACEMENT (IOC);  Surgeon: Jethro Bolus, MD;  Location: [facility];  Service: Ophthalmology;  Laterality: Right;  CDE: 6.22  . CATARACT EXTRACTION W/PHACO Left 05/16/2017   Procedure: CATARACT EXTRACTION PHACO AND INTRAOCULAR LENS PLACEMENT (IOC);  Surgeon: Jethro Bolus, MD;  Location: [facility];  Service: Ophthalmology;  Laterality: Left;  CDE: 4.10  . TONSILLECTOMY    . TUBAL LIGATION     Current Outpatient Medications on File Prior to Visit  Medication Sig Dispense Refill  . Ascorbic Acid (VITAMIN C) 1000 MG tablet Take 1,000 mg by mouth daily.    . cetirizine (ZYRTEC) 10 MG tablet Take 10 mg by mouth at bedtime.     . Cholecalciferol (VITAMIN D) 2000 units CAPS Take by mouth.    . fluticasone (FLONASE) 50 MCG/ACT nasal spray Place 2 sprays into both nostrils daily. (Patient taking differently: Place 2 sprays into both nostrils daily as needed for allergies. ) 16 g 5  . naproxen sodium (ALEVE) 220 MG tablet Take 220 mg by mouth.    . OXYGEN Inhale 2  L into the lungs as needed. FOR USE WITH EXERTION    . PROAIR HFA 108 (90 Base) MCG/ACT inhaler INHALE 2 PUFFS INTO THE LUNGS EVERY 6 HOURS AS NEEDED. 8.5 g 0  . umeclidinium-vilanterol (ANORO ELLIPTA) 62.5-25 MCG/INH AEPB INHALE ONE PUFF INTO THE LUNGS EVERY MORNING. 60 each 3  . vitamin B-12 (CYANOCOBALAMIN) 100 MCG tablet Take 100 mcg by mouth daily.    Marland Kitchen VITAMIN E PO Take by mouth.     No current facility-administered medications on file prior to visit.    Allergies  Allergen Reactions  . Prednisone Other (See Comments)    Depression and suicidal   Social History   Socioeconomic History  . Marital status: Widowed     Spouse name: Not on file  . Number of children: Not on file  . Years of education: Not on file  . Highest education level: Not on file  Occupational History  . Occupation: on disability.  Social Needs  . Financial resource strain: Not on file  . Food insecurity:    Worry: Not on file    Inability: Not on file  . Transportation needs:    Medical: Not on file    Non-medical: Not on file  Tobacco Use  . Smoking status: Current Every Day Smoker    Packs/day: 2.00    Years: 40.00    Pack years: 80.00    Types: Cigarettes    Last attempt to quit: 07/31/2013    Years since quitting: 4.7  . Smokeless tobacco: Never Used  Substance and Sexual Activity  . Alcohol use: No    Comment: OCC WINE   . Drug use: No  . Sexual activity: Not on file  Lifestyle  . Physical activity:    Days per week: Not on file    Minutes per session: Not on file  . Stress: Not on file  Relationships  . Social connections:    Talks on phone: Not on file    Gets together: Not on file    Attends religious service: Not on file    Active member of club or organization: Not on file    Attends meetings of clubs or organizations: Not on file    Relationship status: Not on file  . Intimate partner violence:    Fear of current or ex partner: Not on file    Emotionally abused: Not on file    Physically abused: Not on file    Forced sexual activity: Not on file  Other Topics Concern  . Not on file  Social History Narrative   Divorced and lives alone.       Review of Systems  All other systems reviewed and are negative.      Objective:   Physical Exam  Constitutional: She appears well-developed and well-nourished. No distress.  HENT:  Head: Normocephalic and atraumatic.  Nose: Nose normal.  Mouth/Throat: Oropharynx is clear and moist.  Eyes: Pupils are equal, round, and reactive to light. Conjunctivae and EOM are normal.  Neck: Neck supple. No JVD present.  Cardiovascular: Normal rate, regular  rhythm and normal heart sounds. Exam reveals no gallop and no friction rub.  No murmur heard. Pulmonary/Chest: Effort normal. She has wheezes. She has rales.  Abdominal: Soft.  Musculoskeletal: She exhibits no edema.  Skin: She is not diaphoretic.  Vitals reviewed.         Assessment & Plan:  COPD exacerbation (HCC) - Plan: azithromycin (ZITHROMAX) 250 MG tablet  Prediabetes - Plan:  CBC with Differential/Platelet, COMPLETE METABOLIC PANEL WITH GFR, Lipid panel, Hemoglobin A1c  Need for prophylactic vaccination against Streptococcus pneumoniae (pneumococcus) - Plan: Pneumococcal polysaccharide vaccine 23-valent greater than or equal to 2yo subcutaneous/IM  Colon cancer screening  Sleep apnea in adult  S/P cervical spinal fusion  I am concerned the patient is having a COPD exacerbation and therefore I will treat her with a Z-Pak.  I convinced the patient to receive Pneumovax 23 today in clinic.  Patient refuses colonoscopy however I did discuss cologuard with her and she is willing to proceed with this.  I will have that information shipped to her home.  She declines a Pap smear at the present time.  Density was last checked in 2015.  It showed significant osteoporosis then which has been untreated.  Begin Fosamax 70 mg a week and repeat a bone density today to establish a baseline.  Repeat bone density again in 2 years to assess the response to therapy.  Schedule the patient for a mammogram.  Recommended trazodone for insomnia.  Avoid any addictive medications.  Patient declines trazodone.  Patient also has a history of prediabetes.  I will check a hemoglobin A1c, fasting lipid panel, CBC, and CMP.

## 2018-04-21 LAB — CBC WITH DIFFERENTIAL/PLATELET
BASOS ABS: 32 {cells}/uL (ref 0–200)
Basophils Relative: 0.5 %
EOS PCT: 4.1 %
Eosinophils Absolute: 262 cells/uL (ref 15–500)
HCT: 43.3 % (ref 35.0–45.0)
HEMOGLOBIN: 14.5 g/dL (ref 11.7–15.5)
Lymphs Abs: 1734 cells/uL (ref 850–3900)
MCH: 30.4 pg (ref 27.0–33.0)
MCHC: 33.5 g/dL (ref 32.0–36.0)
MCV: 90.8 fL (ref 80.0–100.0)
MONOS PCT: 7.4 %
MPV: 9.2 fL (ref 7.5–12.5)
Neutro Abs: 3898 cells/uL (ref 1500–7800)
Neutrophils Relative %: 60.9 %
PLATELETS: 274 10*3/uL (ref 140–400)
RBC: 4.77 10*6/uL (ref 3.80–5.10)
RDW: 12.2 % (ref 11.0–15.0)
TOTAL LYMPHOCYTE: 27.1 %
WBC mixed population: 474 cells/uL (ref 200–950)
WBC: 6.4 10*3/uL (ref 3.8–10.8)

## 2018-04-21 LAB — COMPLETE METABOLIC PANEL WITH GFR
AG Ratio: 1.9 (calc) (ref 1.0–2.5)
ALBUMIN MSPROF: 4.6 g/dL (ref 3.6–5.1)
ALT: 9 U/L (ref 6–29)
AST: 14 U/L (ref 10–35)
Alkaline phosphatase (APISO): 55 U/L (ref 33–130)
BILIRUBIN TOTAL: 0.5 mg/dL (ref 0.2–1.2)
BUN: 16 mg/dL (ref 7–25)
CHLORIDE: 102 mmol/L (ref 98–110)
CO2: 30 mmol/L (ref 20–32)
CREATININE: 0.81 mg/dL (ref 0.50–0.99)
Calcium: 9.8 mg/dL (ref 8.6–10.4)
GFR, Est African American: 88 mL/min/{1.73_m2} (ref >=60)
GFR, Est Non African American: 76 mL/min/{1.73_m2} (ref >=60)
GLUCOSE: 95 mg/dL (ref 65–99)
Globulin: 2.4 g/dL (calc) (ref 1.9–3.7)
Potassium: 4.5 mmol/L (ref 3.5–5.3)
Sodium: 141 mmol/L (ref 135–146)
Total Protein: 7 g/dL (ref 6.1–8.1)

## 2018-04-21 LAB — LIPID PANEL
CHOL/HDL RATIO: 2.9 (calc) (ref <5.0)
Cholesterol: 201 mg/dL — ABNORMAL HIGH (ref <200)
HDL: 69 mg/dL (ref >50)
LDL CHOLESTEROL (CALC): 114 mg/dL — AB
Non-HDL Cholesterol (Calc): 132 mg/dL (calc) — ABNORMAL HIGH (ref <130)
Triglycerides: 83 mg/dL (ref <150)

## 2018-04-21 LAB — HEMOGLOBIN A1C
EAG (MMOL/L): 6.2 (calc)
HEMOGLOBIN A1C: 5.5 %{Hb} (ref <5.7)
MEAN PLASMA GLUCOSE: 111 (calc)

## 2018-04-23 ENCOUNTER — Encounter: Payer: Self-pay | Admitting: Family Medicine

## 2018-05-01 DIAGNOSIS — Z1212 Encounter for screening for malignant neoplasm of rectum: Secondary | ICD-10-CM | POA: Diagnosis not present

## 2018-05-01 DIAGNOSIS — Z1211 Encounter for screening for malignant neoplasm of colon: Secondary | ICD-10-CM | POA: Diagnosis not present

## 2018-05-11 LAB — COLOGUARD

## 2018-05-18 ENCOUNTER — Encounter: Payer: Self-pay | Admitting: *Deleted

## 2018-06-14 ENCOUNTER — Ambulatory Visit
Admission: RE | Admit: 2018-06-14 | Discharge: 2018-06-14 | Disposition: A | Discharge status: Home / Self Care | Payer: Medicare Other | Source: Ambulatory Visit | Attending: Family Medicine | Admitting: Family Medicine

## 2018-06-14 DIAGNOSIS — Z1239 Encounter for other screening for malignant neoplasm of breast: Secondary | ICD-10-CM

## 2018-06-14 DIAGNOSIS — Z1231 Encounter for screening mammogram for malignant neoplasm of breast: Secondary | ICD-10-CM | POA: Diagnosis not present

## 2018-06-14 DIAGNOSIS — Z780 Asymptomatic menopausal state: Secondary | ICD-10-CM | POA: Diagnosis not present

## 2018-06-14 DIAGNOSIS — M810 Age-related osteoporosis without current pathological fracture: Secondary | ICD-10-CM | POA: Diagnosis not present

## 2018-06-19 ENCOUNTER — Encounter: Payer: Self-pay | Admitting: Family Medicine

## 2018-06-21 ENCOUNTER — Other Ambulatory Visit: Payer: Self-pay | Admitting: Emergency Medicine

## 2018-07-12 ENCOUNTER — Encounter: Payer: Self-pay | Admitting: Emergency Medicine

## 2018-07-12 ENCOUNTER — Encounter: Payer: Self-pay | Admitting: *Deleted

## 2018-07-12 NOTE — Telephone Encounter (Signed)
OK with me to give her a jury excuse request.  Dx is severe COPD, chronic cough / bronchitis.

## 2018-07-12 NOTE — Telephone Encounter (Signed)
Because patient specifically states that she was told the letter can be faxed, have called [facility] @ [phone].  Spoke with Eunice Blase who confirmed that jury excusals can indeed be faxed or emailed.  Juror number is NOT required as long as Eunice Blase can find the patient in her system (which she was able to do while on the phone with me) and Eunice Blase does NOT need any diagnoses - the letter just needs to state that for medical reasons patient should be excused from jury duty.  Per Eunice Blase, she needs this letter this morning as patient is scheduled to serve next week.  Because of the deadline, will route to RB marked urgent and text/page him as well Please advise if okay to excuse patient from jury duty   E-mail from patient: Taylor Burnett, Taylor Burnett to Leslye Peer, MD  07/12/18 8:26 AM  I need a physicians excuse to relieve me from jury duty. I've been having alot of problems with a deep cough. Scents seem to send me into a coughing frenzy and I get very congested. Sometimes for no reason I just start up but certain scents will really trigger it. I have an appointment with Dr. Delton Coombes on the 26th. Could you please do this for me. I really don't want to be embarrassed in public.  They told me you could fax a physician's excuse to them at:  [phone],  Attention: Bary Castilla  I would appreciate it very much! Please let me know if this is possible. Thank you

## 2018-07-12 NOTE — Telephone Encounter (Signed)
Letter drafted and faxed to [phone]. E-mail sent to patient informing her of the above. Nothing further needed; will sign off.

## 2018-07-20 ENCOUNTER — Ambulatory Visit (INDEPENDENT_AMBULATORY_CARE_PROVIDER_SITE_OTHER): Payer: Medicare Other | Admitting: Emergency Medicine

## 2018-07-20 ENCOUNTER — Encounter: Payer: Self-pay | Admitting: Emergency Medicine

## 2018-07-20 DIAGNOSIS — J438 Other emphysema: Secondary | ICD-10-CM

## 2018-07-20 DIAGNOSIS — J9611 Chronic respiratory failure with hypoxia: Secondary | ICD-10-CM

## 2018-07-20 DIAGNOSIS — J310 Chronic rhinitis: Secondary | ICD-10-CM

## 2018-07-20 DIAGNOSIS — G4730 Sleep apnea, unspecified: Secondary | ICD-10-CM

## 2018-07-20 DIAGNOSIS — K219 Gastro-esophageal reflux disease without esophagitis: Secondary | ICD-10-CM | POA: Insufficient documentation

## 2018-07-20 MED ORDER — FLUTICASONE PROPIONATE 50 MCG/ACT NA SUSP: 2.0000 | Freq: Every day | NASAL | 2 refills | Status: DC

## 2018-07-20 MED ORDER — ESOMEPRAZOLE MAGNESIUM 40 MG PO CPDR: 40.0000 mg | DELAYED_RELEASE_CAPSULE | Freq: Every day | ORAL | 0 refills | Status: DC

## 2018-07-20 NOTE — Assessment & Plan Note (Signed)
She is using O2 w exertion and sleeping.

## 2018-07-20 NOTE — Patient Instructions (Addendum)
We will continue Anoro once a day Keep albuterol available to use 2 puffs if needed for shortness of breath, chest tightness, wheezing. Continue Zyrtec once daily. Start taking fluticasone nasal spray 2 sprays each nostril every day for the next month.  Hopefully this will help decrease her cough.  At that time you can start using on an as-needed basis again. Start taking your Nexium 40 mg every day for the next month.  At that time being on how your cough is doing you may want to try stopping it Your Pneumovax pneumonia shot is up-to-date.  You need to get the Prevnar 13 pneumonia shot next year. Get the flu shot this fall. Follow with Dr Delton Coombes in 6 months or sooner if you have any problems

## 2018-07-20 NOTE — Assessment & Plan Note (Signed)
She had questions about her PFT today.  I reviewed these with her from 2017.  That showed very severe obstruction.  She continues to have daily symptoms particularly cough.  Of asked her to continue Anoro, use albuterol as needed.  We will also concentrate on other contributing factors including rhinitis, GERD which is untreated.  She has had the Pneumovax this year.  She needs the Prevnar in 2020.

## 2018-07-20 NOTE — Assessment & Plan Note (Signed)
Start nexium on a schedule for the next month to see if treating more aggressively help w her cough,.

## 2018-07-20 NOTE — Assessment & Plan Note (Signed)
Untreated - cannot tolerate CPAP

## 2018-07-20 NOTE — Assessment & Plan Note (Signed)
Add fluticasone NS every day for at least the next month to see if this helps w cough. Continue zyrtec

## 2018-07-20 NOTE — Progress Notes (Signed)
   Subjective:    Patient ID: Taylor Burnett, female    DOB: June 10, 1951, 67 y.o.   MRN: [id number]  HPI 67 yo woman, former smoker, has been followed by Dr Shelle Iron for COPD, Allergic rhinitis, cough, mild obstructive sleep apnea  ROV 07/20/18 --67 year old woman who follows up today for COPD (very severe obstruction on pulmonary function testing without a BD response), untreated obstructive sleep apnea, exertional hypoxemia.  She has nocturnal hypoxemia as well.  She reports that she was treated w abx in April for ? Bronchitis. She improved some, does continue to have daily, minimally productive. Can be triggered by smells, by humidity.  Currently managed on Anoro. She is on zyrtec, uses flonase but only prn. She does have frequent reflux. Rarely uses nexium. She should get the Prevnar in 2020. She uses her o2 reliably.    Review of Systems As per HPI      Objective:   Physical Exam Vitals:   07/20/18 1338  BP: 118/80  Pulse: 74  SpO2: 95%  Weight: 143 lb (64.9 kg)  Height: 5' (1.524 m)   Gen: Pleasant, well-nourished, in no distress,  normal affect  ENT: No lesions,  mouth clear,  oropharynx clear, no postnasal drip  Neck: No JVD, no stridor  Lungs: No use of accessory muscles, distant, clear, no wheeze  Cardiovascular: RRR, heart sounds normal, no murmur or gallops, no peripheral edema  Musculoskeletal: No deformities, no cyanosis or clubbing  Neuro: alert, non focal  Skin: Warm, no lesions or rashes      Assessment & Plan:  COPD (chronic obstructive pulmonary disease) She had questions about her PFT today.  I reviewed these with her from 2017.  That showed very severe obstruction.  She continues to have daily symptoms particularly cough.  Of asked her to continue Anoro, use albuterol as needed.  We will also concentrate on other contributing factors including rhinitis, GERD which is untreated.  She has had the Pneumovax this year.  She needs the Prevnar in 2020.  Sleep  apnea in adult Untreated - cannot tolerate CPAP  Chronic respiratory failure (HCC) She is using O2 w exertion and sleeping.   Chronic rhinitis Add fluticasone NS every day for at least the next month to see if this helps w cough. Continue zyrtec  GERD (gastroesophageal reflux disease) Start nexium on a schedule for the next month to see if treating more aggressively help w her cough,.   Levy Pupa, MD, PhD 07/20/2018, 1:57 PM [facility] [phone] or if no answer [phone]

## 2018-08-07 ENCOUNTER — Ambulatory Visit (INDEPENDENT_AMBULATORY_CARE_PROVIDER_SITE_OTHER): Payer: Medicare Other

## 2018-08-07 DIAGNOSIS — M810 Age-related osteoporosis without current pathological fracture: Secondary | ICD-10-CM

## 2018-08-07 MED ORDER — DENOSUMAB 60 MG/ML ~~LOC~~ SOSY
60.0000 mg | PREFILLED_SYRINGE | Freq: Once | SUBCUTANEOUS | Status: AC
  Administered 2018-08-07: 60 mg via SUBCUTANEOUS

## 2018-08-07 NOTE — Progress Notes (Signed)
Patient came in today to receive 1st dose of prolia. Prolia was given in the left arm. Patient tolerated well. Advised patient next dose is due in 6 months.

## 2018-08-27 ENCOUNTER — Other Ambulatory Visit: Payer: Self-pay | Admitting: Emergency Medicine

## 2018-10-02 ENCOUNTER — Encounter: Payer: Self-pay | Admitting: Family Medicine

## 2018-10-03 ENCOUNTER — Ambulatory Visit (INDEPENDENT_AMBULATORY_CARE_PROVIDER_SITE_OTHER): Payer: Medicare Other | Admitting: Nurse Practitioner

## 2018-10-03 ENCOUNTER — Ambulatory Visit (INDEPENDENT_AMBULATORY_CARE_PROVIDER_SITE_OTHER)
Admission: RE | Admit: 2018-10-03 | Discharge: 2018-10-03 | Disposition: A | Discharge status: Home / Self Care | Payer: Medicare Other | Source: Ambulatory Visit | Attending: Nurse Practitioner | Admitting: Nurse Practitioner

## 2018-10-03 ENCOUNTER — Encounter: Payer: Self-pay | Admitting: Nurse Practitioner

## 2018-10-03 VITALS — BP 122/78 | HR 71 | Ht 60.0 in | Wt 143.0 lb

## 2018-10-03 DIAGNOSIS — R053 Chronic cough: Secondary | ICD-10-CM

## 2018-10-03 DIAGNOSIS — R05 Cough: Secondary | ICD-10-CM

## 2018-10-03 DIAGNOSIS — J310 Chronic rhinitis: Secondary | ICD-10-CM | POA: Diagnosis not present

## 2018-10-03 DIAGNOSIS — J9611 Chronic respiratory failure with hypoxia: Secondary | ICD-10-CM | POA: Diagnosis not present

## 2018-10-03 DIAGNOSIS — R0602 Shortness of breath: Secondary | ICD-10-CM | POA: Diagnosis not present

## 2018-10-03 MED ORDER — AMOXICILLIN-POT CLAVULANATE 875-125 MG PO TABS: 1.0000 | ORAL_TABLET | Freq: Two times a day (BID) | ORAL | 0 refills | Status: DC

## 2018-10-03 NOTE — Assessment & Plan Note (Signed)
Patient Instructions  Will order Augmentin Take mucinex DM and delsym for cough Will check chest x ray and call with results May consider chest CT and sinus CT if there is no improvement Continue all other medications Please follow up with Dr. Delton Coombes in 2-3 weeks

## 2018-10-03 NOTE — Patient Instructions (Addendum)
Will order Augmentin Take mucinex DM and delsym for cough Will check chest x ray and call with results May consider chest CT and sinus CT if there is no improvement Continue all other medications Please follow up with Dr. Delton Coombes in 2-3 weeks

## 2018-10-03 NOTE — Progress Notes (Signed)
@Patient  ID: Taylor Burnett, female    DOB: [birth year], 66 y.o.   MRN: [id number]  Chief Complaint  Patient presents with  . Cough    with congestion    Referring provider: Donita Brooks, MD  HPI  67 year old female, former smoker, with COPD, allergic rhinitis, chronic cough, and mild sleep apnea.  She is followed by Dr. Delton Coombes.  OV - 10/03/18 - chronic cough/sinus congestion Patient presents today with chronic cough and nasal congestion. States that symptoms have been ongoing for the past 8 months. Cough is mostly non productive. Wears O2 at night at 2 L and with exertion during the day. She has been using her rescue inhaler 1-2 x daily.  She is compliant with Anoro.  She states that she does have sinus pressure and pain with postnasal drip. She denies any fever chest pain or edema.    Allergies  Allergen Reactions  . Prednisone Other (See Comments)    Depression and suicidal    Immunization History  Administered Date(s) Administered  . Pneumococcal Polysaccharide-23 04/20/2018  . Tdap 06/06/2016    Past Medical History:  Diagnosis Date  . Arthritis   . Cataract   . COPD (chronic obstructive pulmonary disease) (HCC)   . Cough   . DDD (degenerative disc disease), cervical   . Dysrhythmia    RAPID HEART BEAT OCC   . Emphysema of lung (HCC)   . GERD (gastroesophageal reflux disease)    OCC  . H/O degenerative disc disease   . Headache   . Myocardial infarction (HCC) 2016  . Osteoporosis   . Panic attacks   . Pre-diabetes   . Severe lung disease   . Shortness of breath dyspnea   . Sleep apnea   . Spondylosis     Tobacco History: Social History   Tobacco Use  Smoking Status Former Smoker  . Packs/day: 2.00  . Years: 40.00  . Pack years: 80.00  . Types: Cigarettes  . Last attempt to quit: 07/31/2013  . Years since quitting: 5.1  Smokeless Tobacco Never Used   Counseling given: Yes   Outpatient Encounter Medications as of 10/03/2018  Medication Sig    . ANORO ELLIPTA 62.5-25 MCG/INH AEPB INHALE ONE PUFF INTO THE LUNGS EVERY MORNING.  . Ascorbic Acid (VITAMIN C) 1000 MG tablet Take 1,000 mg by mouth daily.  . cetirizine (ZYRTEC) 10 MG tablet Take 10 mg by mouth at bedtime.   . Cholecalciferol (VITAMIN D) 2000 units CAPS Take by mouth.  . esomeprazole (NEXIUM) 40 MG capsule Take 1 capsule (40 mg total) by mouth daily at 12 noon.  . fluticasone (FLONASE) 50 MCG/ACT nasal spray Place 2 sprays into both nostrils daily.  . naproxen sodium (ALEVE) 220 MG tablet Take 220 mg by mouth.  . OXYGEN Inhale 2 L into the lungs as needed. FOR USE WITH EXERTION  . PROAIR HFA 108 (90 Base) MCG/ACT inhaler INHALE 2 PUFFS INTO THE LUNGS EVERY 6 HOURS AS NEEDED.  Marland Kitchen vitamin B-12 (CYANOCOBALAMIN) 100 MCG tablet Take 100 mcg by mouth daily.  Marland Kitchen VITAMIN E PO Take by mouth.  . [DISCONTINUED] fluticasone (FLONASE) 50 MCG/ACT nasal spray Place 2 sprays into both nostrils daily. (Patient taking differently: Place 2 sprays into both nostrils daily as needed for allergies. )  . alendronate (FOSAMAX) 70 MG tablet Take 1 tablet (70 mg total) by mouth every 7 (seven) days. Take with a full glass of water on an empty stomach. (Patient not taking: Reported on  10/03/2018)  . amoxicillin-clavulanate (AUGMENTIN) 875-125 MG tablet Take 1 tablet by mouth 2 (two) times daily.   No facility-administered encounter medications on file as of 10/03/2018.      Review of Systems  Review of Systems  Constitutional: Negative.  Negative for chills and fever.  HENT: Positive for congestion, sinus pressure and sinus pain.   Respiratory: Positive for cough and shortness of breath. Negative for wheezing.   Cardiovascular: Negative.  Negative for chest pain, palpitations and leg swelling.  Gastrointestinal: Negative.   Allergic/Immunologic: Negative.   Neurological: Negative.   Psychiatric/Behavioral: Negative.        Physical Exam  BP 122/78 (BP Location: Right Arm, Patient Position:  Sitting, Cuff Size: Normal)   Pulse 71   Ht 5' (1.524 m)   Wt 143 lb (64.9 kg)   SpO2 (!) 86%   BMI 27.93 kg/m   Wt Readings from Last 5 Encounters:  10/03/18 143 lb (64.9 kg)  07/20/18 143 lb (64.9 kg)  04/20/18 143 lb (64.9 kg)  01/25/18 143 lb (64.9 kg)  06/19/17 138 lb (62.6 kg)     Physical Exam  Constitutional: She is oriented to person, place, and time. She appears well-developed and well-nourished. No distress.  Cardiovascular: Normal rate and regular rhythm.  Pulmonary/Chest: Effort normal and breath sounds normal. No respiratory distress. She has no wheezes. She has no rales.  Musculoskeletal: She exhibits no edema.  Neurological: She is alert and oriented to person, place, and time.  Psychiatric: She has a normal mood and affect.  Nursing note and vitals reviewed.     Assessment & Plan:   COPD (chronic obstructive pulmonary disease) with emphysema (HCC) Patient Instructions  Will order Augmentin Take mucinex DM and delsym for cough Will check chest x ray and call with results May consider chest CT and sinus CT if there is no improvement Continue all other medications Please follow up with Dr. Delton Coombes in 2-3 weeks     Chronic rhinitis Patient Instructions  Will order Augmentin Take mucinex DM and delsym for cough Will check chest x ray and call with results May consider chest CT and sinus CT if there is no improvement Continue all other medications Please follow up with Dr. Delton Coombes in 2-3 weeks     Chronic respiratory failure (HCC) Continue O2 at 2 L with exertion and at night     Ivonne Andrew, NP 10/03/2018

## 2018-10-03 NOTE — Assessment & Plan Note (Signed)
Continue O2 at 2 L with exertion and at night

## 2018-10-03 NOTE — Assessment & Plan Note (Signed)
Patient Instructions  Will order Augmentin Take mucinex DM and delsym for cough Will check chest x ray and call with results May consider chest CT and sinus CT if there is no improvement Continue all other medications Please follow up with Dr. Byrum in 2-3 weeks    

## 2018-10-22 ENCOUNTER — Ambulatory Visit (INDEPENDENT_AMBULATORY_CARE_PROVIDER_SITE_OTHER): Payer: Medicare Other | Admitting: Emergency Medicine

## 2018-10-22 ENCOUNTER — Encounter: Payer: Self-pay | Admitting: Emergency Medicine

## 2018-10-22 DIAGNOSIS — R05 Cough: Secondary | ICD-10-CM

## 2018-10-22 DIAGNOSIS — J432 Centrilobular emphysema: Secondary | ICD-10-CM | POA: Diagnosis not present

## 2018-10-22 DIAGNOSIS — R053 Chronic cough: Secondary | ICD-10-CM

## 2018-10-22 MED ORDER — TIOTROPIUM BROMIDE-OLODATEROL 2.5-2.5 MCG/ACT IN AERS: 2.0000 | INHALATION_SPRAY | Freq: Every day | RESPIRATORY_TRACT | 5 refills | Status: DC

## 2018-10-22 NOTE — Progress Notes (Signed)
   Subjective:    Patient ID: Taylor Burnett, female    DOB: Jul 11, 1951, 67 y.o.   MRN: [id number]  HPI 67 yo woman, former smoker, has been followed by Dr Shelle Iron for COPD, Allergic rhinitis, cough, mild obstructive sleep apnea  ROV 07/20/18 --67 year old woman who follows up today for COPD (very severe obstruction on pulmonary function testing without a BD response), untreated obstructive sleep apnea, exertional hypoxemia.  She has nocturnal hypoxemia as well.  She reports that she was treated w abx in April for ? Bronchitis. She improved some, does continue to have daily, minimally productive. Can be triggered by smells, by humidity.  Currently managed on Anoro. She is on zyrtec, uses flonase but only prn. She does have frequent reflux. Rarely uses nexium. She should get the Prevnar in 2020. She uses her o2 reliably.   ROV 10/22/18 --67 year old woman with a history of COPD, very severe obstruction by PFT, untreated sleep apnea, hypoxemia, chronic cough in the setting of allergic rhinitis and GERD.  She was seen here earlier this month complaining of cough, principally nonproductive, for several months.  She was treated with Augmentin, Mucinex DM, Delsym.  Chest x-ray from 10/9 did not show any infiltrate. She reports that the cough may have improved a bit, did not resolve. She is now on nexium qd, zyrtec, flonase. She is on Anoro, unclear that this is contributing to cough. Unclear trigger, no real relieving factors. No HA, no nasal discharge. Clears her throat. She can't tolerate prednisone.    Review of Systems As per HPI      Objective:   Physical Exam Vitals:   10/22/18 1500  BP: 106/70  Pulse: 82  SpO2: 93%  Weight: 142 lb (64.4 kg)  Height: 5' (1.524 m)   Gen: Pleasant, well-nourished, in no distress,  normal affect  ENT: No lesions,  mouth clear,  oropharynx clear, no postnasal drip  Neck: No JVD, no stridor  Lungs: No use of accessory muscles, distant, clear, no  wheeze  Cardiovascular: RRR, heart sounds normal, no murmur or gallops, no peripheral edema  Musculoskeletal: No deformities, no cyanosis or clubbing  Neuro: alert, non focal  Skin: Warm, no lesions or rashes      Assessment & Plan:  Chronic cough Probable contribution of allergic rhinitis and also GERD.  She feels that both of these are fairly well controlled on her current regimen.  I question whether the powdered formulation of the Anoro is a contributor.  I will try changing this to Stiolto to see if this relieves the cough.  If not then we may need to consider increasing the Nexium.  I do not see any evidence for chronic sinusitis.  She is not currently wheezing and I do not think a prednisone taper would be beneficial.  She cannot tolerate prednisone anyway due to depression and psychological disturbance.  COPD (chronic obstructive pulmonary disease) with emphysema (HCC) Trial changing Anoro to Stiolto as above  We will try temporarily changing Anoro to Stiolto 2 puffs once daily Keep your albuterol available to use 2 puffs up to every 4 hours if needed for shortness of breath, chest tightness, wheezing. Continue your oxygen as you are using it. Follow with Dr Delton Coombes in 1 month or next available to assess your cough on the new medication.  Levy Pupa, MD, PhD 10/22/2018, 3:33 PM [facility] [phone] or if no answer [phone]

## 2018-10-22 NOTE — Assessment & Plan Note (Signed)
Probable contribution of allergic rhinitis and also GERD.  She feels that both of these are fairly well controlled on her current regimen.  I question whether the powdered formulation of the Anoro is a contributor.  I will try changing this to Stiolto to see if this relieves the cough.  If not then we may need to consider increasing the Nexium.  I do not see any evidence for chronic sinusitis.  She is not currently wheezing and I do not think a prednisone taper would be beneficial.  She cannot tolerate prednisone anyway due to depression and psychological disturbance.

## 2018-10-22 NOTE — Assessment & Plan Note (Signed)
Trial changing Anoro to [employer] as above  We will try temporarily changing Anoro to Stiolto 2 puffs once daily Keep your albuterol available to use 2 puffs up to every 4 hours if needed for shortness of breath, chest tightness, wheezing. Continue your oxygen as you are using it. Follow with Dr Delton Coombes in 1 month or next available to assess your cough on the new medication.

## 2018-10-22 NOTE — Patient Instructions (Signed)
We will try temporarily changing Anoro to Stiolto 2 puffs once daily Keep your albuterol available to use 2 puffs up to every 4 hours if needed for shortness of breath, chest tightness, wheezing. Continue your Nexium 40 mg daily for now.  Depending on how your cough responds we may decide to increase at some point in the future. Continue Zyrtec and fluticasone nasal spray as you are taking them. Continue your oxygen as you are using it. Follow with Dr Delton Coombes in 1 month or next available to assess your cough on the new medication.

## 2018-10-25 ENCOUNTER — Telehealth: Payer: Self-pay | Admitting: Emergency Medicine

## 2018-10-25 NOTE — Telephone Encounter (Signed)
PA request received from [email or web address] for Stiolto CMM Key: [id number] PA request has been sent to plan, and a determination is expected within 3 days.   Routing to [place] for follow-up.

## 2018-10-26 NOTE — Telephone Encounter (Signed)
Medication name and strength: Stiolto Respimat PA approved/denied: Approved Approval dates: 12/25/2017 - 12/25/2018

## 2018-11-13 ENCOUNTER — Encounter: Payer: Self-pay | Admitting: Emergency Medicine

## 2018-11-13 ENCOUNTER — Ambulatory Visit (INDEPENDENT_AMBULATORY_CARE_PROVIDER_SITE_OTHER): Payer: Medicare Other | Admitting: Emergency Medicine

## 2018-11-13 DIAGNOSIS — K219 Gastro-esophageal reflux disease without esophagitis: Secondary | ICD-10-CM | POA: Diagnosis not present

## 2018-11-13 DIAGNOSIS — J432 Centrilobular emphysema: Secondary | ICD-10-CM | POA: Diagnosis not present

## 2018-11-13 DIAGNOSIS — R05 Cough: Secondary | ICD-10-CM

## 2018-11-13 DIAGNOSIS — R053 Chronic cough: Secondary | ICD-10-CM

## 2018-11-13 DIAGNOSIS — J310 Chronic rhinitis: Secondary | ICD-10-CM | POA: Diagnosis not present

## 2018-11-13 NOTE — Assessment & Plan Note (Signed)
She does not have overt GERD symptoms but I would like to ramp up her therapy for at least 2 weeks to see if this helps the cough.  Increase Nexium to twice daily for 14 days and then go back to daily

## 2018-11-13 NOTE — Assessment & Plan Note (Signed)
The Stiolto may have helped her breathing a bit and she felt that it may have cause less upper airway irritation but unfortunately she had side effects that included dizziness and blurred vision.  I think we need to stop it and go back to Anoro.  Continue her albuterol as needed

## 2018-11-13 NOTE — Progress Notes (Signed)
Subjective:    Patient ID: Taylor Burnett, female    DOB: [birth year], 67 y.o.   MRN: [id number]  HPI 67 yo woman, former smoker, has been followed by Dr Shelle Iron for COPD, Allergic rhinitis, cough, mild obstructive sleep apnea  ROV 07/20/18 --67 year old woman who follows up today for COPD (very severe obstruction on pulmonary function testing without a BD response), untreated obstructive sleep apnea, exertional hypoxemia.  She has nocturnal hypoxemia as well.  She reports that she was treated w abx in April for ? Bronchitis. She improved some, does continue to have daily, minimally productive. Can be triggered by smells, by humidity.  Currently managed on Anoro. She is on zyrtec, uses flonase but only prn. She does have frequent reflux. Rarely uses nexium. She should get the Prevnar in 2020. She uses her o2 reliably.   ROV 10/22/18 --67 year old woman with a history of COPD, very severe obstruction by PFT, untreated sleep apnea, hypoxemia, chronic cough in the setting of allergic rhinitis and GERD.  She was seen here earlier this month complaining of cough, principally nonproductive, for several months.  She was treated with Augmentin, Mucinex DM, Delsym.  Chest x-ray from 10/9 did not show any infiltrate. She reports that the cough may have improved a bit, did not resolve. She is now on nexium qd, zyrtec, flonase. She is on Anoro, unclear that this is contributing to cough. Unclear trigger, no real relieving factors. No HA, no nasal discharge. Clears her throat. She can't tolerate prednisone.   ROV 11/13/18 --this follow-up visit for 67 year old woman with untreated sleep apnea, COPD and severe obstruction based on spirometry, associated hypoxemia.  She also has chronic cough this been difficult to manage most recently.  At her last visit I continued her stable Nexium, Zyrtec, Flonase.  We tried changing her Anoro to Stiolto to see if the powdered formulation was contributing to her upper airway  irritation. She may have had less UA irritation although her cough did not resolve.  It may have helped her breathing also but she did have side effects.  She has had some blurred vision, some dizziness. Her cough is paroxysmal, remains non-productive. Can happen with certain scents, lots of talking. Denies any overt GERD, PND.    CXR 10/03/18 reviewed, no abnormalities.    Review of Systems As per HPI      Objective:   Physical Exam Vitals:   11/13/18 1536  BP: 110/80  Pulse: 95  SpO2: 95%  Weight: 145 lb (65.8 kg)  Height: 5' (1.524 m)   Gen: Pleasant, well-nourished, in no distress,  normal affect  ENT: No lesions,  mouth clear,  oropharynx clear, no postnasal drip  Neck: No JVD, no stridor  Lungs: No use of accessory muscles, distant, clear, no wheeze  Cardiovascular: RRR, heart sounds normal, no murmur or gallops, no peripheral edema  Musculoskeletal: No deformities, no cyanosis or clubbing  Neuro: alert, non focal  Skin: Warm, no lesions or rashes      Assessment & Plan:  COPD (chronic obstructive pulmonary disease) with emphysema (HCC) The Stiolto may have helped her breathing a bit and she felt that it may have cause less upper airway irritation but unfortunately she had side effects that included dizziness and blurred vision.  I think we need to stop it and go back to Anoro.  Continue her albuterol as needed  Chronic rhinitis Continue Zyrtec and Flonase as ordered  GERD (gastroesophageal reflux disease) She does not have overt GERD symptoms  but I would like to ramp up her therapy for at least 2 weeks to see if this helps the cough.  Increase Nexium to twice daily for 14 days and then go back to daily  Chronic cough The cough persists and appears to be paroxysmal.  It can be brought on by since or exposures.  Often associated with wheezing and dyspnea.  Attempting to treat allergic rhinitis and GERD aggressively.  If she continues to cough then I think she  needs bronchoscopy to visualize airways and upper airway.  Levy Pupa, MD, PhD 11/13/2018, 4:10 PM [facility] [phone] or if no answer [phone]

## 2018-11-13 NOTE — Assessment & Plan Note (Signed)
The cough persists and appears to be paroxysmal.  It can be brought on by since or exposures.  Often associated with wheezing and dyspnea.  Attempting to treat allergic rhinitis and GERD aggressively.  If she continues to cough then I think she needs bronchoscopy to visualize airways and upper airway.

## 2018-11-13 NOTE — Patient Instructions (Addendum)
Please stop Stiolto. We will go back to Anoro 1 inhalation once daily. Keep your albuterol available to use 2 puffs if needed for shortness of breath, wheezing, coughing. Please continue Zyrtec and Flonase nasal spray as you have been taking them. For the next 2 weeks please increase your Nexium to 40 mg twice a day.  Take this medication 1 hour before eating.  After 2 weeks you can go back to taking it once daily. If you continue to have cough then we will evaluate your airways by bronchoscopy. Follow with Dr Delton Coombes in 1 month

## 2018-11-13 NOTE — Assessment & Plan Note (Signed)
Continue Zyrtec and Flonase as ordered

## 2018-12-10 ENCOUNTER — Other Ambulatory Visit: Payer: Self-pay | Admitting: Emergency Medicine

## 2018-12-11 ENCOUNTER — Encounter: Payer: Self-pay | Admitting: Family Medicine

## 2018-12-11 ENCOUNTER — Ambulatory Visit
Admission: RE | Admit: 2018-12-11 | Discharge: 2018-12-11 | Disposition: A | Discharge status: Home / Self Care | Payer: Medicare Other | Source: Ambulatory Visit | Attending: Family Medicine | Admitting: Family Medicine

## 2018-12-11 ENCOUNTER — Ambulatory Visit (INDEPENDENT_AMBULATORY_CARE_PROVIDER_SITE_OTHER): Payer: Medicare Other | Admitting: Family Medicine

## 2018-12-11 VITALS — BP 126/78 | HR 80 | Temp 97.5°F | Resp 16 | Ht 63.0 in | Wt 146.0 lb

## 2018-12-11 DIAGNOSIS — J438 Other emphysema: Secondary | ICD-10-CM

## 2018-12-11 DIAGNOSIS — G609 Hereditary and idiopathic neuropathy, unspecified: Secondary | ICD-10-CM | POA: Diagnosis not present

## 2018-12-11 DIAGNOSIS — R059 Cough, unspecified: Secondary | ICD-10-CM

## 2018-12-11 DIAGNOSIS — R05 Cough: Secondary | ICD-10-CM

## 2018-12-11 MED ORDER — GABAPENTIN 100 MG PO CAPS: 100.0000 mg | ORAL_CAPSULE | Freq: Three times a day (TID) | ORAL | 3 refills | Status: DC

## 2018-12-11 NOTE — Progress Notes (Signed)
Subjective:    Patient ID: Taylor Burnett, female    DOB: Apr 30, 1951, 67 y.o.   MRN: [id number]  HPI 04/20/18 Patient is here today to establish care.  She is a 67 year old white female with a history of COPD which is oxygen dependent.  She also has a history of cervical spinal fusion.  She reports chronic insomnia and has tried and failed Ambien, Lunesta, amitriptyline, and hydroxyzine.  She also has a history of osteoporosis.  T score on a bone density test in 2015 was -3.6 in the spine.  She is not taking any calcium or vitamin D or bone building therapy.  She is overdue for mammogram.  She is overdue for repeat bone density.  She is overdue for colonoscopy.  She declines a colonoscopy because of her COPD.  She is overdue for Pneumovax 23 given her history.  She has very reticent to receive that vaccination.  I spent more than 10 minutes today discussing this with her and ultimately convinced her to receive Pneumovax 23.  She also reports a worsening cough over the last few days productive of green sputum.  On exam today, she has bibasilar crackles right greater than left.  She also reports increased sputum production and change in the character of the sputum.  At that time, my plan was: I am concerned the patient is having a COPD exacerbation and therefore I will treat her with a Z-Pak.  I convinced the patient to receive Pneumovax 23 today in clinic.  Patient refuses colonoscopy however I did discuss cologuard with her and she is willing to proceed with this.  I will have that information shipped to her home.  She declines a Pap smear at the present time.  Density was last checked in 2015.  It showed significant osteoporosis then which has been untreated.  Begin Fosamax 70 mg a week and repeat a bone density today to establish a baseline.  Repeat bone density again in 2 years to assess the response to therapy.  Schedule the patient for a mammogram.  Recommended trazodone for insomnia.  Avoid any  addictive medications.  Patient declines trazodone.  Patient also has a history of prediabetes.  I will check a hemoglobin A1c, fasting lipid panel, CBC, and CMP.  12/11/18 Patient presents today with several concerns.  First she reports burning and itching on the palms of both hands and on the soles of both feet.  It is a constant burning pain.  She also reports symptoms consistent with restless limb.  She states that it is very hard for her to sit still.  It causes an uncomfortable sensation in her extremities that is relieved with movement.  She does have some dry skin on the palms and soles of her feet and some mild athlete's foot on the medial aspect of her right arch however there is no significant rash.  Burning and itching and pain are out of proportion to what seen on exam raising concern for possible peripheral neuropathy.  She also reports a constant cough.  She states that the cough has not improved since I saw her last.  The cough is nonproductive.  She also reports cramp-like pains in the lower ribs posteriorly bilaterally.  It occurs usually after coughing.  On exam today, her lungs are clear.  There are no wheezes however she does have some faint fine crackles bilaterally.  She is currently on a Norco but she states that her breathing is worsening and that she is  having increasing shortness of breath and wheezing Past Medical History:  Diagnosis Date  . Arthritis   . Cataract   . COPD (chronic obstructive pulmonary disease) (HCC)   . Cough   . DDD (degenerative disc disease), cervical   . Dysrhythmia    RAPID HEART BEAT OCC   . Emphysema of lung (HCC)   . GERD (gastroesophageal reflux disease)    OCC  . H/O degenerative disc disease   . Headache   . Myocardial infarction (HCC) 2016  . Osteoporosis   . Panic attacks   . Pre-diabetes   . Severe lung disease   . Shortness of breath dyspnea   . Sleep apnea   . Spondylosis    Past Surgical History:  Procedure Laterality Date    . ANTERIOR CERVICAL DECOMP/DISCECTOMY FUSION N/A 03/13/2015   Procedure: CERVICAL THREE-CERVICAL FOUR, CERVICAL FOUR-CERVICAL FIVE, CERVICAL FIVE-CERVICAL SIX ANTERIOR CERVICAL DECOMPRESSION/DISCECTOMY FUSION 3 LEVELS;  Surgeon: Tia Alert, MD;  Location: [facility];  Service: Neurosurgery;  Laterality: N/A;  . BREAST ENHANCEMENT SURGERY  1990  . CARDIAC CATHETERIZATION  1995  . CATARACT EXTRACTION W/PHACO Right 05/02/2017   Procedure: CATARACT EXTRACTION PHACO AND INTRAOCULAR LENS PLACEMENT (IOC);  Surgeon: Jethro Bolus, MD;  Location: [facility];  Service: Ophthalmology;  Laterality: Right;  CDE: 6.22  . CATARACT EXTRACTION W/PHACO Left 05/16/2017   Procedure: CATARACT EXTRACTION PHACO AND INTRAOCULAR LENS PLACEMENT (IOC);  Surgeon: Jethro Bolus, MD;  Location: [facility];  Service: Ophthalmology;  Laterality: Left;  CDE: 4.10  . TONSILLECTOMY    . TUBAL LIGATION     Current Outpatient Medications on File Prior to Visit  Medication Sig Dispense Refill  . Ascorbic Acid (VITAMIN C) 1000 MG tablet Take 1,000 mg by mouth daily.    . cetirizine (ZYRTEC) 10 MG tablet Take 10 mg by mouth at bedtime.     . Cholecalciferol (VITAMIN D) 2000 units CAPS Take by mouth.    . esomeprazole (NEXIUM) 40 MG capsule Take 1 capsule (40 mg total) by mouth daily at 12 noon. 31 capsule 0  . fluticasone (FLONASE) 50 MCG/ACT nasal spray Place 2 sprays into both nostrils daily. 16 g 2  . naproxen sodium (ALEVE) 220 MG tablet Take 220 mg by mouth.    . OXYGEN Inhale 2 L into the lungs as needed. FOR USE WITH EXERTION    . PROAIR HFA 108 (90 Base) MCG/ACT inhaler INHALE 2 PUFFS INTO THE LUNGS EVERY 6 HOURS AS NEEDED. 1 Inhaler 5  . umeclidinium-vilanterol (ANORO ELLIPTA) 62.5-25 MCG/INH AEPB Inhale 1 puff into the lungs daily.    . vitamin B-12 (CYANOCOBALAMIN) 100 MCG tablet Take 100 mcg by mouth daily.    Marland Kitchen VITAMIN E PO Take by mouth.     No current facility-administered medications on file prior to visit.     Allergies  Allergen Reactions  . Prednisone Other (See Comments)    Depression and suicidal   Social History   Socioeconomic History  . Marital status: Widowed    Spouse name: Not on file  . Number of children: Not on file  . Years of education: Not on file  . Highest education level: Not on file  Occupational History  . Occupation: on disability.  Social Needs  . Financial resource strain: Not on file  . Food insecurity:    Worry: Not on file    Inability: Not on file  . Transportation needs:    Medical: Not on file    Non-medical:  Not on file  Tobacco Use  . Smoking status: Former Smoker    Packs/day: 2.00    Years: 40.00    Pack years: 80.00    Types: Cigarettes    Last attempt to quit: 07/31/2013    Years since quitting: 5.3  . Smokeless tobacco: Never Used  Substance and Sexual Activity  . Alcohol use: No    Comment: OCC WINE   . Drug use: No  . Sexual activity: Not on file  Lifestyle  . Physical activity:    Days per week: Not on file    Minutes per session: Not on file  . Stress: Not on file  Relationships  . Social connections:    Talks on phone: Not on file    Gets together: Not on file    Attends religious service: Not on file    Active member of club or organization: Not on file    Attends meetings of clubs or organizations: Not on file    Relationship status: Not on file  . Intimate partner violence:    Fear of current or ex partner: Not on file    Emotionally abused: Not on file    Physically abused: Not on file    Forced sexual activity: Not on file  Other Topics Concern  . Not on file  Social History Narrative   Divorced and lives alone.       Review of Systems  All other systems reviewed and are negative.      Objective:   Physical Exam Vitals signs reviewed.  Constitutional:      General: She is not in acute distress.    Appearance: She is well-developed. She is not diaphoretic.  HENT:     Head: Normocephalic and  atraumatic.     Nose: Nose normal.  Eyes:     Conjunctiva/sclera: Conjunctivae normal.     Pupils: Pupils are equal, round, and reactive to light.  Neck:     Musculoskeletal: Neck supple.     Vascular: No JVD.  Cardiovascular:     Rate and Rhythm: Normal rate and regular rhythm.     Heart sounds: Normal heart sounds. No murmur. No friction rub. No gallop.   Pulmonary:     Effort: Pulmonary effort is normal.     Breath sounds: Rales present. No wheezing or rhonchi.  Abdominal:     Palpations: Abdomen is soft.           Assessment & Plan:  Cough - Plan: DG Chest 2 View  Idiopathic peripheral neuropathy - Plan: CBC with Differential/Platelet, COMPLETE METABOLIC PANEL WITH GFR, Vitamin B12, TSH  Other emphysema (HCC)  I believe the burning and itching in her hands and feet is a sign of underlying peripheral neuropathy.  I have recommended starting gabapentin 100 mg p.o. 3 times daily as needed.  Meanwhile check CBC, CMP, and TSH.  Reassess in 1 month and uptitrate gabapentin if effective.  I believe her cough is likely due to her underlying COPD/emphysema.  I have recommended obtaining a chest x-ray given the fine crackles heard on exam to rule out other potential causes and changing her Norco to Trelegy 1 inhalation a day to see if her symptoms will improve.  Her documented allergy to prednisone is altered mental status however this was to high-dose prednisone given during an exacerbation.  I believe that the low-dose containing Trelegy would be safe.  Patient is willing to try it.

## 2018-12-12 LAB — CBC WITH DIFFERENTIAL/PLATELET
Absolute Monocytes: 440 cells/uL (ref 200–950)
BASOS PCT: 0.7 %
Basophils Absolute: 50 cells/uL (ref 0–200)
Eosinophils Absolute: 284 cells/uL (ref 15–500)
Eosinophils Relative: 4 %
HCT: 45 % (ref 35.0–45.0)
Hemoglobin: 14.7 g/dL (ref 11.7–15.5)
Lymphs Abs: 1669 cells/uL (ref 850–3900)
MCH: 30.2 pg (ref 27.0–33.0)
MCHC: 32.7 g/dL (ref 32.0–36.0)
MCV: 92.6 fL (ref 80.0–100.0)
MONOS PCT: 6.2 %
MPV: 9.5 fL (ref 7.5–12.5)
Neutro Abs: 4658 cells/uL (ref 1500–7800)
Neutrophils Relative %: 65.6 %
Platelets: 292 10*3/uL (ref 140–400)
RBC: 4.86 10*6/uL (ref 3.80–5.10)
RDW: 12.4 % (ref 11.0–15.0)
Total Lymphocyte: 23.5 %
WBC: 7.1 10*3/uL (ref 3.8–10.8)

## 2018-12-12 LAB — COMPLETE METABOLIC PANEL WITH GFR
AG Ratio: 2 (calc) (ref 1.0–2.5)
ALT: 12 U/L (ref 6–29)
AST: 19 U/L (ref 10–35)
Albumin: 4.7 g/dL (ref 3.6–5.1)
Alkaline phosphatase (APISO): 49 U/L (ref 33–130)
BUN: 17 mg/dL (ref 7–25)
CO2: 27 mmol/L (ref 20–32)
Calcium: 10.4 mg/dL (ref 8.6–10.4)
Chloride: 101 mmol/L (ref 98–110)
Creat: 0.88 mg/dL (ref 0.50–0.99)
GFR, EST NON AFRICAN AMERICAN: 68 mL/min/{1.73_m2} (ref >=60)
GFR, Est African American: 79 mL/min/{1.73_m2} (ref >=60)
GLUCOSE: 99 mg/dL (ref 65–99)
Globulin: 2.3 g/dL (calc) (ref 1.9–3.7)
Potassium: 5.7 mmol/L — ABNORMAL HIGH (ref 3.5–5.3)
Sodium: 142 mmol/L (ref 135–146)
Total Bilirubin: 0.3 mg/dL (ref 0.2–1.2)
Total Protein: 7 g/dL (ref 6.1–8.1)

## 2018-12-12 LAB — TSH: TSH: 1.25 mIU/L (ref 0.40–4.50)

## 2018-12-12 LAB — VITAMIN B12: Vitamin B-12: 620 pg/mL (ref 200–1100)

## 2018-12-21 ENCOUNTER — Other Ambulatory Visit: Payer: Self-pay | Admitting: Family Medicine

## 2018-12-21 DIAGNOSIS — E875 Hyperkalemia: Secondary | ICD-10-CM

## 2018-12-24 ENCOUNTER — Other Ambulatory Visit: Payer: Medicare Other

## 2018-12-24 ENCOUNTER — Encounter: Payer: Self-pay | Admitting: Emergency Medicine

## 2018-12-24 ENCOUNTER — Ambulatory Visit (INDEPENDENT_AMBULATORY_CARE_PROVIDER_SITE_OTHER): Payer: Medicare Other | Admitting: Emergency Medicine

## 2018-12-24 DIAGNOSIS — J310 Chronic rhinitis: Secondary | ICD-10-CM

## 2018-12-24 DIAGNOSIS — R05 Cough: Secondary | ICD-10-CM

## 2018-12-24 DIAGNOSIS — J438 Other emphysema: Secondary | ICD-10-CM

## 2018-12-24 DIAGNOSIS — R053 Chronic cough: Secondary | ICD-10-CM

## 2018-12-24 DIAGNOSIS — K219 Gastro-esophageal reflux disease without esophagitis: Secondary | ICD-10-CM

## 2018-12-24 NOTE — H&P (View-Only) (Signed)
Subjective:    Patient ID: Taylor Burnett, female    DOB: 1951/05/03, 67 y.o.   MRN: [id number]  HPI 67 yo woman, former smoker, has been followed by Dr Shelle Iron for COPD, Allergic rhinitis, cough, mild obstructive sleep apnea  ROV 07/20/18 --67 year old woman who follows up today for COPD (very severe obstruction on pulmonary function testing without a BD response), untreated obstructive sleep apnea, exertional hypoxemia.  She has nocturnal hypoxemia as well.  She reports that she was treated w abx in April for ? Bronchitis. She improved some, does continue to have daily, minimally productive. Can be triggered by smells, by humidity.  Currently managed on Anoro. She is on zyrtec, uses flonase but only prn. She does have frequent reflux. Rarely uses nexium. She should get the Prevnar in 2020. She uses her o2 reliably.   ROV 10/22/18 --67 year old woman with a history of COPD, very severe obstruction by PFT, untreated sleep apnea, hypoxemia, chronic cough in the setting of allergic rhinitis and GERD.  She was seen here earlier this month complaining of cough, principally nonproductive, for several months.  She was treated with Augmentin, Mucinex DM, Delsym.  Chest x-ray from 10/9 did not show any infiltrate. She reports that the cough may have improved a bit, did not resolve. She is now on nexium qd, zyrtec, flonase. She is on Anoro, unclear that this is contributing to cough. Unclear trigger, no real relieving factors. No HA, no nasal discharge. Clears her throat. She can't tolerate prednisone.   ROV 11/13/18 --this follow-up visit for 67 year old woman with untreated sleep apnea, COPD and severe obstruction based on spirometry, associated hypoxemia.  She also has chronic cough this been difficult to manage most recently.  At her last visit I continued her stable Nexium, Zyrtec, Flonase.  We tried changing her Anoro to Stiolto to see if the powdered formulation was contributing to her upper airway  irritation. She may have had less UA irritation although her cough did not resolve.  It may have helped her breathing also but she did have side effects.  She has had some blurred vision, some dizziness. Her cough is paroxysmal, remains non-productive. Can happen with certain scents, lots of talking. Denies any overt GERD, PND.    CXR 10/03/18 reviewed, no abnormalities.   ROV 12/24/18 --this is a follow-up visit for 67 year old woman with severe COPD and associated hypoxemic respiratory failure, untreated sleep apnea, chronic cough.  This is been difficult to manage.  I tried changing her Anoro to [employer] but she did not tolerate, went back to Anoro last visit.  She is on Zyrtec and Flonase.  I increased her Nexium to twice a day last visit to see if she would benefit - no real change in her globus sensation or cough.  Chest x-ray 12/11/2018 was reviewed, showed mild hyperinflation with some bronchovascular prominence superiorly, unchanged.  No infiltrates. She recently saw Dr Tanya Nones and started a trial of Trelegy - she may be benefiting, better breathing. She is back to nexium qd. Still on flonase, zyrtec.    Review of Systems As per HPI      Objective:   Physical Exam Vitals:   12/24/18 1549  BP: 118/76  Pulse: 77  SpO2: 95%  Weight: 142 lb 3.2 oz (64.5 kg)  Height: 5\' 3"  (1.6 m)   Gen: Pleasant, well-nourished, in no distress,  normal affect  ENT: No lesions,  mouth clear,  oropharynx clear, no postnasal drip  Neck: No JVD, no stridor  Lungs: No use of accessory muscles, distant, clear, no wheeze  Cardiovascular: RRR, heart sounds normal, no murmur or gallops, no peripheral edema  Musculoskeletal: No deformities, no cyanosis or clubbing  Neuro: alert, non focal  Skin: Warm, no lesions or rashes      Assessment & Plan:  COPD (chronic obstructive pulmonary disease) with emphysema (HCC) Please continue Trelegy once daily until your samples run out.  Remember to rinse and  gargle after using.  Call our office to let us know if you have benefited, if so then we will order through your pharmacy (as a substitute for Anoro). Keep your albuterol available to use 2 puffs if needed for shortness of breath, chest tightness, wheezing.  Chronic rhinitis Continue Zyrtec, fluticasone nasal spray as you have been using it.  GERD (gastroesophageal reflux disease) Increased therapy empirically, no real change in her cough.  Continue Nexium 40 mg once daily  Chronic cough Please continue Nexium 40 mg once daily.  Take this medication 1 hour before eating. Continue Zyrtec, fluticasone nasal spray as you have been using it. We will arrange for bronchoscopy to evaluate further for cough. Follow with Dr Delton Coombes in 1 month  Levy Pupaobert , MD, PhD 12/24/2018, 4:08 PM [facility] [phone] or if no answer [phone]

## 2018-12-24 NOTE — Progress Notes (Signed)
Subjective:    Patient ID: Taylor Burnett, female    DOB: 1951/05/03, 67 y.o.   MRN: [id number]  HPI 67 yo woman, former smoker, has been followed by Dr Shelle Iron for COPD, Allergic rhinitis, cough, mild obstructive sleep apnea  ROV 07/20/18 --67 year old woman who follows up today for COPD (very severe obstruction on pulmonary function testing without a BD response), untreated obstructive sleep apnea, exertional hypoxemia.  She has nocturnal hypoxemia as well.  She reports that she was treated w abx in April for ? Bronchitis. She improved some, does continue to have daily, minimally productive. Can be triggered by smells, by humidity.  Currently managed on Anoro. She is on zyrtec, uses flonase but only prn. She does have frequent reflux. Rarely uses nexium. She should get the Prevnar in 2020. She uses her o2 reliably.   ROV 10/22/18 --67 year old woman with a history of COPD, very severe obstruction by PFT, untreated sleep apnea, hypoxemia, chronic cough in the setting of allergic rhinitis and GERD.  She was seen here earlier this month complaining of cough, principally nonproductive, for several months.  She was treated with Augmentin, Mucinex DM, Delsym.  Chest x-ray from 10/9 did not show any infiltrate. She reports that the cough may have improved a bit, did not resolve. She is now on nexium qd, zyrtec, flonase. She is on Anoro, unclear that this is contributing to cough. Unclear trigger, no real relieving factors. No HA, no nasal discharge. Clears her throat. She can't tolerate prednisone.   ROV 11/13/18 --this follow-up visit for 67 year old woman with untreated sleep apnea, COPD and severe obstruction based on spirometry, associated hypoxemia.  She also has chronic cough this been difficult to manage most recently.  At her last visit I continued her stable Nexium, Zyrtec, Flonase.  We tried changing her Anoro to Stiolto to see if the powdered formulation was contributing to her upper airway  irritation. She may have had less UA irritation although her cough did not resolve.  It may have helped her breathing also but she did have side effects.  She has had some blurred vision, some dizziness. Her cough is paroxysmal, remains non-productive. Can happen with certain scents, lots of talking. Denies any overt GERD, PND.    CXR 10/03/18 reviewed, no abnormalities.   ROV 12/24/18 --this is a follow-up visit for 67 year old woman with severe COPD and associated hypoxemic respiratory failure, untreated sleep apnea, chronic cough.  This is been difficult to manage.  I tried changing her Anoro to [employer] but she did not tolerate, went back to Anoro last visit.  She is on Zyrtec and Flonase.  I increased her Nexium to twice a day last visit to see if she would benefit - no real change in her globus sensation or cough.  Chest x-ray 12/11/2018 was reviewed, showed mild hyperinflation with some bronchovascular prominence superiorly, unchanged.  No infiltrates. She recently saw Dr Tanya Nones and started a trial of Trelegy - she may be benefiting, better breathing. She is back to nexium qd. Still on flonase, zyrtec.    Review of Systems As per HPI      Objective:   Physical Exam Vitals:   12/24/18 1549  BP: 118/76  Pulse: 77  SpO2: 95%  Weight: 142 lb 3.2 oz (64.5 kg)  Height: 5\' 3"  (1.6 m)   Gen: Pleasant, well-nourished, in no distress,  normal affect  ENT: No lesions,  mouth clear,  oropharynx clear, no postnasal drip  Neck: No JVD, no stridor  Lungs: No use of accessory muscles, distant, clear, no wheeze  Cardiovascular: RRR, heart sounds normal, no murmur or gallops, no peripheral edema  Musculoskeletal: No deformities, no cyanosis or clubbing  Neuro: alert, non focal  Skin: Warm, no lesions or rashes      Assessment & Plan:  COPD (chronic obstructive pulmonary disease) with emphysema (HCC) Please continue Trelegy once daily until your samples run out.  Remember to rinse and  gargle after using.  Call our office to let us know if you have benefited, if so then we will order through your pharmacy (as a substitute for Anoro). Keep your albuterol available to use 2 puffs if needed for shortness of breath, chest tightness, wheezing.  Chronic rhinitis Continue Zyrtec, fluticasone nasal spray as you have been using it.  GERD (gastroesophageal reflux disease) Increased therapy empirically, no real change in her cough.  Continue Nexium 40 mg once daily  Chronic cough Please continue Nexium 40 mg once daily.  Take this medication 1 hour before eating. Continue Zyrtec, fluticasone nasal spray as you have been using it. We will arrange for bronchoscopy to evaluate further for cough. Follow with Dr  in 1 month   , MD, PhD 12/24/2018, 4:08 PM [facility] [phone] or if no answer [phone]  

## 2018-12-24 NOTE — Assessment & Plan Note (Signed)
Increased therapy empirically, no real change in her cough.  Continue Nexium 40 mg once daily

## 2018-12-24 NOTE — Patient Instructions (Addendum)
Please continue Trelegy once daily until your samples run out.  Remember to rinse and gargle after using.  Call our office to let us know if you have benefited, if so then we will order through your pharmacy (as a substitute for Anoro). Keep your albuterol available to use 2 puffs if needed for shortness of breath, chest tightness, wheezing. Please continue Nexium 40 mg once daily.  Take this medication 1 hour before eating. Continue Zyrtec, fluticasone nasal spray as you have been using it. We will arrange for bronchoscopy to evaluate further for cough. Follow with Dr Delton Coombes in 1 month

## 2018-12-24 NOTE — Assessment & Plan Note (Signed)
Please continue Nexium 40 mg once daily.  Take this medication 1 hour before eating. Continue Zyrtec, fluticasone nasal spray as you have been using it. We will arrange for bronchoscopy to evaluate further for cough. Follow with Dr Delton Coombes in 1 month

## 2018-12-24 NOTE — Assessment & Plan Note (Signed)
Continue Zyrtec, fluticasone nasal spray as you have been using it.

## 2018-12-24 NOTE — Assessment & Plan Note (Signed)
Please continue Trelegy once daily until your samples run out.  Remember to rinse and gargle after using.  Call our office to let us know if you have benefited, if so then we will order through your pharmacy (as a substitute for Anoro). Keep your albuterol available to use 2 puffs if needed for shortness of breath, chest tightness, wheezing.

## 2018-12-27 ENCOUNTER — Other Ambulatory Visit: Payer: Medicare Other

## 2018-12-27 DIAGNOSIS — E875 Hyperkalemia: Secondary | ICD-10-CM

## 2018-12-28 LAB — BASIC METABOLIC PANEL
BUN: 17 mg/dL (ref 7–25)
CO2: 32 mmol/L (ref 20–32)
Calcium: 9.6 mg/dL (ref 8.6–10.4)
Chloride: 102 mmol/L (ref 98–110)
Creat: 0.82 mg/dL (ref 0.50–0.99)
Glucose, Bld: 85 mg/dL (ref 65–99)
Potassium: 4.3 mmol/L (ref 3.5–5.3)
Sodium: 140 mmol/L (ref 135–146)

## 2018-12-31 ENCOUNTER — Encounter (HOSPITAL_COMMUNITY)
Admission: RE | Disposition: A | Discharge status: Home / Self Care | Payer: Self-pay | Source: Home / Self Care | Attending: Emergency Medicine

## 2018-12-31 ENCOUNTER — Ambulatory Visit (HOSPITAL_COMMUNITY)
Admission: RE | Admit: 2018-12-31 | Discharge: 2018-12-31 | Disposition: A | Discharge status: Home / Self Care | Payer: Medicare Other | Source: Ambulatory Visit | Attending: Emergency Medicine | Admitting: Emergency Medicine

## 2018-12-31 ENCOUNTER — Ambulatory Visit (HOSPITAL_COMMUNITY)
Admission: RE | Admit: 2018-12-31 | Discharge: 2018-12-31 | Disposition: A | Discharge status: Home / Self Care | Payer: Medicare Other | Attending: Emergency Medicine | Admitting: Emergency Medicine

## 2018-12-31 ENCOUNTER — Encounter (HOSPITAL_COMMUNITY): Payer: Self-pay | Admitting: Respiratory Therapy

## 2018-12-31 DIAGNOSIS — Z87891 Personal history of nicotine dependence: Secondary | ICD-10-CM | POA: Diagnosis not present

## 2018-12-31 DIAGNOSIS — K219 Gastro-esophageal reflux disease without esophagitis: Secondary | ICD-10-CM | POA: Insufficient documentation

## 2018-12-31 DIAGNOSIS — J449 Chronic obstructive pulmonary disease, unspecified: Secondary | ICD-10-CM | POA: Diagnosis not present

## 2018-12-31 DIAGNOSIS — R05 Cough: Secondary | ICD-10-CM

## 2018-12-31 DIAGNOSIS — R053 Chronic cough: Secondary | ICD-10-CM

## 2018-12-31 DIAGNOSIS — R846 Abnormal cytological findings in specimens from respiratory organs and thorax: Secondary | ICD-10-CM | POA: Diagnosis not present

## 2018-12-31 DIAGNOSIS — J310 Chronic rhinitis: Secondary | ICD-10-CM | POA: Insufficient documentation

## 2018-12-31 DIAGNOSIS — G4733 Obstructive sleep apnea (adult) (pediatric): Secondary | ICD-10-CM | POA: Insufficient documentation

## 2018-12-31 HISTORY — PX: VIDEO BRONCHOSCOPY: SHX5072

## 2018-12-31 LAB — BODY FLUID CELL COUNT WITH DIFFERENTIAL
Eos, Fluid: 2 %
LYMPHS FL: 2 %
MONOCYTE-MACROPHAGE-SEROUS FLUID: 10 % — AB (ref 50–90)
Neutrophil Count, Fluid: 86 % — ABNORMAL HIGH (ref 0–25)
Total Nucleated Cell Count, Fluid: 1290 cu mm — ABNORMAL HIGH (ref 0–1000)

## 2018-12-31 SURGERY — VIDEO BRONCHOSCOPY WITHOUT FLUORO
Anesthesia: Moderate Sedation | Laterality: Bilateral

## 2018-12-31 MED ORDER — SODIUM CHLORIDE 0.9 % IV SOLN
INTRAVENOUS | Status: DC
  Administered 2018-12-31: 07:00:00 via INTRAVENOUS

## 2018-12-31 MED ORDER — LIDOCAINE HCL URETHRAL/MUCOSAL 2 % EX GEL: 1.0000 "application " | Freq: Once | CUTANEOUS | Status: DC

## 2018-12-31 MED ORDER — PHENYLEPHRINE HCL 0.25 % NA SOLN
NASAL | Status: DC | PRN
  Administered 2018-12-31: 2 via NASAL

## 2018-12-31 MED ORDER — LIDOCAINE HCL URETHRAL/MUCOSAL 2 % EX GEL
CUTANEOUS | Status: DC | PRN
  Administered 2018-12-31: 1

## 2018-12-31 MED ORDER — MIDAZOLAM HCL (PF) 10 MG/2ML IJ SOLN
INTRAMUSCULAR | Status: DC | PRN
  Administered 2018-12-31 (×2): 2 mg via INTRAVENOUS

## 2018-12-31 MED ORDER — LIDOCAINE HCL 2 % EX GEL: 1.0000 "application " | Freq: Once | CUTANEOUS | Status: DC

## 2018-12-31 MED ORDER — LIDOCAINE HCL 1 % IJ SOLN
INTRAMUSCULAR | Status: DC | PRN
  Administered 2018-12-31: 6 mL via RESPIRATORY_TRACT

## 2018-12-31 MED ORDER — GABAPENTIN 100 MG PO CAPS: 100.0000 mg | ORAL_CAPSULE | Freq: Every day | ORAL | Status: DC

## 2018-12-31 MED ORDER — MIDAZOLAM HCL (PF) 5 MG/ML IJ SOLN
INTRAMUSCULAR | Status: AC
  Filled 2018-12-31: qty 2

## 2018-12-31 MED ORDER — FENTANYL CITRATE (PF) 100 MCG/2ML IJ SOLN
INTRAMUSCULAR | Status: AC
  Filled 2018-12-31: qty 4

## 2018-12-31 MED ORDER — FENTANYL CITRATE (PF) 100 MCG/2ML IJ SOLN
INTRAMUSCULAR | Status: DC | PRN
  Administered 2018-12-31 (×2): 50 ug via INTRAVENOUS

## 2018-12-31 MED ORDER — PHENYLEPHRINE HCL 0.25 % NA SOLN: 1.0000 | Freq: Four times a day (QID) | NASAL | Status: DC | PRN

## 2018-12-31 NOTE — Discharge Instructions (Signed)
Flexible Bronchoscopy, Care After This sheet gives you information about how to care for yourself after your test. Your doctor may also give you more specific instructions. If you have problems or questions, contact your doctor. Follow these instructions at home: Eating and drinking  Do not eat or drink anything (not even water) for 2 hours after your test, or until your numbing medicine (local anesthetic) wears off.  When your numbness is gone and your cough and gag reflexes have come back, you may: ? Eat only soft foods. ? Slowly drink liquids.  The day after the test, go back to your normal diet. Driving  Do not drive for 24 hours if you were given a medicine to help you relax (sedative).  Do not drive or use heavy machinery while taking prescription pain medicine. General instructions   Take over-the-counter and prescription medicines only as told by your doctor.  Return to your normal activities as told. Ask what activities are safe for you.  Do not use any products that have nicotine or tobacco in them. This includes cigarettes and e-cigarettes. If you need help quitting, ask your doctor.  Keep all follow-up visits as told by your doctor. This is important. It is very important if you had a tissue sample (biopsy) taken. Get help right away if:  You have shortness of breath that gets worse.  You get light-headed.  You feel like you are going to pass out (faint).  You have chest pain.  You cough up: ? More than a little blood. ? More blood than before. Summary  Do not eat or drink anything (not even water) for 2 hours after your test, or until your numbing medicine wears off.  Do not use cigarettes. Do not use e-cigarettes.  Get help right away if you have chest pain. This information is not intended to replace advice given to you by your health care provider. Make sure you discuss any questions you have with your health care provider. Document Released: 10/09/2009  Document Revised: 12/30/2016 Document Reviewed: 12/30/2016 Elsevier Interactive Patient Education  2019 [employer].  Nothing to eat or drink until  10:15  am today   12/31/2018       Any questions or concerns please call the office at [phone]

## 2018-12-31 NOTE — Progress Notes (Signed)
Video Bronchoscopy done Intervention Bronchial washing done Procedure tolerated well 

## 2018-12-31 NOTE — Interval H&P Note (Signed)
PCCM Interval Note  Pt presents today for further eval of her cough. Reports that the cough if fairly stable, may be less than last visit. Non-productive.   No new issues reported, no new meds. Remains on Trelegy, PPI, allergy regimen.   Vitals:   12/31/18 0638  BP: 126/71  Resp: 16  Temp: 97.7 F (36.5 C)  TempSrc: Oral  SpO2: 94%  Gen: Pleasant, well-nourished, in no distress,  normal affect  ENT: No lesions,  mouth clear,  oropharynx clear, no postnasal drip  Neck: No JVD, no stridor  Lungs: No use of accessory muscles, no crackles or wheezing on normal respiration, no wheeze on forced expiration  Cardiovascular: RRR, heart sounds normal, no murmur or gallops, no peripheral edema  Musculoskeletal: No deformities, no cyanosis or clubbing  Neuro: alert, awake, non focal  Skin: Warm, no lesions or rash  A/P: Chronic cough, contributions of PND, GERD.  Plan inspection FOB to assess for anatomical cause, obtain BAL for cx data, cell counts  Baltazar Apo, MD, PhD 12/31/2018, 7:08 AM [facility] [phone] or if no answer [phone]

## 2018-12-31 NOTE — Op Note (Signed)
[facility] Cardiopulmonary Patient Name: Taylor Burnett Procedure Date: 12/31/2018 MRN: [id number] Attending MD: Collene Gobble , MD Date of Birth: 1951-06-02 CSN: [id number] Age: 68 Admit Type: Outpatient Ethnicity: Not Hispanic or Latino Procedure:            Bronchoscopy Indications:          Chronic cough with normal chest X-ray Providers:            Collene Gobble, MD, Andre Lefort RRT,RCP, Ashley Mariner RRT,RCP Referring MD:          Medicines:            Midazolam 4 mg IV, Fentanyl 100 mcg IV, Lidocaine 1%                        applied to cords 8 mL Complications:        Oversedation, with transient desaturation. Additional                        O2 was placed, jaw thrust maneuver with quick recovery.                        No bag-mask ventilation or reversal agents were                        required. Estimated Blood Loss: Estimated blood loss: none. Procedure:      Pre-Anesthesia Assessment:      - A History and Physical has been performed. Patient meds and allergies       have been reviewed. The risks and benefits of the procedure and the       sedation options and risks were discussed with the patient. All       questions were answered and informed consent was obtained. Patient       identification and proposed procedure were verified prior to the       procedure by the physician in the procedure room. Mental Status       Examination: normal. Airway Examination: normal oropharyngeal airway.       Respiratory Examination: clear to auscultation. CV Examination: normal.       ASA Grade Assessment: I - A normal healthy patient. After reviewing the       risks and benefits, the patient was deemed in satisfactory condition to       undergo the procedure. The anesthesia plan was to use moderate sedation       / analgesia (conscious sedation). Immediately prior to administration of       medications, the patient was re-assessed for  adequacy to receive       sedatives. The heart rate, respiratory rate, oxygen saturations, blood       pressure, adequacy of pulmonary ventilation, and response to care were       monitored throughout the procedure. The physical status of the patient       was re-assessed after the procedure.      After obtaining informed consent, the bronchoscope was passed under       direct vision. Throughout the procedure, the patient's blood pressure,       pulse, and oxygen saturations were monitored continuously. the BF-H190       (  [id number]) Olympus Bronchoscope was introduced through the right nostril       and advanced to the tracheobronchial tree. The procedure was       accomplished without difficulty. The patient tolerated the procedure       well. The total duration of the procedure was 1 hour and 15 minutes. Findings:      The nasopharynx/oropharynx appears normal. The larynx appears normal.       The vocal cords appear normal. The subglottic space is normal. The       trachea is of normal caliber. The carina is sharp. The tracheobronchial       tree was examined to at least the first subsegmental level. Bronchial       mucosa and anatomy are normal; there are no endobronchial lesions, and       no secretions.      Bronchoalveolar lavage was performed in the RUL anterior segment (B3) of       the lung and sent for cell count, bacterial culture, viral smears &       culture, and fungal & AFB analysis and cytology. 60 mL of fluid were       instilled. 14 mL were returned. The return was clear. There were no       mucoid plugs in the return fluid. Impression:      - Chronic cough with normal chest X-ray      - The airway examination was normal.      - Bronchoalveolar lavage was performed. Moderate Sedation:      Moderate (conscious) sedation was personally administered by the       pulmonologist. The following parameters were monitored: oxygen       saturation, heart rate, blood pressure,  respiratory rate, EKG, adequacy       of pulmonary ventilation, and response to care. Total physician       intraservice time was 17 minutes. Recommendation:      - Await BAL, culture and cytology results. Procedure Code(s):      --- Professional ---      [id number], Bronchoscopy, rigid or flexible, including fluoroscopic guidance,       when performed; with bronchial alveolar lavage      99152, Moderate sedation services provided by the same physician or       other qualified health care professional performing the diagnostic or       therapeutic service that the sedation supports, requiring the presence       of an independent trained observer to assist in the monitoring of the       patient's level of consciousness and physiological status; initial 15       minutes of intraservice time, patient age 49 years or older Diagnosis Code(s):      --- Professional ---      R05, Cough CPT copyright 2018 American Medical Association. All rights reserved. The codes documented in this report are preliminary and upon coder review may  be revised to meet current compliance requirements. Collene Gobble, MD Collene Gobble, MD 12/31/2018 7:34:56 AM Number of Addenda: 0 Scope In: 7:11:17 AM Scope Out: 8:11:91 AM

## 2019-01-01 LAB — ACID FAST SMEAR (AFB, MYCOBACTERIA): Acid Fast Smear: NEGATIVE

## 2019-01-02 ENCOUNTER — Encounter (HOSPITAL_COMMUNITY): Payer: Self-pay | Admitting: Emergency Medicine

## 2019-01-02 LAB — CULTURE, BAL-QUANTITATIVE W GRAM STAIN
Culture: NORMAL
Special Requests: NORMAL

## 2019-01-17 ENCOUNTER — Telehealth: Payer: Self-pay | Admitting: Emergency Medicine

## 2019-01-17 NOTE — Telephone Encounter (Signed)
Called and spoke with Patient.  She stated that she wanted to schedule appointment with RB, for her ongoing cough and feeling increased SHOB. Appointment scheduled with RB 01/18/19, at 4pm.  Nothing further at this time.

## 2019-01-18 ENCOUNTER — Ambulatory Visit (INDEPENDENT_AMBULATORY_CARE_PROVIDER_SITE_OTHER): Payer: Medicare Other | Admitting: Emergency Medicine

## 2019-01-18 ENCOUNTER — Encounter: Payer: Self-pay | Admitting: Emergency Medicine

## 2019-01-18 DIAGNOSIS — J438 Other emphysema: Secondary | ICD-10-CM

## 2019-01-18 DIAGNOSIS — R05 Cough: Secondary | ICD-10-CM

## 2019-01-18 DIAGNOSIS — R053 Chronic cough: Secondary | ICD-10-CM

## 2019-01-18 MED ORDER — BUDESONIDE-FORMOTEROL FUMARATE 160-4.5 MCG/ACT IN AERO: 2.0000 | INHALATION_SPRAY | Freq: Two times a day (BID) | RESPIRATORY_TRACT | 0 refills | Status: DC

## 2019-01-18 MED ORDER — IPRATROPIUM BROMIDE 0.02 % IN SOLN: 0.5000 mg | Freq: Four times a day (QID) | RESPIRATORY_TRACT | 12 refills | Status: DC

## 2019-01-18 MED ORDER — IPRATROPIUM BROMIDE 0.02 % IN SOLN: 0.5000 mg | Freq: Three times a day (TID) | RESPIRATORY_TRACT | 12 refills | Status: DC

## 2019-01-18 MED ORDER — ARFORMOTEROL TARTRATE 15 MCG/2ML IN NEBU: 15.0000 ug | INHALATION_SOLUTION | Freq: Two times a day (BID) | RESPIRATORY_TRACT | 0 refills | Status: DC

## 2019-01-18 NOTE — Assessment & Plan Note (Signed)
Severe obstruction. Still with significant dyspnea. She is on Anoro, didn't like the Trelegy.  Will try changing her over to neb therapy, ? Whether she is getting adequate delivery of her Anoro.   We will stop Anoro for now Please start using scheduled nebulized medication as follows: - Brovana + budesonide (Pulmicort) twice a day - ipratropium (Atrovent) three times a day.  Keep your albuterol available to use 2 puffs up to every 4 hours if needed for shortness of breath, wheeze, chest tightness Walking oximetry on room air today. You will need to continue to use oxygen at 2L/min with exertion Please follow with APP in 3-4 weeks to assess status on the new medication.  Follow with Dr Delton Coombes in 3 months or sooner if you have any problems.

## 2019-01-18 NOTE — Progress Notes (Signed)
Subjective:    Patient ID: ELASIA FURNISH, female    DOB: [birth year], 68 y.o.   MRN: [id number]  HPI  ROV 11/13/18 --this follow-up visit for 68 year old woman with untreated sleep apnea, COPD and severe obstruction based on spirometry, associated hypoxemia.  She also has chronic cough this been difficult to manage most recently.  At her last visit I continued her stable Nexium, Zyrtec, Flonase.  We tried changing her Anoro to Stiolto to see if the powdered formulation was contributing to her upper airway irritation. She may have had less UA irritation although her cough did not resolve.  It may have helped her breathing also but she did have side effects.  She has had some blurred vision, some dizziness. Her cough is paroxysmal, remains non-productive. Can happen with certain scents, lots of talking. Denies any overt GERD, PND.    CXR 10/03/18 reviewed, no abnormalities.   ROV 12/24/18 --this is a follow-up visit for 68 year old woman with severe COPD and associated hypoxemic respiratory failure, untreated sleep apnea, chronic cough.  This is been difficult to manage.  I tried changing her Anoro to [employer] but she did not tolerate, went back to Anoro last visit.  She is on Zyrtec and Flonase.  I increased her Nexium to twice a day last visit to see if she would benefit - no real change in her globus sensation or cough.  Chest x-ray 12/11/2018 was reviewed, showed mild hyperinflation with some bronchovascular prominence superiorly, unchanged.  No infiltrates. She recently saw Dr Dennard Schaumann and started a trial of Trelegy - she may be benefiting, better breathing. She is back to nexium qd. Still on flonase, zyrtec.    ROV 01/18/19 --patient is 41 with severe COPD with associated hypoxemic respiratory failure.  Untreated sleep apnea, chronic cough.  She has been treated aggressively for GERD, allergic rhinitis.  At her last visit she had just started Trelegy - isn't sure it was better than Anoro, now back on  the Anoro. She had an episode 1/23 when she became acutely SOB, felt that she was not going to get to her SABA in time. Couldn';t make air move. She has also had some worsening of her cough since then. She has O2 that she wear unreliably w exertion.   She underwent bronchoscopy on 12/31/2018 that showed normal airways, normal posterior pharynx.  A BAL was done that was cytology negative, there were 1290 WBC, 86% neutrophils, cultures grew normal flora, AFB and fungal smears negative.   Review of Systems  Respiratory: Positive for shortness of breath.    As per HPI      Objective:   Physical Exam Vitals:   01/18/19 1604  BP: 130/78  Pulse: (!) 102  Temp: 97.7 F (36.5 C)  SpO2: 92%  Weight: 141 lb 3.2 oz (64 kg)  Height: 5' (1.524 m)   Gen: Pleasant, well-nourished, in no distress,  normal affect  ENT: No lesions,  mouth clear,  oropharynx clear, no postnasal drip  Neck: No JVD, no stridor  Lungs: No use of accessory muscles, distant, clear, no wheeze  Cardiovascular: RRR, heart sounds normal, no murmur or gallops, no peripheral edema  Musculoskeletal: No deformities, no cyanosis or clubbing  Neuro: alert, non focal  Skin: Warm, no lesions or rashes      Assessment & Plan:  Chronic cough Still having some cough. Continue to treat her GERD and rhinitis. ? Whether some contribution of powdered inhaler. Her FOB was reassuring.   COPD (chronic  obstructive pulmonary disease) with emphysema (HCC) Severe obstruction. Still with significant dyspnea. She is on Anoro, didn't like the Trelegy.  Will try changing her over to neb therapy, ? Whether she is getting adequate delivery of her Anoro.   We will stop Anoro for now Please start using scheduled nebulized medication as follows: - Brovana + budesonide (Pulmicort) twice a day - ipratropium (Atrovent) three times a day.  Keep your albuterol available to use 2 puffs up to every 4 hours if needed for shortness of breath,  wheeze, chest tightness Walking oximetry on room air today. You will need to continue to use oxygen at 2L/min with exertion Please follow with APP in 3-4 weeks to assess status on the new medication.  Follow with Dr Lamonte Sakai in 3 months or sooner if you have any problems.  Baltazar Apo, MD, PhD 01/18/2019, 4:39 PM  Pulmonary and Critical Care [phone] or if no answer [phone]

## 2019-01-18 NOTE — Addendum Note (Signed)
Addended by: Ander Slade on: 01/18/2019 04:59 PM   Modules accepted: Orders

## 2019-01-18 NOTE — Patient Instructions (Signed)
We will stop Anoro for now Please start using scheduled nebulized medication as follows: - Brovana + budesonide (Pulmicort) twice a day - ipratropium (Atrovent) three times a day.  Keep your albuterol available to use 2 puffs up to every 4 hours if needed for shortness of breath, wheeze, chest tightness Walking oximetry on room air today. You will need to continue to use oxygen at 2L/min with exertion Please follow with APP in 3-4 weeks to assess status on the new medication.  Follow with Dr Delton Coombes in 3 months or sooner if you have any problems.

## 2019-01-18 NOTE — Assessment & Plan Note (Signed)
Still having some cough. Continue to treat her GERD and rhinitis. ? Whether some contribution of powdered inhaler. Her FOB was reassuring.

## 2019-01-18 NOTE — Addendum Note (Signed)
Addended by: Ander Slade on: 01/18/2019 04:53 PM   Modules accepted: Orders

## 2019-01-30 ENCOUNTER — Ambulatory Visit: Payer: Medicaid Other | Admitting: Emergency Medicine

## 2019-01-31 ENCOUNTER — Other Ambulatory Visit: Payer: Self-pay | Admitting: *Deleted

## 2019-01-31 ENCOUNTER — Other Ambulatory Visit: Payer: Self-pay

## 2019-01-31 DIAGNOSIS — R05 Cough: Secondary | ICD-10-CM

## 2019-01-31 DIAGNOSIS — R053 Chronic cough: Secondary | ICD-10-CM

## 2019-01-31 DIAGNOSIS — J438 Other emphysema: Secondary | ICD-10-CM

## 2019-01-31 MED ORDER — ARFORMOTEROL TARTRATE 15 MCG/2ML IN NEBU: 15.0000 ug | INHALATION_SOLUTION | Freq: Two times a day (BID) | RESPIRATORY_TRACT | 6 refills | Status: DC

## 2019-02-01 ENCOUNTER — Telehealth: Payer: Self-pay | Admitting: Adult Health

## 2019-02-01 NOTE — Telephone Encounter (Signed)
Called and spoke with Taylor Burnett.  She stated that she had discussed with Patient medication changes that can help with donut hole.  Patient has appointment with TP, 02/18/19, at 3:30pm. She said that if inhalers can be changed to nebs, and sent to [facility], that would save.  TP just needs to document in notes for [employer].  Message route to [place] as Lorain Childes

## 2019-02-02 LAB — FUNGAL ORGANISM REFLEX

## 2019-02-02 LAB — FUNGUS CULTURE WITH STAIN

## 2019-02-02 LAB — FUNGUS CULTURE RESULT

## 2019-02-04 NOTE — Telephone Encounter (Signed)
Noted, thank you Will sign off and discuss with patient at upcoming visit with Parrett NP

## 2019-02-05 LAB — AFB ORGANISM ID BY DNA PROBE
M avium complex: NEGATIVE
M gordonae: POSITIVE — AB
M kansasii: NEGATIVE
M tuberculosis complex: NEGATIVE

## 2019-02-05 LAB — ACID FAST CULTURE WITH REFLEXED SENSITIVITIES: ACID FAST CULTURE - AFSCU3: POSITIVE — AB

## 2019-02-05 LAB — ACID FAST CULTURE WITH REFLEXED SENSITIVITIES (MYCOBACTERIA)

## 2019-02-08 ENCOUNTER — Ambulatory Visit (INDEPENDENT_AMBULATORY_CARE_PROVIDER_SITE_OTHER): Payer: Medicare Other | Admitting: Adult Health

## 2019-02-08 ENCOUNTER — Encounter: Payer: Self-pay | Admitting: Adult Health

## 2019-02-08 DIAGNOSIS — R053 Chronic cough: Secondary | ICD-10-CM

## 2019-02-08 DIAGNOSIS — J438 Other emphysema: Secondary | ICD-10-CM

## 2019-02-08 DIAGNOSIS — R05 Cough: Secondary | ICD-10-CM

## 2019-02-08 DIAGNOSIS — J9611 Chronic respiratory failure with hypoxia: Secondary | ICD-10-CM

## 2019-02-08 DIAGNOSIS — R059 Cough, unspecified: Secondary | ICD-10-CM

## 2019-02-08 MED ORDER — ARFORMOTEROL TARTRATE 15 MCG/2ML IN NEBU: 15.0000 ug | INHALATION_SOLUTION | Freq: Two times a day (BID) | RESPIRATORY_TRACT | 5 refills | Status: DC

## 2019-02-08 MED ORDER — BUDESONIDE 0.25 MG/2ML IN SUSP: 0.2500 mg | Freq: Two times a day (BID) | RESPIRATORY_TRACT | 5 refills | Status: DC

## 2019-02-08 MED ORDER — IPRATROPIUM BROMIDE 0.02 % IN SOLN: 0.5000 mg | Freq: Three times a day (TID) | RESPIRATORY_TRACT | 5 refills | Status: DC

## 2019-02-08 NOTE — Progress Notes (Signed)
@Patient  ID: Taylor Burnett, female    DOB: Jun 08, 1951, 68 y.o.   MRN: [id number]  Chief Complaint  Patient presents with  . Follow-up    COPD     Referring provider: Donita Brooks, MD  HPI: 68 year old female former smoker followed for very severe COPD with associated hypoxic respiratory failure on oxygen 2 L with activity and at rest, chronic cough.  She has sleep apnea but is intolerant to CPAP.   TEST/EVENTS :  Bronchoscopy 12/31/2018 showed normal airways, normal posterior pharynx.  Bowel showed negative cytology.  Fungal smears were negative. Final AFB showed M gordonae  02/08/2019 Follow up : COPD , O2 RF  Patient returns for a 3-week follow-up.  Patient has very severe COPD that is oxygen dependent.  Last visit patient was changed from Anoro to nebulizers with budesonide and Brovana twice daily and ipratropium nebs 3 times daily.  Patient says that she did like them however only had samples.  Prescriptions were sent to [facility] however insurance was declined there.. We discussed her options and she would like to have the sent to another DME.  Also AFB cultures came back from bronchoscopy done last month.  They were positive for M gordonae.  She has a minimally productive cough.  No fever.  No weight loss.  No hemoptysis.   She remains on oxygen with activity and at bedtime.    Allergies  Allergen Reactions  . Prednisone Other (See Comments)    Depression and suicidal    Immunization History  Administered Date(s) Administered  . Pneumococcal Polysaccharide-23 04/20/2018  . Tdap 06/06/2016    Past Medical History:  Diagnosis Date  . Arthritis   . Cataract   . COPD (chronic obstructive pulmonary disease) (HCC)   . Cough   . DDD (degenerative disc disease), cervical   . Dysrhythmia    RAPID HEART BEAT OCC   . Emphysema of lung (HCC)   . GERD (gastroesophageal reflux disease)    OCC  . H/O degenerative disc disease   . Headache   . Myocardial  infarction (HCC) 2016  . Osteoporosis   . Panic attacks   . Pre-diabetes   . Severe lung disease   . Shortness of breath dyspnea   . Sleep apnea   . Spondylosis     Tobacco History: Social History   Tobacco Use  Smoking Status Former Smoker  . Packs/day: 2.00  . Years: 40.00  . Pack years: 80.00  . Types: Cigarettes  . Last attempt to quit: 07/31/2013  . Years since quitting: 5.5  Smokeless Tobacco Never Used   Counseling given: Not Answered   Outpatient Medications Prior to Visit  Medication Sig Dispense Refill  . arformoterol (BROVANA) 15 MCG/2ML NEBU Take 2 mLs (15 mcg total) by nebulization 2 (two) times daily. 120 mL 6  . Ascorbic Acid (VITAMIN C) 1000 MG tablet Take 1,000 mg by mouth daily.    . cetirizine (ZYRTEC) 10 MG tablet Take 10 mg by mouth at bedtime.     . Cholecalciferol (VITAMIN D) 2000 units CAPS Take 2,000 Units by mouth daily.     Marland Kitchen esomeprazole (NEXIUM) 40 MG capsule Take 1 capsule (40 mg total) by mouth daily at 12 noon. 31 capsule 0  . fluticasone (FLONASE) 50 MCG/ACT nasal spray Place 2 sprays into both nostrils daily. (Patient taking differently: Place 2 sprays into both nostrils every evening. ) 16 g 2  . gabapentin (NEURONTIN) 100 MG capsule Take 1 capsule (  100 mg total) by mouth at bedtime.    Marland Kitchen ipratropium (ATROVENT) 0.02 % nebulizer solution Take 2.5 mLs (0.5 mg total) by nebulization 3 (three) times daily. 75 mL 12  . Melatonin 10 MG TABS Take 10 mg by mouth at bedtime.    . naproxen sodium (ALEVE) 220 MG tablet Take 440 mg by mouth 2 (two) times daily as needed (pain).     . OXYGEN Inhale 2 L into the lungs as needed. FOR USE WITH EXERTION    . Polyethyl Glycol-Propyl Glycol (SYSTANE OP) Place 1 drop into both eyes daily as needed (dry eyes).    Marland Kitchen PROAIR HFA 108 (90 Base) MCG/ACT inhaler INHALE 2 PUFFS INTO THE LUNGS EVERY 6 HOURS AS NEEDED. (Patient taking differently: Inhale 2 puffs into the lungs every 6 (six) hours as needed for wheezing or  shortness of breath. ) 1 Inhaler 5  . vitamin B-12 (CYANOCOBALAMIN) 1000 MCG tablet Take 1,000 mcg by mouth daily.    . budesonide-formoterol (SYMBICORT) 160-4.5 MCG/ACT inhaler Inhale 2 puffs into the lungs 2 (two) times daily. (Patient not taking: Reported on 02/08/2019) 1 Inhaler 0  . umeclidinium-vilanterol (ANORO ELLIPTA) 62.5-25 MCG/INH AEPB Inhale 1 puff into the lungs daily.     No facility-administered medications prior to visit.      Review of Systems:   Constitutional:   No  weight loss, night sweats,  Fevers, chills,  +fatigue, or  lassitude.  HEENT:   No headaches,  Difficulty swallowing,  Tooth/dental problems, or  Sore throat,                No sneezing, itching, ear ache, nasal congestion, post nasal drip,   CV:  No chest pain,  Orthopnea, PND, swelling in lower extremities, anasarca, dizziness, palpitations, syncope.   GI  No heartburn, indigestion, abdominal pain, nausea, vomiting, diarrhea, change in bowel habits, loss of appetite, bloody stools.   Resp:   No chest wall deformity  Skin: no rash or lesions.  GU: no dysuria, change in color of urine, no urgency or frequency.  No flank pain, no hematuria   MS:  No joint pain or swelling.  No decreased range of motion.  No back pain.    Physical Exam  BP 118/74 (BP Location: Left Arm, Cuff Size: Normal)   Pulse 85   Ht 5' (1.524 m)   Wt 141 lb 3.2 oz (64 kg)   SpO2 93%   BMI 27.58 kg/m   GEN: A/Ox3; pleasant , NAD, elderly female    HEENT:  [place]/AT,  EACs-clear, TMs-wnl, NOSE-clear, THROAT-clear, no lesions, no postnasal drip or exudate noted.   NECK:  Supple w/ fair ROM; no JVD; normal carotid impulses w/o bruits; no thyromegaly or nodules palpated; no lymphadenopathy.    RESP  Decreased BS in bases  . no accessory muscle use, no dullness to percussion  CARD:  RRR, no m/r/g, no peripheral edema, pulses intact, no cyanosis or clubbing.  GI:   Soft & nt; nml bowel sounds; no organomegaly or masses  detected.   Musco: Warm bil, no deformities or joint swelling noted.   Neuro: alert, no focal deficits noted.    Skin: Warm, no lesions or rashes    Lab Results:  CBC    Component Value Date/Time   WBC 7.1 12/11/2018 1210   RBC 4.86 12/11/2018 1210   HGB 14.7 12/11/2018 1210   HCT 45.0 12/11/2018 1210   PLT 292 12/11/2018 1210   MCV 92.6 12/11/2018 1210  MCH 30.2 12/11/2018 1210   MCHC 32.7 12/11/2018 1210   RDW 12.4 12/11/2018 1210   LYMPHSABS 1,669 12/11/2018 1210   MONOABS 368 06/19/2017 1358   EOSABS 284 12/11/2018 1210   BASOSABS 50 12/11/2018 1210    BMET    Component Value Date/Time   NA 140 12/27/2018 1454   K 4.3 12/27/2018 1454   CL 102 12/27/2018 1454   CO2 32 12/27/2018 1454   GLUCOSE 85 12/27/2018 1454   BUN 17 12/27/2018 1454   CREATININE 0.82 12/27/2018 1454   CALCIUM 9.6 12/27/2018 1454   GFRNONAA 68 12/11/2018 1210   GFRAA 79 12/11/2018 1210    BNP No results found for: BNP  ProBNP No results found for: PROBNP  Imaging: No results found.    PFT Results Latest Ref Rng & Units 11/14/2016  FVC-Pre L 1.38  FVC-Predicted Pre % 47  FVC-Post L 1.10  FVC-Predicted Post % 37  Pre FEV1/FVC % % 42  Post FEV1/FCV % % 51  FEV1-Pre L 0.58  FEV1-Predicted Pre % 25  FEV1-Post L 0.56  DLCO UNC% % 15  DLCO COR %Predicted % 40  TLC L 5.04  TLC % Predicted % 106  RV % Predicted % 186    No results found for: NITRICOXIDE      Assessment & Plan:   COPD (chronic obstructive pulmonary disease) with emphysema (HCC) Very Severe COPD , O2 dependent  Patient has high symptom burden.  Has been tried on multiple inhalers in the past.  Would recommend that she continue with Brovana budesonide and ipratropium nebs.  May have better symptom control with these. These will be sent to a local DME company. Add flutter valve and Mucinex to help with mucus production. Recent bronchoscopy did show an atypical AFB.  Will need further evaluation to see if  this is a possible active infection.  High-resolution CT chest is pending   Plan  Patient Instructions  Set up HRCT chest.  Restart Brovana and Budesonide Neb Twice daily   Restart Ipratropium Neb Three times a day  .  Flutter valve Three times a day  As needed  Congestion .  Mucinex DM Twice daily  As needed  Cough/congestion  Continue on Oxygen with activity and At bedtime   Follow up with Dr. Delton Coombes  In 4 weeks and As needed   Please contact office for sooner follow up if symptoms do not improve or worsen or seek emergency care       Chronic respiratory failure (HCC) Cont on O2 with activity and At bedtime       Rubye Oaks, NP 02/08/2019

## 2019-02-08 NOTE — Addendum Note (Signed)
Addended by: Boone Master E on: 02/08/2019 04:57 PM   Modules accepted: Orders

## 2019-02-08 NOTE — Assessment & Plan Note (Signed)
Cont on O2 with activity and At bedtime

## 2019-02-08 NOTE — Patient Instructions (Addendum)
Set up HRCT chest.  Restart Brovana and Budesonide Neb Twice daily   Restart Ipratropium Neb Three times a day  .  Flutter valve Three times a day  As needed  Congestion .  Mucinex DM Twice daily  As needed  Cough/congestion  Continue on Oxygen with activity and At bedtime   Follow up with Dr. Delton Coombes  In 4 weeks and As needed   Please contact office for sooner follow up if symptoms do not improve or worsen or seek emergency care

## 2019-02-08 NOTE — Assessment & Plan Note (Signed)
Very Severe COPD , O2 dependent  Patient has high symptom burden.  Has been tried on multiple inhalers in the past.  Would recommend that she continue with Brovana budesonide and ipratropium nebs.  May have better symptom control with these. These will be sent to a local DME company. Add flutter valve and Mucinex to help with mucus production. Recent bronchoscopy did show an atypical AFB.  Will need further evaluation to see if this is a possible active infection.  High-resolution CT chest is pending   Plan  Patient Instructions  Set up HRCT chest.  Restart Brovana and Budesonide Neb Twice daily   Restart Ipratropium Neb Three times a day  .  Flutter valve Three times a day  As needed  Congestion .  Mucinex DM Twice daily  As needed  Cough/congestion  Continue on Oxygen with activity and At bedtime   Follow up with Dr. Delton Coombes  In 4 weeks and As needed   Please contact office for sooner follow up if symptoms do not improve or worsen or seek emergency care

## 2019-02-11 ENCOUNTER — Ambulatory Visit: Payer: Medicare Other

## 2019-02-12 ENCOUNTER — Ambulatory Visit: Payer: Medicare Other

## 2019-02-12 ENCOUNTER — Telehealth: Payer: Self-pay | Admitting: Family Medicine

## 2019-02-12 NOTE — Telephone Encounter (Signed)
I do not think that was from prolia. I would try again.

## 2019-02-12 NOTE — Telephone Encounter (Signed)
Patient came into office today to receive her 2nd Prolia injection. When we got to the room patient informed me that after the first injection in August she developed horrible joint pain to her jaw, neck, and back and she also developed what patient describes as dermatitis to her scalp that caused her hair to fall out and dry itching spots all over her skin. She stated symptoms began approximately 2 months after getting injection. Advised patient that we would hold off on the injection until PCP was notified. Please advise?

## 2019-02-13 ENCOUNTER — Ambulatory Visit (INDEPENDENT_AMBULATORY_CARE_PROVIDER_SITE_OTHER): Payer: Medicare Other | Admitting: Family Medicine

## 2019-02-13 DIAGNOSIS — M810 Age-related osteoporosis without current pathological fracture: Secondary | ICD-10-CM

## 2019-02-13 MED ORDER — DENOSUMAB 60 MG/ML ~~LOC~~ SOSY
60.0000 mg | PREFILLED_SYRINGE | SUBCUTANEOUS | Status: AC
  Administered 2019-02-13: 60 mg via SUBCUTANEOUS

## 2019-02-13 NOTE — Telephone Encounter (Signed)
Spoke with patient and informed her that Dr. Tanya Nones does not believe reaction came from Prolia. Patient verbalized understanding and stated that she would come in for the injection.

## 2019-02-25 ENCOUNTER — Ambulatory Visit (INDEPENDENT_AMBULATORY_CARE_PROVIDER_SITE_OTHER)
Admission: RE | Admit: 2019-02-25 | Discharge: 2019-02-25 | Disposition: A | Discharge status: Home / Self Care | Payer: Medicare Other | Source: Ambulatory Visit | Attending: Adult Health | Admitting: Adult Health

## 2019-02-25 DIAGNOSIS — R059 Cough, unspecified: Secondary | ICD-10-CM

## 2019-02-25 DIAGNOSIS — R05 Cough: Secondary | ICD-10-CM | POA: Diagnosis not present

## 2019-02-25 DIAGNOSIS — R918 Other nonspecific abnormal finding of lung field: Secondary | ICD-10-CM | POA: Diagnosis not present

## 2019-03-01 NOTE — Progress Notes (Signed)
Spoke with pt and notified of results per Tammy Parrett, NP. Pt verbalized understanding and denied any questions. 

## 2019-03-11 ENCOUNTER — Encounter: Payer: Self-pay | Admitting: Emergency Medicine

## 2019-03-11 ENCOUNTER — Other Ambulatory Visit: Payer: Self-pay

## 2019-03-11 ENCOUNTER — Ambulatory Visit (INDEPENDENT_AMBULATORY_CARE_PROVIDER_SITE_OTHER): Payer: Medicare Other | Admitting: Emergency Medicine

## 2019-03-11 DIAGNOSIS — J438 Other emphysema: Secondary | ICD-10-CM

## 2019-03-11 DIAGNOSIS — A319 Mycobacterial infection, unspecified: Secondary | ICD-10-CM | POA: Insufficient documentation

## 2019-03-11 DIAGNOSIS — G4730 Sleep apnea, unspecified: Secondary | ICD-10-CM | POA: Diagnosis not present

## 2019-03-11 DIAGNOSIS — R05 Cough: Secondary | ICD-10-CM | POA: Diagnosis not present

## 2019-03-11 DIAGNOSIS — J9611 Chronic respiratory failure with hypoxia: Secondary | ICD-10-CM | POA: Diagnosis not present

## 2019-03-11 DIAGNOSIS — R053 Chronic cough: Secondary | ICD-10-CM

## 2019-03-11 NOTE — Patient Instructions (Signed)
Please continue your Brovana and budesonide (Pulmicort) nebulizer treatments twice a day on a schedule Please continue your ipratropium (Atrovent) nebulizer treatments 3 times a day on a schedule Keep your albuterol available to use either 2 puffs or 1 nebulizer treatment up to every 4 hours if needed for shortness of breath, chest tightness, wheezing. Please continue to use your oxygen while sleeping and with exertion. Please continue Nexium, Flonase, Zyrtec as you have been using them. We will hold off on treating your Mycobacterium gordonae at this time given your stable symptoms and CT scan of the chest.  Depending on how you progress we may decide that there would be benefit to treating at some point in the future. Follow with Dr Delton Coombes in 3 months or sooner if you have any problems.

## 2019-03-11 NOTE — Assessment & Plan Note (Signed)
Continue her same oxygen at night and with exertion

## 2019-03-11 NOTE — Progress Notes (Signed)
Subjective:    Patient ID: Taylor Burnett, female    DOB: September 26, 1951, 68 y.o.   MRN: [id number]  Shortness of Breath     ROV 01/18/19 --patient is 65 with severe COPD with associated hypoxemic respiratory failure.  Untreated sleep apnea, chronic cough.  She has been treated aggressively for GERD, allergic rhinitis.  At her last visit she had just started Trelegy - isn't sure it was better than Anoro, now back on the Anoro. She had an episode 1/23 when she became acutely SOB, felt that she was not going to get to her SABA in time. Couldn';t make air move. She has also had some worsening of her cough since then. She has O2 that she wear unreliably w exertion.   She underwent bronchoscopy on 12/31/2018 that showed normal airways, normal posterior pharynx.  A BAL was done that was cytology negative, there were 1290 WBC, 86% neutrophils, cultures grew normal flora, AFB and fungal smears negative.  ROV 03/11/2019 --Taylor Burnett is 70 and has severe COPD with associated hypoxemic respiratory failure, untreated sleep apnea.  We have been dealing with chronic cough.  She has GERD and allergic rhinitis which are likely contributors.  Bronchoscopy was done 12/31/2018, initial smears negative but her AFB ultimately grew out Mycobacterium gordonae.  We have been managing her with Brovana, budesonide, ipratropium nebs since January.  A CT scan of her chest was done on 02/25/2019 which I reviewed.  There is no lymphadenopathy, there is no groundglass, traction bronchiectasis or honeycomb change.  There is some mild diffuse bronchial wall thickening with multiple small pulmonary nodules noted throughout the lungs bilaterally similar to 2015. She describes exertional SOB, also notes that the nebs don't last all day - feels that the brovana helps her the most but she needs it again by 6pm. She is doing a bit of coughing, mainly in the am, but not through the day, usually non-productive. She is on nexium, flonase, zyrtec. She sleeps  w her O2 and wears it w most exertion.    Review of Systems  Respiratory: Positive for shortness of breath.    As per HPI      Objective:   Physical Exam Vitals:   03/11/19 1358  BP: 124/72  Pulse: 86  SpO2: 95%  Weight: 139 lb (63 kg)  Height: 5' (1.524 m)   Gen: Pleasant, well-nourished, in no distress,  normal affect  ENT: No lesions,  mouth clear,  oropharynx clear, no postnasal drip  Neck: No JVD, no stridor  Lungs: No use of accessory muscles, distant, clear, no wheeze  Cardiovascular: RRR, heart sounds normal, no murmur or gallops, no peripheral edema  Musculoskeletal: No deformities, no cyanosis or clubbing  Neuro: alert, non focal  Skin: Warm, no lesions or rashes      Assessment & Plan:  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  She has cough and dyspnea even at baseline due to the severity of her COPD.  Her CT scan of the chest from 02/25/2019 was overall reassuring with minimal evidence for an infectious or inflammatory process, little change from 2015.  I talked her about the pros and cons of empiric therapy today.  If we did decide to treat we would likely use clarithromycin and ethambutol.  For now, hold off, follow her clinically, follow her radiographically if symptoms dictate.  Chronic cough Multifactorial with contributions from her COPD, GERD, allergic rhinitis and now possibly from mycobacterial  infection.  We will treat her chronic issues as below.  If the symptoms progress then we may need to consider treating empirically for Mycobacterium gordonae as well.  We will discuss next time  Chronic respiratory failure ([place]) Continue her same oxygen at night and with exertion  COPD (chronic obstructive pulmonary disease) with emphysema ([place]) She seems to be doing better since we changed her BD's to nebulized form.  We will continue the same.  No exacerbations since last time.   Sleep apnea in adult Untreated  Taylor Apo, MD, PhD 03/11/2019, 2:39 PM [facility] [phone] or if no answer [phone]

## 2019-03-11 NOTE — Assessment & Plan Note (Signed)
Untreated  

## 2019-03-11 NOTE — Assessment & Plan Note (Signed)
Multifactorial with contributions from her COPD, GERD, allergic rhinitis and now possibly from mycobacterial infection.  We will treat her chronic issues as below.  If the symptoms progress then we may need to consider treating empirically for Mycobacterium gordonae as well.  We will discuss next time

## 2019-03-11 NOTE — Assessment & Plan Note (Signed)
She seems to be doing better since we changed her BD's to nebulized form.  We will continue the same.  No exacerbations since last time.

## 2019-03-11 NOTE — Assessment & Plan Note (Signed)
Difficult to discern whether this is a colonization or an active infectious process.  She has cough and dyspnea even at baseline due to the severity of her COPD.  Her CT scan of the chest from 02/25/2019 was overall reassuring with minimal evidence for an infectious or inflammatory process, little change from 2015.  I talked her about the pros and cons of empiric therapy today.  If we did decide to treat we would likely use clarithromycin and ethambutol.  For now, hold off, follow her clinically, follow her radiographically if symptoms dictate.

## 2019-03-13 ENCOUNTER — Encounter: Payer: Self-pay | Admitting: Family Medicine

## 2019-04-22 ENCOUNTER — Ambulatory Visit: Payer: Medicaid Other | Admitting: Emergency Medicine

## 2019-04-29 ENCOUNTER — Telehealth: Payer: Self-pay

## 2019-04-29 ENCOUNTER — Other Ambulatory Visit: Payer: Self-pay

## 2019-04-29 ENCOUNTER — Emergency Department (HOSPITAL_COMMUNITY)
Admission: EM | Admit: 2019-04-29 | Discharge: 2019-04-30 | Disposition: A | Discharge status: Home / Self Care | Payer: Medicare Other | Attending: Emergency Medicine | Admitting: Emergency Medicine

## 2019-04-29 ENCOUNTER — Encounter: Payer: Self-pay | Admitting: Family Medicine

## 2019-04-29 ENCOUNTER — Encounter (HOSPITAL_COMMUNITY): Payer: Self-pay | Admitting: *Deleted

## 2019-04-29 ENCOUNTER — Emergency Department (HOSPITAL_COMMUNITY): Payer: Medicare Other

## 2019-04-29 DIAGNOSIS — Z79899 Other long term (current) drug therapy: Secondary | ICD-10-CM | POA: Insufficient documentation

## 2019-04-29 DIAGNOSIS — I2510 Atherosclerotic heart disease of native coronary artery without angina pectoris: Secondary | ICD-10-CM | POA: Insufficient documentation

## 2019-04-29 DIAGNOSIS — J449 Chronic obstructive pulmonary disease, unspecified: Secondary | ICD-10-CM | POA: Insufficient documentation

## 2019-04-29 DIAGNOSIS — I252 Old myocardial infarction: Secondary | ICD-10-CM | POA: Insufficient documentation

## 2019-04-29 DIAGNOSIS — R0602 Shortness of breath: Secondary | ICD-10-CM | POA: Insufficient documentation

## 2019-04-29 DIAGNOSIS — Z9981 Dependence on supplemental oxygen: Secondary | ICD-10-CM | POA: Diagnosis not present

## 2019-04-29 DIAGNOSIS — R079 Chest pain, unspecified: Secondary | ICD-10-CM | POA: Diagnosis not present

## 2019-04-29 DIAGNOSIS — Z87891 Personal history of nicotine dependence: Secondary | ICD-10-CM | POA: Diagnosis not present

## 2019-04-29 LAB — BASIC METABOLIC PANEL
Anion gap: 11 (ref 5–15)
BUN: 15 mg/dL (ref 8–23)
CO2: 27 mmol/L (ref 22–32)
Calcium: 9.6 mg/dL (ref 8.9–10.3)
Chloride: 102 mmol/L (ref 98–111)
Creatinine, Ser: 0.93 mg/dL (ref 0.44–1.00)
GFR calc Af Amer: >60 mL/min (ref >60)
GFR calc non Af Amer: >60 mL/min (ref >60)
Glucose, Bld: 101 mg/dL — ABNORMAL HIGH (ref 70–99)
Potassium: 4 mmol/L (ref 3.5–5.1)
Sodium: 140 mmol/L (ref 135–145)

## 2019-04-29 LAB — CBC
HCT: 40.7 % (ref 36.0–46.0)
Hemoglobin: 13 g/dL (ref 12.0–15.0)
MCH: 30.2 pg (ref 26.0–34.0)
MCHC: 31.9 g/dL (ref 30.0–36.0)
MCV: 94.4 fL (ref 80.0–100.0)
Platelets: 264 10*3/uL (ref 150–400)
RBC: 4.31 MIL/uL (ref 3.87–5.11)
RDW: 13.6 % (ref 11.5–15.5)
WBC: 6.7 10*3/uL (ref 4.0–10.5)
nRBC: 0 % (ref 0.0–0.2)

## 2019-04-29 LAB — TROPONIN I: Troponin I: <0.03 ng/mL (ref <0.03)

## 2019-04-29 MED ORDER — SODIUM CHLORIDE 0.9% FLUSH: 3.0000 mL | Freq: Once | INTRAVENOUS | Status: DC

## 2019-04-29 NOTE — Telephone Encounter (Signed)
Called and spoke with pt in regards to the [facility] message that she sent in regards to an appt. While I was speaking with pt, she stated to me that she had not been feeling well as she has had worsening SOB, dry cough, and chest tightness x2 weeks now. Pt states that she uses her nebulizer three times daily and usually uses her rescue inhaler 2-4 times daily.   Asked pt if she had a pulse ox that she could use to check her O2 sats and pt stated that it has been running in the mid 80s and this is on room air. Pt does have O2 that she wears at night and with exertion as needed.  While speaking with pt, she stated due to how she was feeling, she was going to go to the ER to be further evaluated. Nothing further needed.

## 2019-04-29 NOTE — ED Provider Notes (Signed)
[facility] EMERGENCY DEPARTMENT Provider Note   CSN: [id number] Arrival date & time: 04/29/19  1630    History   Chief Complaint Chief Complaint  Patient presents with  . Chest Pain    HPI Taylor Burnett is a 68 y.o. female.     HPI 68 year old with history of COPD on 2 L of oxygen intermittently as needed, CAD, DDD arthritis presents with complaints of shortness of breath.  Patient states that she has had ongoing shortness of breath for months but feels like symptoms are worsened over the past few weeks despite use of albuterol nebulizer, Pulmicort, and Brovana.  Patient states that she is also had some chest pain over the past 3 days intermittently.  Chest pain not worse with exertion, nonradiating.  No associated vomiting or diaphoresis with the pain.  Patient denies change in her cough, sputum production.  No fevers.  No sick contacts.  Past Medical History:  Diagnosis Date  . Arthritis   . Cataract   . COPD (chronic obstructive pulmonary disease) (HCC)   . Cough   . DDD (degenerative disc disease), cervical   . Dysrhythmia    RAPID HEART BEAT OCC   . Emphysema of lung (HCC)   . GERD (gastroesophageal reflux disease)    OCC  . H/O degenerative disc disease   . Headache   . Myocardial infarction (HCC) 2016  . Osteoporosis   . Panic attacks   . Pre-diabetes   . Severe lung disease   . Shortness of breath dyspnea   . Sleep apnea   . Spondylosis     Patient Active Problem List   Diagnosis Date Noted  . Mycobacterium gordonae infection 03/11/2019  . Chronic cough 10/22/2018  . GERD (gastroesophageal reflux disease) 07/20/2018  . Anxiety 06/21/2017  . Prediabetes 06/21/2017  . Chronic respiratory failure (HCC) 04/18/2017  . Other insomnia 01/23/2017  . COPD (chronic obstructive pulmonary disease) with emphysema (HCC) 01/16/2017  . Sleep related hypoxia 01/16/2017  . Sleep apnea in adult 07/11/2016  . Chronic rhinitis 01/05/2016  . S/P  cervical spinal fusion 03/13/2015  . Hemoptysis 01/01/2014  . Atypical chest pain 06/08/2012  . Neutropenia 12/23/2011  . Thrombocytopenia (HCC) 12/22/2011  . Hypoxemia 12/19/2011    Past Surgical History:  Procedure Laterality Date  . ANTERIOR CERVICAL DECOMP/DISCECTOMY FUSION N/A 03/13/2015   Procedure: CERVICAL THREE-CERVICAL FOUR, CERVICAL FOUR-CERVICAL FIVE, CERVICAL FIVE-CERVICAL SIX ANTERIOR CERVICAL DECOMPRESSION/DISCECTOMY FUSION 3 LEVELS;  Surgeon: Tia Alert, MD;  Location: [facility];  Service: Neurosurgery;  Laterality: N/A;  . BREAST ENHANCEMENT SURGERY  1990  . CARDIAC CATHETERIZATION  1995  . CATARACT EXTRACTION W/PHACO Right 05/02/2017   Procedure: CATARACT EXTRACTION PHACO AND INTRAOCULAR LENS PLACEMENT (IOC);  Surgeon: Jethro Bolus, MD;  Location: [facility];  Service: Ophthalmology;  Laterality: Right;  CDE: 6.22  . CATARACT EXTRACTION W/PHACO Left 05/16/2017   Procedure: CATARACT EXTRACTION PHACO AND INTRAOCULAR LENS PLACEMENT (IOC);  Surgeon: Jethro Bolus, MD;  Location: [facility];  Service: Ophthalmology;  Laterality: Left;  CDE: 4.10  . TONSILLECTOMY    . TUBAL LIGATION    . VIDEO BRONCHOSCOPY Bilateral 12/31/2018   Procedure: VIDEO BRONCHOSCOPY WITHOUT FLUORO;  Surgeon: Leslye Peer, MD;  Location: [facility];  Service: Cardiopulmonary;  Laterality: Bilateral;     OB History   No obstetric history on file.      Home Medications    Prior to Admission medications   Medication Sig Start Date End Date Taking? Authorizing  Provider  arformoterol (BROVANA) 15 MCG/2ML NEBU Take 2 mLs (15 mcg total) by nebulization 2 (two) times daily. Dx: J44.5 02/08/19   Parrett, Virgel Bouquetammy S, NP  Ascorbic Acid (VITAMIN C) 1000 MG tablet Take 1,000 mg by mouth daily.    [provider]  budesonide (PULMICORT) 0.25 MG/2ML nebulizer solution Take 2 mLs (0.25 mg total) by nebulization 2 (two) times daily. Dx: J44.5 02/08/19   Parrett, Virgel Bouquetammy S, NP  cetirizine (ZYRTEC) 10 MG tablet  Take 10 mg by mouth at bedtime.     [provider]  Cholecalciferol (VITAMIN D) 2000 units CAPS Take 2,000 Units by mouth daily.     [provider]  denosumab (PROLIA) 60 MG/ML SOSY injection Inject 60 mg into the skin every 6 (six) months.    [provider]  esomeprazole (NEXIUM) 40 MG capsule Take 1 capsule (40 mg total) by mouth daily at 12 noon. 07/20/18   Leslye PeerByrum, Robert S, MD  fluticasone (FLONASE) 50 MCG/ACT nasal spray Place 2 sprays into both nostrils daily. Patient taking differently: Place 2 sprays into both nostrils every evening.  07/20/18   Byrum, Les Pouobert S, MD  gabapentin (NEURONTIN) 100 MG capsule Take 1 capsule (100 mg total) by mouth at bedtime. 12/31/18   Leslye PeerByrum, Robert S, MD  ipratropium (ATROVENT) 0.02 % nebulizer solution Take 2.5 mLs (0.5 mg total) by nebulization 3 (three) times daily. Dx: J44.5 02/08/19   Parrett, Virgel Bouquetammy S, NP  Melatonin 10 MG TABS Take 10 mg by mouth at bedtime.    [provider]  naproxen sodium (ALEVE) 220 MG tablet Take 440 mg by mouth 2 (two) times daily as needed (pain).     [provider]  OXYGEN Inhale 2 L into the lungs as needed. FOR USE WITH EXERTION    [provider]  Polyethyl Glycol-Propyl Glycol (SYSTANE OP) Place 1 drop into both eyes daily as needed (dry eyes).    [provider]  PROAIR HFA 108 (90 Base) MCG/ACT inhaler INHALE 2 PUFFS INTO THE LUNGS EVERY 6 HOURS AS NEEDED. Patient taking differently: Inhale 2 puffs into the lungs every 6 (six) hours as needed for wheezing or shortness of breath.  12/10/18   Leslye PeerByrum, Robert S, MD  vitamin B-12 (CYANOCOBALAMIN) 1000 MCG tablet Take 1,000 mcg by mouth daily.    [provider]    Family History Family History  Problem Relation Age of Onset  . Emphysema Father   . Allergies Mother   . Allergies Son   . Allergies Brother   . Heart disease Maternal Grandmother   . Rheum arthritis Brother   . Cancer Other        aunts and  uncles    Social History Social History   Tobacco Use  . Smoking status: Former Smoker    Packs/day: 2.00    Years: 40.00    Pack years: 80.00    Types: Cigarettes    Last attempt to quit: 07/31/2013    Years since quitting: 5.7  . Smokeless tobacco: Never Used  Substance Use Topics  . Alcohol use: No    Comment: OCC WINE   . Drug use: No     Allergies   Prednisone   Review of Systems Review of Systems  Constitutional: Negative for chills and fever.  HENT: Negative for ear pain and sore throat.   Eyes: Negative for pain and visual disturbance.  Respiratory: Positive for shortness of breath. Negative for cough.   Cardiovascular: Positive for  chest pain. Negative for palpitations.  Gastrointestinal: Negative for abdominal pain and vomiting.  Genitourinary: Negative for dysuria and hematuria.  Musculoskeletal: Negative for arthralgias and back pain.  Skin: Negative for color change and rash.  Neurological: Negative for seizures and syncope.  All other systems reviewed and are negative.    Physical Exam Updated Vital Signs BP 125/61 (BP Location: Right Arm)   Pulse 76   Temp 97.8 F (36.6 C) (Oral)   Resp 17   Ht 5' (1.524 m)   Wt 63 kg   SpO2 91%   BMI 27.15 kg/m   Physical Exam Vitals signs and nursing note reviewed.  Constitutional:      General: She is not in acute distress.    Appearance: She is well-developed.  HENT:     Head: Normocephalic and atraumatic.  Eyes:     Extraocular Movements: Extraocular movements intact.     Conjunctiva/sclera: Conjunctivae normal.  Neck:     Musculoskeletal: Neck supple.  Cardiovascular:     Rate and Rhythm: Normal rate and regular rhythm.     Heart sounds: No murmur.  Pulmonary:     Effort: Pulmonary effort is normal. No tachypnea, accessory muscle usage or respiratory distress.     Breath sounds: Normal breath sounds.     Comments: On 2 L nasal cannula oxygen Abdominal:     Palpations: Abdomen is soft.      Tenderness: There is no abdominal tenderness.  Musculoskeletal: Normal range of motion.     Right lower leg: No edema.     Left lower leg: No edema.  Skin:    General: Skin is warm and dry.  Neurological:     General: No focal deficit present.     Mental Status: She is alert and oriented to person, place, and time.      ED Treatments / Results  Labs (all labs ordered are listed, but only abnormal results are displayed) Labs Reviewed  BASIC METABOLIC PANEL - Abnormal; Notable for the following components:      Result Value   Glucose, Bld 101 (*)    All other components within normal limits  CBC  TROPONIN I    EKG EKG Interpretation  Date/Time:  Monday Apr 29 2019 16:40:01 EDT Ventricular Rate:  96 PR Interval:  176 QRS Duration: 52 QT Interval:  326 QTC Calculation: 411 R Axis:   82 Text Interpretation:  Normal sinus rhythm Septal infarct , age undetermined Abnormal ECG Confirmed by Benjiman Core [phone]) on 04/29/2019 10:49:00 PM Also confirmed by Benjiman Core [phone]), editor Barbette Hair [phone])  on 04/30/2019 7:13:44 AM   Radiology Dg Chest 2 View  Result Date: 04/29/2019 CLINICAL DATA:  Chest pain EXAM: CHEST - 2 VIEW COMPARISON:  12/11/2018 FINDINGS: The cardiac silhouette is unremarkable. The lungs are somewhat hyperexpanded. A background of emphysematous changes suspected. Bilateral breast implants are noted. IMPRESSION: No active cardiopulmonary disease. Chronic emphysematous changes bilaterally are again noted. Electronically Signed   By: Katherine Mantle M.D.   On: 04/29/2019 17:05    Procedures Procedures (including critical care time)  Medications Ordered in ED Medications - No data to display   Initial Impression / Assessment and Plan / ED Course  I have reviewed the triage vital signs and the nursing notes.  Pertinent labs & imaging results that were available during my care of the patient were reviewed by me and considered in my medical  decision making (see chart for details).  68 year old with history of  COPD on 2 L of oxygen intermittently as needed, CAD, DDD arthritis presents with complaints of shortness of breath.   EKG normal sinus rhythm.  No ischemic changes.  Normal intervals and axis.  X-ray shows no active cardiopulmonary disease.  Like emphysema changes bilaterally.  On exam he is on her baseline 2 L nasal cannula with no increased work of breathing.  Normal SPO2.  No wheezing on exam.   In talking the patient sounds like her symptoms not necessarily acutely worsening but is just been ongoing.  She states that it feels like her medications are not helping her as much as they have in the past.  She likely need medication adjustment by her pulmonologist but does not have a pneumonia or COPD exacerbation that requires further management or admission at this time.  After shared decision making with the patient we initially decided to obtain a delta troponin and discharge with close follow-up however patient does not want to stay for the second troponin.  She understood the risks of leaving without further evaluation.  I do have low suspicion for ACS at this time.  Patient discharged with strict return precautions.   Final Clinical Impressions(s) / ED Diagnoses   Final diagnoses:  Shortness of breath    ED Discharge Orders    None       Vallery Ridge, MD 04/30/19 1300    Benjiman Core, MD 04/30/19 2328

## 2019-04-29 NOTE — Telephone Encounter (Addendum)
Duplicate  Received this email:  I received a recall letter from you to make an appointment for July. However when I call the number it says it is not in service. [phone] was the number I called. Have you changed number? I'mhaving some problems.  Sent an email back telling her we would call to set up appointment.  Could someone please call?  Thanks!

## 2019-04-29 NOTE — ED Triage Notes (Signed)
Pt in c/o epigastric pain onset x 2-3 days, pt reports SOB, denies n/v/d, cardiac hx, worse pain with exertion, Dr. Allyson Sabal is cardiologist, A&O x4

## 2019-04-29 NOTE — Telephone Encounter (Signed)
I received this email and have replied.  Could someone please call her to set up appointment.  Thanks!  I received a recall letter from you to make an appointment for July. However when I call the number it says it is not in service. [phone] was the number I called. Have you changed number? I'mhaving some problems.

## 2019-04-29 NOTE — ED Notes (Signed)
Patient placed on 2lpm via [place], due to oxygen being at 91-92%. Patient advised that is her baseline due to severe lug disease, but is on oxygen when exerted.

## 2019-05-01 ENCOUNTER — Other Ambulatory Visit: Payer: Self-pay | Admitting: *Deleted

## 2019-06-07 ENCOUNTER — Ambulatory Visit: Payer: Medicaid Other | Admitting: Emergency Medicine

## 2019-07-30 ENCOUNTER — Ambulatory Visit (INDEPENDENT_AMBULATORY_CARE_PROVIDER_SITE_OTHER): Payer: Medicare Other | Admitting: Emergency Medicine

## 2019-07-30 ENCOUNTER — Other Ambulatory Visit: Payer: Self-pay

## 2019-07-30 ENCOUNTER — Encounter: Payer: Self-pay | Admitting: Emergency Medicine

## 2019-07-30 DIAGNOSIS — J438 Other emphysema: Secondary | ICD-10-CM | POA: Diagnosis not present

## 2019-07-30 DIAGNOSIS — J9611 Chronic respiratory failure with hypoxia: Secondary | ICD-10-CM | POA: Diagnosis not present

## 2019-07-30 DIAGNOSIS — Z23 Encounter for immunization: Secondary | ICD-10-CM | POA: Diagnosis not present

## 2019-07-30 NOTE — Assessment & Plan Note (Signed)
Please continue Brovana, Pulmicort nebs twice a day. Try doing your Atrovent nebulizers 4 times a day instead of 3 times a day on a schedule. Keep your albuterol available to use 2 puffs if needed for shortness of breath, chest tightness, wheezing. Get your flu shot in the fall You are due for the Prevnar 13 pneumonia shot. Follow with Dr Lamonte Sakai in 4 months or sooner if you have any problems.

## 2019-07-30 NOTE — Assessment & Plan Note (Signed)
I believe you need to start using your oxygen more liberally with exertion, even lighter exertion.

## 2019-07-30 NOTE — Patient Instructions (Addendum)
I believe you need to start using your oxygen more liberally with exertion, even lighter exertion. Please continue Brovana, Pulmicort nebs twice a day. Try doing your Atrovent nebulizers 4 times a day instead of 3 times a day on a schedule. Keep your albuterol available to use 2 puffs if needed for shortness of breath, chest tightness, wheezing. Get your flu shot in the fall You are due for the Prevnar 13 pneumonia shot. Follow with Dr Lamonte Sakai in 4 months or sooner if you have any problems.

## 2019-07-30 NOTE — Progress Notes (Signed)
Subjective:    Patient ID: Taylor Burnett, female    DOB: [birth year], 68 y.o.   MRN: [id number]  Shortness of Breath    ROV 03/11/2019 --Taylor Burnett is 45 and has severe COPD with associated hypoxemic respiratory failure, untreated sleep apnea.  We have been dealing with chronic cough.  She has GERD and allergic rhinitis which are likely contributors.  Bronchoscopy was done 12/31/2018, initial smears negative but her AFB ultimately grew out Mycobacterium gordonae.  We have been managing her with Brovana, budesonide, ipratropium nebs since January.  A CT scan of her chest was done on 02/25/2019 which I reviewed.  There is no lymphadenopathy, there is no groundglass, traction bronchiectasis or honeycomb change.  There is some mild diffuse bronchial wall thickening with multiple small pulmonary nodules noted throughout the lungs bilaterally similar to 2015. She describes exertional SOB, also notes that the nebs don't last all day - feels that the brovana helps her the most but she needs it again by 6pm. She is doing a bit of coughing, mainly in the am, but not through the day, usually non-productive. She is on nexium, flonase, zyrtec. She sleeps w her O2 and wears it w most exertion.   ROV 07/30/2019 --68 year old woman with severe COPD, hypoxemic respiratory failure, untreated obstructive sleep apnea, Mycobacterium gordonae colonization.  She has chronic cough due to this as well as GERD, allergic rhinitis.  We changed her bronchodilators to nebulized form, currently on Brovana, Pulmicort, scheduled Atrovent 3 times daily.  She has albuterol which she uses approximately.  Her oxygen is set on. She describes episodic dyspnea, "feels that she is suffocating", can happen a few times a week. Not always associated with exertion. Can get some relief from her O2 and her SABA. No associated pain, cough, wheeze. Scares her and assoc with some anxiety. She has a daily cough, never produces mucous. She wears her O2 when she  goes out at 3L/min, rarely wears it at home. She notes some desaturations during the day.  She remains on fluticasone nasal spray, Nexium twice daily, Zyrtec.    Review of Systems  Respiratory: Positive for shortness of breath.    As per HPI      Objective:   Physical Exam Vitals:   07/30/19 1032  BP: 120/80  Pulse: 88  SpO2: 97%  Weight: 141 lb (64 kg)  Height: 5' (1.524 m)   Gen: Pleasant, well-nourished, in no distress,  normal affect  ENT: No lesions,  mouth clear,  oropharynx clear, no postnasal drip  Neck: No JVD, no stridor  Lungs: No use of accessory muscles, distant, clear, no wheeze.  She does have wheeze on forced expiration  Cardiovascular: RRR, heart sounds normal, no murmur or gallops, no peripheral edema  Musculoskeletal: No deformities, no cyanosis or clubbing  Neuro: alert, non focal  Skin: Warm, no lesions or rashes      Assessment & Plan:  COPD (chronic obstructive pulmonary disease) with emphysema (HCC) Please continue Brovana, Pulmicort nebs twice a day. Try doing your Atrovent nebulizers 4 times a day instead of 3 times a day on a schedule. Keep your albuterol available to use 2 puffs if needed for shortness of breath, chest tightness, wheezing. Get your flu shot in the fall You are due for the Prevnar 13 pneumonia shot. Follow with Dr Delton Coombes in 4 months or sooner if you have any problems.  Chronic respiratory failure (HCC) I believe you need to start using your oxygen more liberally with  exertion, even lighter exertion.  Baltazar Apo, MD, PhD 07/30/2019, 10:59 AM [facility] [phone] or if no answer [phone]

## 2019-08-16 ENCOUNTER — Ambulatory Visit: Payer: Medicare Other

## 2019-08-19 ENCOUNTER — Other Ambulatory Visit: Payer: Self-pay

## 2019-08-19 MED ORDER — ESOMEPRAZOLE MAGNESIUM 40 MG PO CPDR: 40.0000 mg | DELAYED_RELEASE_CAPSULE | Freq: Every day | ORAL | 3 refills | Status: DC

## 2019-08-21 NOTE — Telephone Encounter (Signed)
Please ask her to go back to how she was doing her inhaled medications originally, before we increased the ipratropium. She should call us back to insure that her symptoms improve. Thanks.

## 2019-08-23 ENCOUNTER — Encounter: Payer: Self-pay | Admitting: *Deleted

## 2019-09-19 ENCOUNTER — Telehealth: Payer: Self-pay | Admitting: Emergency Medicine

## 2019-09-19 NOTE — Telephone Encounter (Signed)
Returned call to patient and scheduled for televisit (acute) as patient does not have internet. Pt sent mychart message earlier today. Nothing further needed.

## 2019-09-20 ENCOUNTER — Ambulatory Visit (INDEPENDENT_AMBULATORY_CARE_PROVIDER_SITE_OTHER): Payer: Medicare Other | Admitting: Adult Health

## 2019-09-20 ENCOUNTER — Encounter: Payer: Self-pay | Admitting: Adult Health

## 2019-09-20 ENCOUNTER — Other Ambulatory Visit: Payer: Self-pay

## 2019-09-20 DIAGNOSIS — J9611 Chronic respiratory failure with hypoxia: Secondary | ICD-10-CM | POA: Diagnosis not present

## 2019-09-20 DIAGNOSIS — J438 Other emphysema: Secondary | ICD-10-CM

## 2019-09-20 MED ORDER — DOXYCYCLINE HYCLATE 100 MG PO TABS: 100.0000 mg | ORAL_TABLET | Freq: Two times a day (BID) | ORAL | 0 refills | Status: DC

## 2019-09-20 NOTE — Progress Notes (Signed)
Virtual Visit via Telephone Note  I connected with Taylor Burnett on 09/20/19 at 10:00 AM EDT by telephone and verified that I am speaking with the correct person using two identifiers.  Location: Patient: Home  Provider: Office    I discussed the limitations, risks, security and privacy concerns of performing an evaluation and management service by telephone and the availability of in person appointments. I also discussed with the patient that there may be a patient responsible charge related to this service. The patient expressed understanding and agreed to proceed.   History of Present Illness: 68 year old female former smoker followed for very severe COPD with associated hypoxic respiratory failure on oxygen 2 L with activity and at rest, chronic cough. She has sleep apnea but is intolerant to CPAP.  Today's tele-visit is for an acute office visit for cough and congestion.  Patient complains over the last 2 weeks has had increased cough with thick mucus mainly white to yellow.  She denies any chest pain orthopnea PND or leg swelling.  No loss of taste or smell.  No fever.  No known sick contacts.  Appetite is good with no nausea vomiting diarrhea.  She has no fever or body aches.  Patient has very severe COPD is on Brovana, Pulmicort twice daily and ipratropium nebulizers 3 times a day. Remains on oxygen with activity and bedtime.  O2 saturations at home have been good.  Greater than 90%.    Observations/Objective: TEST/EVENTS :  Bronchoscopy 12/31/2018 showed normal airways, normal posterior pharynx.  Bowel showed negative cytology.  Fungal smears were negative. Final AFB showed M gordonae   Assessment and Plan: COPD exacerbation Chronic respiratory failure continue on oxygen  Plan  Patient Instructions  Doxcycline 100mg  Twice daily, take with food.  Mucinex DM Twice daily  As needed  Cough/congestion  Continue on Brovana and Budesonide Nebs Twice daily   Continue on  Ipratropium Neb Three times a day   Continue on Oxygen.  Follow up with Dr. Lamonte Sakai as planned and As needed   Please contact office for sooner follow up if symptoms do not improve or worsen or seek emergency care       Follow Up Instructions: Follow-up with Dr. Lamonte Sakai in 2 months as planned or as needed  Please contact office for sooner follow up if symptoms do not improve or worsen or seek emergency care     I discussed the assessment and treatment plan with the patient. The patient was provided an opportunity to ask questions and all were answered. The patient agreed with the plan and demonstrated an understanding of the instructions.   The patient was advised to call back or seek an in-person evaluation if the symptoms worsen or if the condition fails to improve as anticipated.  I provided 21  minutes of non-face-to-face time during this encounter.   Rexene Edison, NP

## 2019-09-20 NOTE — Patient Instructions (Addendum)
Doxcycline 100mg  Twice daily, take with food.  Mucinex DM Twice daily  As needed  Cough/congestion  Continue on Brovana and Budesonide Nebs Twice daily   Continue on Ipratropium Neb Three times a day   Continue on Oxygen.  Follow up with Dr. Lamonte Sakai as planned and As needed   Please contact office for sooner follow up if symptoms do not improve or worsen or seek emergency care

## 2019-10-03 ENCOUNTER — Other Ambulatory Visit: Payer: Self-pay

## 2019-10-03 ENCOUNTER — Ambulatory Visit (INDEPENDENT_AMBULATORY_CARE_PROVIDER_SITE_OTHER): Payer: Medicare Other | Admitting: Family Medicine

## 2019-10-03 DIAGNOSIS — M545 Low back pain, unspecified: Secondary | ICD-10-CM

## 2019-10-03 DIAGNOSIS — R0602 Shortness of breath: Secondary | ICD-10-CM | POA: Diagnosis not present

## 2019-10-03 MED ORDER — DICLOFENAC SODIUM 75 MG PO TBEC: 75.0000 mg | DELAYED_RELEASE_TABLET | Freq: Two times a day (BID) | ORAL | 0 refills | Status: DC

## 2019-10-03 NOTE — Progress Notes (Signed)
Medicare Requirements for Oxygen Therapy   SpO2 ROOM AIR AT REST: 91%, HR 72 SpO2 ROOM AIR W/ EXERTION: 87%, HR 110 SpO2 ON O2 @ 3L/MIN VIA [place] W/ EXERTION: 93%, HR 100

## 2019-10-03 NOTE — Progress Notes (Signed)
Subjective:    Patient ID: Taylor Burnett, female    DOB: June 14, 1951, 68 y.o.   MRN: [id number]  HPI Patient presents with 2 concerns.  2 weeks ago, the patient was climbing on top of her washer to get into a cabinet.  She slipped getting off the washer and landed directly on her tailbone as she fell.  She experienced an electrical burning pain radiate up and down her spine from her tailbone to her head.  She felt like she was going to pass out.  Symptoms gradually subsided.  However she is now reporting constant pain in her lower lumbar spine and upper sacrum.  The pain is a deep aching pain.  She also has similar pain in her neck.  She denies any saddle anesthesia.  She denies any bowel or bladder incontinence.  She denies any lumbar radicular symptoms or cervical radicular symptoms.  She is tender to palpation over the sacrum as well as in her cervical spine.  Patient also reports worsening dyspnea on exertion.  For instance she gives the example of getting out of her bed to go to the bathroom which is less than 20 feet away.  She was not wearing her oxygen.  By the time she got to the bathroom and walked back to the bed she almost passed out due to dyspnea.  She reports feeling like she is smothering constantly.  Today on 3 L, the patient is 93%.  With ambulation off oxygen, she rapidly drops to 87% on room air.  After resuming 3 L via nasal cannula, the patient's oxygen saturation returned to 93%.  She is only been wearing the oxygen with strenuous activity however she now appears to be needing the oxygen 24 hours a day 7 days a week she denies any angina or chest pain or radicular symptoms down her left arm.  On exam there is no pitting edema although she does have some faint bibasilar crackles and diminished breath sounds throughout Past Medical History:  Diagnosis Date  . Arthritis   . Cataract   . COPD (chronic obstructive pulmonary disease) (HCC)   . Cough   . DDD (degenerative disc  disease), cervical   . Dysrhythmia    RAPID HEART BEAT OCC   . Emphysema of lung (HCC)   . GERD (gastroesophageal reflux disease)    OCC  . H/O degenerative disc disease   . Headache   . Myocardial infarction (HCC) 2016  . Osteoporosis   . Panic attacks   . Pre-diabetes   . Severe lung disease   . Shortness of breath dyspnea   . Sleep apnea   . Spondylosis    Past Surgical History:  Procedure Laterality Date  . ANTERIOR CERVICAL DECOMP/DISCECTOMY FUSION N/A 03/13/2015   Procedure: CERVICAL THREE-CERVICAL FOUR, CERVICAL FOUR-CERVICAL FIVE, CERVICAL FIVE-CERVICAL SIX ANTERIOR CERVICAL DECOMPRESSION/DISCECTOMY FUSION 3 LEVELS;  Surgeon: Tia Alert, MD;  Location: [facility];  Service: Neurosurgery;  Laterality: N/A;  . BREAST ENHANCEMENT SURGERY  1990  . CARDIAC CATHETERIZATION  1995  . CATARACT EXTRACTION W/PHACO Right 05/02/2017   Procedure: CATARACT EXTRACTION PHACO AND INTRAOCULAR LENS PLACEMENT (IOC);  Surgeon: Jethro Bolus, MD;  Location: [facility];  Service: Ophthalmology;  Laterality: Right;  CDE: 6.22  . CATARACT EXTRACTION W/PHACO Left 05/16/2017   Procedure: CATARACT EXTRACTION PHACO AND INTRAOCULAR LENS PLACEMENT (IOC);  Surgeon: Jethro Bolus, MD;  Location: [facility];  Service: Ophthalmology;  Laterality: Left;  CDE: 4.10  . TONSILLECTOMY    .  TUBAL LIGATION    . VIDEO BRONCHOSCOPY Bilateral 12/31/2018   Procedure: VIDEO BRONCHOSCOPY WITHOUT FLUORO;  Surgeon: Leslye Peer, MD;  Location: [facility];  Service: Cardiopulmonary;  Laterality: Bilateral;   Current Outpatient Medications on File Prior to Visit  Medication Sig Dispense Refill  . arformoterol (BROVANA) 15 MCG/2ML NEBU Take 2 mLs (15 mcg total) by nebulization 2 (two) times daily. Dx: J44.5 120 mL 5  . budesonide (PULMICORT) 0.25 MG/2ML nebulizer solution Take 2 mLs (0.25 mg total) by nebulization 2 (two) times daily. Dx: J44.5 120 mL 5  . cetirizine (ZYRTEC) 10 MG tablet Take 10 mg by mouth at bedtime.     .  Cholecalciferol (VITAMIN D) 2000 units CAPS Take 2,000 Units by mouth daily.     Marland Kitchen denosumab (PROLIA) 60 MG/ML SOSY injection Inject 60 mg into the skin every 6 (six) months.    . doxycycline (VIBRA-TABS) 100 MG tablet Take 1 tablet (100 mg total) by mouth 2 (two) times daily. 14 tablet 0  . esomeprazole (NEXIUM) 40 MG capsule Take 1 capsule (40 mg total) by mouth daily at 12 noon. 31 capsule 3  . fluticasone (FLONASE) 50 MCG/ACT nasal spray Place 2 sprays into both nostrils daily. (Patient taking differently: Place 2 sprays into both nostrils every evening. ) 16 g 2  . gabapentin (NEURONTIN) 100 MG capsule Take 1 capsule (100 mg total) by mouth at bedtime.    Marland Kitchen ipratropium (ATROVENT) 0.02 % nebulizer solution Take 2.5 mLs (0.5 mg total) by nebulization 3 (three) times daily. Dx: J44.5 225 mL 5  . Melatonin 10 MG TABS Take 10 mg by mouth at bedtime as needed.     . naproxen sodium (ALEVE) 220 MG tablet Take 440 mg by mouth 2 (two) times daily as needed (pain).     . OXYGEN Inhale 2 L into the lungs as needed. FOR USE WITH EXERTION    . PROAIR HFA 108 (90 Base) MCG/ACT inhaler INHALE 2 PUFFS INTO THE LUNGS EVERY 6 HOURS AS NEEDED. (Patient taking differently: Inhale 2 puffs into the lungs every 6 (six) hours as needed for wheezing or shortness of breath. ) 1 Inhaler 5   Current Facility-Administered Medications on File Prior to Visit  Medication Dose Route Frequency Provider Last Rate Last Dose  . denosumab (PROLIA) injection 60 mg  60 mg Subcutaneous Q6 months Donita Brooks, MD   60 mg at 02/13/19 1553   Allergies  Allergen Reactions  . Prednisone Other (See Comments)    Depression and suicidal   Social History   Socioeconomic History  . Marital status: Widowed    Spouse name: Not on file  . Number of children: Not on file  . Years of education: Not on file  . Highest education level: Not on file  Occupational History  . Occupation: on disability.  Social Needs  . Financial  resource strain: Not on file  . Food insecurity    Worry: Not on file    Inability: Not on file  . Transportation needs    Medical: Not on file    Non-medical: Not on file  Tobacco Use  . Smoking status: Former Smoker    Packs/day: 2.00    Years: 40.00    Pack years: 80.00    Types: Cigarettes    Quit date: 07/31/2013    Years since quitting: 6.1  . Smokeless tobacco: Never Used  Substance and Sexual Activity  . Alcohol use: No    Comment: OCC  WINE   . Drug use: No  . Sexual activity: Not on file  Lifestyle  . Physical activity    Days per week: Not on file    Minutes per session: Not on file  . Stress: Not on file  Relationships  . Social Herbalist on phone: Not on file    Gets together: Not on file    Attends religious service: Not on file    Active member of club or organization: Not on file    Attends meetings of clubs or organizations: Not on file    Relationship status: Not on file  . Intimate partner violence    Fear of current or ex partner: Not on file    Emotionally abused: Not on file    Physically abused: Not on file    Forced sexual activity: Not on file  Other Topics Concern  . Not on file  Social History Narrative   Divorced and lives alone.       Review of Systems  All other systems reviewed and are negative.      Objective:   Physical Exam Vitals signs reviewed.  Constitutional:      General: She is not in acute distress.    Appearance: She is well-developed. She is not diaphoretic.  HENT:     Head: Normocephalic and atraumatic.     Nose: Nose normal.  Eyes:     Conjunctiva/sclera: Conjunctivae normal.     Pupils: Pupils are equal, round, and reactive to light.  Neck:     Musculoskeletal: Neck supple.     Vascular: No JVD.  Cardiovascular:     Rate and Rhythm: Normal rate and regular rhythm.     Heart sounds: Normal heart sounds. No murmur. No friction rub. No gallop.   Pulmonary:     Effort: Pulmonary effort is normal.      Breath sounds: Decreased air movement present. Examination of the right-lower field reveals rhonchi. Examination of the left-lower field reveals rhonchi. Rhonchi and rales present. No wheezing.  Abdominal:     Palpations: Abdomen is soft.  Musculoskeletal:     Cervical back: She exhibits decreased range of motion, tenderness and pain. She exhibits no bony tenderness.     Lumbar back: She exhibits decreased range of motion, tenderness, bony tenderness and pain. She exhibits no swelling and no deformity.           Assessment & Plan:  Low back pain at multiple sites - Plan: DG Cervical Spine Complete, DG Lumbar Spine Complete  SOB (shortness of breath) - Plan: DG Chest 2 View  Begin diclofenac 75 mg twice daily for the pain in her back and neck and proceed with x-rays of the lumbar and cervical spine.  Given her dyspnea on exertion I will also obtain a chest x-ray to determine the etiology of the bibasilar crackles.  If there is evidence of pulmonary edema I would recommend an echocardiogram to evaluate for congestive heart failure.  If there is pneumonia I would begin antibiotics.  If it appears to be atelectasis, the patient has already maximized her therapy for COPD and is on Brovana, Atrovent 3-4 times a day, and Pulmicort.  Therefore I would recommend oxygen 24 hours a day 7 days a week.  Await the results of the chest x-ray.

## 2019-10-04 ENCOUNTER — Other Ambulatory Visit: Payer: Self-pay

## 2019-10-04 ENCOUNTER — Ambulatory Visit
Admission: RE | Admit: 2019-10-04 | Discharge: 2019-10-04 | Disposition: A | Discharge status: Home / Self Care | Payer: Medicare Other | Source: Ambulatory Visit | Attending: Family Medicine | Admitting: Family Medicine

## 2019-10-04 ENCOUNTER — Other Ambulatory Visit: Payer: Self-pay | Admitting: Family Medicine

## 2019-10-04 DIAGNOSIS — R05 Cough: Secondary | ICD-10-CM | POA: Diagnosis not present

## 2019-10-04 DIAGNOSIS — R0602 Shortness of breath: Secondary | ICD-10-CM

## 2019-10-04 DIAGNOSIS — M545 Low back pain, unspecified: Secondary | ICD-10-CM

## 2019-10-04 DIAGNOSIS — M542 Cervicalgia: Secondary | ICD-10-CM | POA: Diagnosis not present

## 2019-10-07 ENCOUNTER — Telehealth: Payer: Self-pay

## 2019-10-07 DIAGNOSIS — M542 Cervicalgia: Secondary | ICD-10-CM

## 2019-10-07 NOTE — Telephone Encounter (Signed)
Error

## 2019-10-08 ENCOUNTER — Encounter: Payer: Self-pay | Admitting: Family Medicine

## 2019-10-15 ENCOUNTER — Ambulatory Visit (HOSPITAL_COMMUNITY)
Admission: RE | Admit: 2019-10-15 | Discharge: 2019-10-15 | Disposition: A | Discharge status: Home / Self Care | Payer: Medicare Other | Source: Ambulatory Visit | Attending: Family Medicine | Admitting: Family Medicine

## 2019-10-15 ENCOUNTER — Other Ambulatory Visit: Payer: Self-pay

## 2019-10-15 DIAGNOSIS — M542 Cervicalgia: Secondary | ICD-10-CM

## 2019-11-07 NOTE — Telephone Encounter (Signed)
RB please advise. Thanks.  

## 2019-11-08 MED ORDER — TRELEGY ELLIPTA 100-62.5-25 MCG/INH IN AEPB: 1.0000 | INHALATION_SPRAY | Freq: Every day | RESPIRATORY_TRACT | 5 refills | Status: DC

## 2019-11-08 NOTE — Telephone Encounter (Signed)
It is ok with me for her to stop nebs, change over to trelegy once a day. I will admit I do not know whether she has coverage for this, what the cost might be.

## 2019-11-26 ENCOUNTER — Ambulatory Visit: Payer: Medicaid Other | Admitting: Emergency Medicine

## 2019-11-26 ENCOUNTER — Other Ambulatory Visit: Payer: Self-pay

## 2019-11-26 ENCOUNTER — Encounter: Payer: Self-pay | Admitting: Emergency Medicine

## 2019-11-26 ENCOUNTER — Ambulatory Visit (INDEPENDENT_AMBULATORY_CARE_PROVIDER_SITE_OTHER): Payer: Medicare Other | Admitting: Emergency Medicine

## 2019-11-26 DIAGNOSIS — J432 Centrilobular emphysema: Secondary | ICD-10-CM

## 2019-11-26 NOTE — Progress Notes (Signed)
Virtual Visit via Phone  Note  I connected with Taylor Burnett on 11/26/19 at  1:30 PM EST by phone and verified that I am speaking with the correct person using two identifiers.  Location: Patient: Home  Provider: Office   I discussed the limitations of evaluation and management by telemedicine and the availability of in person appointments. The patient expressed understanding and agreed to proceed.  History of Present Illness: 68 year old woman followed for hypoxemic respiratory failure in the setting of severe COPD and Mycobacterium gordonae colonization.  She also has untreated obstructive sleep apnea and chronic cough in the setting of allergic rhinitis and GERD.  She was treated for an acute bronchitis in September with doxycycline.   Observations/Objective: She tried Trelegy in early November to see if she would get more benefit as compared with her Brovana, Pulmicort, ipratropium nebs.  She reports that the Trelegy was ineffective, breathing changed significantly for the worse, had to use albuterol frequently. She is back to scheduled nebs now. She has had 1-2 occasions since last time when she had significantly more SOB. She still has a cough - less frequent. She is wearing her O2 with some exertion, follows her SpO2.   Assessment and Plan: She needs her flu shot - says she will arrange to get it Plan to continue ipratropium, Pulmicort, Brovana. Oxygen with most/all exertion.  She is following her SPO2.  Follow Up Instructions: By phone or in office in 2 to 3 months.   I discussed the assessment and treatment plan with the patient. The patient was provided an opportunity to ask questions and all were answered. The patient agreed with the plan and demonstrated an understanding of the instructions.   The patient was advised to call back or seek an in-person evaluation if the symptoms worsen or if the condition fails to improve as anticipated.  I provided 15 minutes of  non-face-to-face time during this encounter.   Collene Gobble, MD

## 2019-12-18 ENCOUNTER — Other Ambulatory Visit: Payer: Self-pay | Admitting: Family Medicine

## 2019-12-18 ENCOUNTER — Other Ambulatory Visit: Payer: Self-pay | Admitting: Emergency Medicine

## 2020-01-20 MED ORDER — ALBUTEROL SULFATE HFA 108 (90 BASE) MCG/ACT IN AERS: INHALATION_SPRAY | RESPIRATORY_TRACT | 5 refills | Status: DC

## 2020-03-02 ENCOUNTER — Ambulatory Visit (INDEPENDENT_AMBULATORY_CARE_PROVIDER_SITE_OTHER): Payer: Medicare Other | Admitting: Emergency Medicine

## 2020-03-02 ENCOUNTER — Encounter: Payer: Self-pay | Admitting: Emergency Medicine

## 2020-03-02 ENCOUNTER — Other Ambulatory Visit: Payer: Self-pay

## 2020-03-02 DIAGNOSIS — J438 Other emphysema: Secondary | ICD-10-CM | POA: Diagnosis not present

## 2020-03-02 DIAGNOSIS — J9611 Chronic respiratory failure with hypoxia: Secondary | ICD-10-CM

## 2020-03-02 MED ORDER — PREDNISONE 10 MG PO TABS: 10.0000 mg | ORAL_TABLET | Freq: Every day | ORAL | 0 refills | Status: DC

## 2020-03-02 NOTE — Assessment & Plan Note (Signed)
Severe disease with symptoms despite maximal bronchodilator therapy, supplemental oxygen.  Question whether she might benefit from low-dose prednisone.  She has tolerated poorly in the past-has had side effects of depression, suicidal ideation at higher doses.  Discussed with her in detail, revisited the pros and cons.  She understands potential risks for mood swings, even suicidal ideation.  She is willing to start prednisone 10 mg daily and I will order this today.  I want her to follow-up with me in a month, with either me or APP weekly for a few visits in order to ensure that she is not having any psychological side effects.  We will continue her same bronchodilator regimen  Please continue your Brovana, budesonide nebs, ipratropium nebs as you have been taking them. Keep albuterol available to use either one nebulizer treatment or 2 puffs up to every 4 hours if needed for shortness of breath, chest tightness, wheezing. We will start prednisone 10 mg daily until next visit to see if your breathing benefits.  You must be very diligent about keeping track of potential side effects since you have had difficulty with this medication at higher doses.  Call our office especially if you develop any evidence of depression, thoughts about harming yourself. We will follow-up with you weekly for a few visits in order to ensure that you are tolerating the prednisone without any mood changes.

## 2020-03-02 NOTE — Patient Instructions (Signed)
Please continue your Brovana, budesonide nebs, ipratropium nebs as you have been taking them. Keep albuterol available to use either one nebulizer treatment or 2 puffs up to every 4 hours if needed for shortness of breath, chest tightness, wheezing. Continue your oxygen at 2 L/min at rest, you can titrate up to 3 L/min with exertion.  Our goal is to keep your oxygen saturations greater than 90%. We will start prednisone 10 mg daily until next visit to see if your breathing benefits.  You must be very diligent about keeping track of potential side effects since you have had difficulty with this medication at higher doses.  Call our office especially if you develop any evidence of depression, thoughts about harming yourself. We will follow-up with you weekly for a few visits in order to ensure that you are tolerating the prednisone without any mood changes.

## 2020-03-02 NOTE — Assessment & Plan Note (Signed)
Continue your oxygen at 2 L/min at rest, you can titrate up to 3 L/min with exertion.  Our goal is to keep your oxygen saturations greater than 90%.

## 2020-03-02 NOTE — Progress Notes (Signed)
Subjective:    Patient ID: Taylor Burnett, female    DOB: 10/08/1951, 69 y.o.   MRN: [id number]  Shortness of Breath   ROV 07/30/2019 --69 year old woman with severe COPD, hypoxemic respiratory failure, untreated obstructive sleep apnea, Mycobacterium gordonae colonization.  She has chronic cough due to this as well as GERD, allergic rhinitis.  We changed her bronchodilators to nebulized form, currently on Brovana, Pulmicort, scheduled Atrovent 3 times daily.  She has albuterol which she uses approximately.  Her oxygen is set on. She describes episodic dyspnea, "feels that she is suffocating", can happen a few times a week. Not always associated with exertion. Can get some relief from her O2 and her SABA. No associated pain, cough, wheeze. Scares her and assoc with some anxiety. She has a daily cough, never produces mucous. She wears her O2 when she goes out at 3L/min, rarely wears it at home. She notes some desaturations during the day.  She remains on fluticasone nasal spray, Nexium twice daily, Zyrtec.   ROV 03/02/20 --follow-up visit for 69 year old woman with severe COPD, untreated obstructive sleep apnea, associated hypoxemic respiratory failure.  She is also colonized with Mycobacterium gordonae and has been followed for associated pulmonary nodular disease.  Her most recent CT chest was 02/25/2019, reviewed by me, shows 10 mm right middle lobe groundglass nodule as well as some other stable scattered micronodularity. Current bronchodilator regimen Brovana/budesonide nebs, scheduled ipratropium nebs tid.  Remains on Zyrtec, Flonase nasal spray, Nexium once daily.  She uses albuterol approximately. Her breathing has not changed significantly. Significant SOB both at rest and with exertion. She has chest / pectoral cramping occasionally. She can walk about 50 feet. Her O2 is at 2-3 L/min, wearing reliably with exertion. She has daily cough, non productive. She has daily wheeze. She has tolerated  prednisone poorly in the past - depression. Unclear whether she would tolerate a low dose.    Review of Systems  Respiratory: Positive for shortness of breath.   As per HPI      Objective:   Physical Exam Vitals:   03/02/20 1446  BP: 116/76  Pulse: 98  Temp: (!) 97.5 F (36.4 C)  TempSrc: Temporal  SpO2: 94%  Weight: 153 lb 6.4 oz (69.6 kg)  Height: 5' (1.524 m)   Gen: Pleasant, well-nourished, in no distress,  normal affect  ENT: No lesions,  mouth clear,  oropharynx clear, no postnasal drip  Neck: No JVD, no stridor  Lungs: No use of accessory muscles, distant, clear, no wheeze.  positive wheeze on forced expiration  Cardiovascular: RRR, heart sounds normal, no murmur or gallops, no peripheral edema  Musculoskeletal: No deformities, no cyanosis or clubbing  Neuro: alert, non focal  Skin: Warm, no lesions or rashes      Assessment & Plan:  COPD (chronic obstructive pulmonary disease) with emphysema (HCC) Severe disease with symptoms despite maximal bronchodilator therapy, supplemental oxygen.  Question whether she might benefit from low-dose prednisone.  She has tolerated poorly in the past-has had side effects of depression, suicidal ideation at higher doses.  Discussed with her in detail, revisited the pros and cons.  She understands potential risks for mood swings, even suicidal ideation.  She is willing to start prednisone 10 mg daily and I will order this today.  I want her to follow-up with me in a month, with either me or APP weekly for a few visits in order to ensure that she is not having any psychological side effects.  We will continue her same bronchodilator regimen  Please continue your Brovana, budesonide nebs, ipratropium nebs as you have been taking them. Keep albuterol available to use either one nebulizer treatment or 2 puffs up to every 4 hours if needed for shortness of breath, chest tightness, wheezing. We will start prednisone 10 mg daily until  next visit to see if your breathing benefits.  You must be very diligent about keeping track of potential side effects since you have had difficulty with this medication at higher doses.  Call our office especially if you develop any evidence of depression, thoughts about harming yourself. We will follow-up with you weekly for a few visits in order to ensure that you are tolerating the prednisone without any mood changes.  Chronic respiratory failure (HCC) Continue your oxygen at 2 L/min at rest, you can titrate up to 3 L/min with exertion.  Our goal is to keep your oxygen saturations greater than 90%.  Levy Pupa, MD, PhD 03/02/2020, 3:11 PM [facility] [phone] or if no answer [phone]

## 2020-03-09 ENCOUNTER — Encounter: Payer: Self-pay | Admitting: Adult Health

## 2020-03-09 ENCOUNTER — Ambulatory Visit (INDEPENDENT_AMBULATORY_CARE_PROVIDER_SITE_OTHER): Payer: Medicare Other | Admitting: Adult Health

## 2020-03-09 ENCOUNTER — Other Ambulatory Visit: Payer: Self-pay

## 2020-03-09 DIAGNOSIS — J438 Other emphysema: Secondary | ICD-10-CM

## 2020-03-09 DIAGNOSIS — F419 Anxiety disorder, unspecified: Secondary | ICD-10-CM | POA: Diagnosis not present

## 2020-03-09 DIAGNOSIS — J9611 Chronic respiratory failure with hypoxia: Secondary | ICD-10-CM

## 2020-03-09 NOTE — Assessment & Plan Note (Signed)
Flare on chronic steroids.  Advised on stress reducers.  Plan  Patient Instructions  Stop prednisone  Mucinex DM Twice daily  As needed  Cough/congestion  Continue on Brovana and Budesonide Nebs Twice daily   Continue on Ipratropium Neb Three times a day   Continue on Oxygen 3l/m rest and 4l/m with activity .  Follow up with Dr. Delton Coombes in 6 weeks and  As needed   Please contact office for sooner follow up if symptoms do not improve or worsen or seek emergency care

## 2020-03-09 NOTE — Patient Instructions (Addendum)
Stop prednisone  Mucinex DM Twice daily  As needed  Cough/congestion  Continue on Brovana and Budesonide Nebs Twice daily   Continue on Ipratropium Neb Three times a day   Continue on Oxygen 3l/m rest and 4l/m with activity .  Follow up with Dr. Delton Coombes in 6 weeks and  As needed   Please contact office for sooner follow up if symptoms do not improve or worsen or seek emergency care

## 2020-03-09 NOTE — Assessment & Plan Note (Signed)
Continue on oxygen 3 L with rest and 4 L with activity O2 saturation goals greater than 88 to 90%.  No increased oxygen demands.Marland Kitchen

## 2020-03-09 NOTE — Assessment & Plan Note (Signed)
Very severe COPD.  Patient has low pulmonary reserve.  Unfortunately she is unable to tolerate low-dose steroids due to depression and anxiety symptoms.  We discussed other options including Daliresp and possibly azithromycin however she is concerned about the side effect profile. Also discussed palliative care consult but she declines at this time. Discussed techniques to help with stress and anxiety.  Plan  Patient Instructions  Stop prednisone  Mucinex DM Twice daily  As needed  Cough/congestion  Continue on Brovana and Budesonide Nebs Twice daily   Continue on Ipratropium Neb Three times a day   Continue on Oxygen 3l/m rest and 4l/m with activity .  Follow up with Dr. Delton Coombes in 6 weeks and  As needed   Please contact office for sooner follow up if symptoms do not improve or worsen or seek emergency care

## 2020-03-09 NOTE — Progress Notes (Signed)
@Patient  ID: , female    DOB: 03/31/1951, 69 y.o.   MRN: [id number]  Chief Complaint  Patient presents with  . Follow-up    COPD    Referring provider: [id number], MD  HPI: 69 year old female former smoker followed for very severe COPD with hypoxic respiratory failure on oxygen.  She has underlying obstructive sleep apnea CPAP intolerant. 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  Chronic cough and allergic rhinitis.  TEST/EVENTS :  2017 FEV1 24%, DLCO 15%  03/09/2020 Follow up : COPD , O2 RF  Patient returns for a 1 week follow-up.  Patient was seen last visit for a COPD exacerbation.  She was having increased symptom burden.  Was started on prednisone low-dose at 10 mg daily. She says she can not tolerate. Causes her more anxiety and crying . Cut dose to 5mg  daily but still causes side effects. No suicidal ideations.  She denies increased cough or dyspnea. Has chronic dyspnea at baseline with minimal activities.  She remains on Brovana and budesonide nebulizers twice daily.  Ipratropium nebulizers 3 times daily.  She has tried to increase her ipratropium to 4 times a day but says that she does not tolerate the side effects.  And does not feel like it really helps either.  She has done pulmonary rehab in the past.  We discussed stress reducers and tips to help with her anxiety.  She denies any weight loss.  Appetite is good.  Allergies  Allergen Reactions  . Prednisone Other (See Comments)    Depression and suicidal    Immunization History  Administered Date(s) Administered  . Pneumococcal Conjugate-13 07/30/2019  . Pneumococcal Polysaccharide-23 04/20/2018  . Tdap 06/06/2016    Past Medical History:  Diagnosis Date  . Arthritis   . Cataract   . COPD (chronic obstructive pulmonary disease) (HCC)   . Cough   . DDD (degenerative disc disease), cervical   . Dysrhythmia    RAPID HEART BEAT OCC    . Emphysema of lung (HCC)   . GERD (gastroesophageal reflux disease)    OCC  . H/O degenerative disc disease   . Headache   . Myocardial infarction (HCC) 2016  . Osteoporosis   . Panic attacks   . Pre-diabetes   . Severe lung disease   . Shortness of breath dyspnea   . Sleep apnea   . Spondylosis     Tobacco History: Social History   Tobacco Use  Smoking Status Former Smoker  . Packs/day: 2.00  . Years: 40.00  . Pack years: 80.00  . Types: Cigarettes  . Quit date: 07/31/2013  . Years since quitting: 6.6  Smokeless Tobacco Never Used   Counseling given: Not Answered   Outpatient Medications Prior to Visit  Medication Sig Dispense Refill  . albuterol (PROAIR HFA) 108 (90 Base) MCG/ACT inhaler INHALE 2 PUFFS INTO THE LUNGS EVERY 6 HOURS AS NEEDED. 18 g 5  . arformoterol (BROVANA) 15 MCG/2ML NEBU Take 2 mLs (15 mcg total) by nebulization 2 (two) times daily. Dx: J44.5 120 mL 5  . budesonide (PULMICORT) 0.25 MG/2ML nebulizer solution Take 2 mLs (0.25 mg total) by nebulization 2 (two) times daily. Dx: J44.5 120 mL 5  . cetirizine (ZYRTEC) 10 MG tablet Take 10 mg by mouth at bedtime.     . Cholecalciferol (VITAMIN D) 2000 units CAPS Take 2,000 Units by mouth daily.     2017 esomeprazole (NEXIUM) 40 MG capsule Take 1  capsule (40 mg total) by mouth daily at 12 noon. 31 capsule 3  . fluticasone (FLONASE) 50 MCG/ACT nasal spray Place 2 sprays into both nostrils every evening. 16 mL 12  . gabapentin (NEURONTIN) 100 MG capsule TAKE 1 CAPSULE BY MOUTH 3 TIMES DAILY. 90 capsule 3  . ipratropium (ATROVENT) 0.02 % nebulizer solution Take 2.5 mLs (0.5 mg total) by nebulization 3 (three) times daily. Dx: J44.5 225 mL 5  . naproxen sodium (ALEVE) 220 MG tablet Take 440 mg by mouth 2 (two) times daily as needed (pain).     . OXYGEN Inhale 2 L into the lungs as needed. FOR USE WITH EXERTION    . predniSONE (DELTASONE) 10 MG tablet Take 1 tablet (10 mg total) by mouth daily with breakfast. 31 tablet  0  . denosumab (PROLIA) 60 MG/ML SOSY injection Inject 60 mg into the skin every 6 (six) months.    . Melatonin 10 MG TABS Take 10 mg by mouth at bedtime as needed.      Facility-Administered Medications Prior to Visit  Medication Dose Route Frequency Provider Last Rate Last Admin  . denosumab (PROLIA) injection 60 mg  60 mg Subcutaneous Q6 months Susy Frizzle, MD   60 mg at 02/13/19 1553     Review of Systems:   Constitutional:   No  weight loss, night sweats,  Fevers, chills,  +fatigue, or  lassitude.  HEENT:   No headaches,  Difficulty swallowing,  Tooth/dental problems, or  Sore throat,                No sneezing, itching, ear ache, nasal congestion, post nasal drip,   CV:  No chest pain,  Orthopnea, PND, swelling in lower extremities, anasarca, dizziness, palpitations, syncope.   GI  No heartburn, indigestion, abdominal pain, nausea, vomiting, diarrhea, change in bowel habits, loss of appetite, bloody stools.   Resp: .  No chest wall deformity  Skin: no rash or lesions.  GU: no dysuria, change in color of urine, no urgency or frequency.  No flank pain, no hematuria   MS:  No joint pain or swelling.  No decreased range of motion.  No back pain.    Physical Exam  BP 103/70 (BP Location: Left Arm, Cuff Size: Normal)   Pulse 90   Temp (!) 97.4 F (36.3 C) (Temporal)   Ht 5' (1.524 m)   Wt 152 lb 12.8 oz (69.3 kg)   SpO2 93% Comment: RA  BMI 29.84 kg/m   GEN: A/Ox3; pleasant , NAD, elderly on oxygen   HEENT:  Francis/AT,   NOSE-clear, THROAT-clear, no lesions, no postnasal drip or exudate noted.   NECK:  Supple w/ fair ROM; no JVD; normal carotid impulses w/o bruits; no thyromegaly or nodules palpated; no lymphadenopathy.    RESP  C decreased breath sounds in the bases  w/o, wheezes/ rales/ or rhonchi. no accessory muscle use, no dullness to percussion  CARD:  RRR, no m/r/g, no peripheral edema, pulses intact, no cyanosis or clubbing.  GI:   Soft & nt; nml bowel  sounds; no organomegaly or masses detected.   Musco: Warm bil, no deformities or joint swelling noted.   Neuro: alert, no focal deficits noted.    Skin: Warm, no lesions or rashes    Lab Results:   BNP No results found for: BNP  ProBNP No results found for: PROBNP  Imaging: No results found.    PFT Results Latest Ref Rng & Units 11/14/2016  FVC-Pre  L 1.38  FVC-Predicted Pre % 47  FVC-Post L 1.10  FVC-Predicted Post % 37  Pre FEV1/FVC % % 42  Post FEV1/FCV % % 51  FEV1-Pre L 0.58  FEV1-Predicted Pre % 25  FEV1-Post L 0.56  DLCO UNC% % 15  DLCO COR %Predicted % 40  TLC L 5.04  TLC % Predicted % 106  RV % Predicted % 186    No results found for: NITRICOXIDE      Assessment & Plan:   COPD (chronic obstructive pulmonary disease) with emphysema (HCC) Very severe COPD.  Patient has low pulmonary reserve.  Unfortunately she is unable to tolerate low-dose steroids due to depression and anxiety symptoms.  We discussed other options including Daliresp and possibly azithromycin however she is concerned about the side effect profile. Also discussed palliative care consult but she declines at this time. Discussed techniques to help with stress and anxiety.  Plan  Patient Instructions  Stop prednisone  Mucinex DM Twice daily  As needed  Cough/congestion  Continue on Brovana and Budesonide Nebs Twice daily   Continue on Ipratropium Neb Three times a day   Continue on Oxygen 3l/m rest and 4l/m with activity .  Follow up with Dr. Delton Coombes in 6 weeks and  As needed   Please contact office for sooner follow up if symptoms do not improve or worsen or seek emergency care       Chronic respiratory failure (HCC) Continue on oxygen 3 L with rest and 4 L with activity O2 saturation goals greater than 88 to 90%.  No increased oxygen demands..  Anxiety Flare on chronic steroids.  Advised on stress reducers.  Plan  Patient Instructions  Stop prednisone  Mucinex DM Twice  daily  As needed  Cough/congestion  Continue on Brovana and Budesonide Nebs Twice daily   Continue on Ipratropium Neb Three times a day   Continue on Oxygen 3l/m rest and 4l/m with activity .  Follow up with Dr. Delton Coombes in 6 weeks and  As needed   Please contact office for sooner follow up if symptoms do not improve or worsen or seek emergency care          [place], NP 03/09/2020

## 2020-03-23 ENCOUNTER — Ambulatory Visit: Payer: Medicaid Other | Admitting: Adult Health

## 2020-03-23 ENCOUNTER — Encounter: Payer: Self-pay | Admitting: Family Medicine

## 2020-03-23 NOTE — Telephone Encounter (Signed)
Called and spoke with patient about Brians recs...  Given the fact the patient is having increased pain in her upper back and rib cage I would recommend that she is evaluated at an urgent care or emergency room.  She also can follow-up with primary care to see if they can see her in office.  We do not have any open available slots today for the patient to be evaluated acutely.  Elisha Headland, FNP  Patient expressed understanding. Nothing further needed at this time.

## 2020-03-23 NOTE — Telephone Encounter (Signed)
Brain please advise on patient mychart message:  Since Friday I have been experiencing pain in my upper back and rib cage. It is worse when I am mobile. I feel pain when I breathe. I have been taking Aleve and using a heating pad but I'm still experiencing pain. I'm not sure what it is or who to see. What I'm doing hasn't helped it unless I'm sitting.

## 2020-03-23 NOTE — Telephone Encounter (Signed)
03/23/2020  Given the fact the patient is having increased pain in her upper back and rib cage I would recommend that she is evaluated at an urgent care or emergency room.  She also can follow-up with primary care to see if they can see her in office.  We do not have any open available slots today for the patient to be evaluated acutely.  Elisha Headland, FNP

## 2020-03-26 ENCOUNTER — Ambulatory Visit (INDEPENDENT_AMBULATORY_CARE_PROVIDER_SITE_OTHER): Payer: Medicare Other | Admitting: Family Medicine

## 2020-03-26 ENCOUNTER — Other Ambulatory Visit: Payer: Self-pay

## 2020-03-26 ENCOUNTER — Encounter: Payer: Self-pay | Admitting: Family Medicine

## 2020-03-26 VITALS — BP 110/64 | HR 91 | Temp 96.5°F | Resp 18 | Ht 63.0 in | Wt 149.0 lb

## 2020-03-26 DIAGNOSIS — R0781 Pleurodynia: Secondary | ICD-10-CM

## 2020-03-26 DIAGNOSIS — M549 Dorsalgia, unspecified: Secondary | ICD-10-CM | POA: Diagnosis not present

## 2020-03-26 MED ORDER — HYDROCODONE-ACETAMINOPHEN 5-325 MG PO TABS: 1.0000 | ORAL_TABLET | Freq: Four times a day (QID) | ORAL | 0 refills | Status: DC | PRN

## 2020-03-26 NOTE — Progress Notes (Signed)
Subjective:    Patient ID: Taylor Burnett, female    DOB: [birth year], 69 y.o.   MRN: [id number]  HPI Patient has chronic obstructive pulmonary disease on oxygen therapy.  She has a history of chronic coughing as well.  Over the last week she has had pain in the middle of her back roughly at the level of T12.  The pain radiates around both sides following the path of the ribs to the center of her chest in the front near the xiphoid process.  It hurts to cough.  It hurts to take a deep breath in.  She hurts when I press on either rib.  There is no shingles rash.  There is no bruising or erythema.  She denies any falls or injuries.  The pain began gradually 1 week ago.  She denies any worsening shortness of breath.  She denies any angina.  She denies any hemoptysis.  The pleurisy is more of a musculoskeletal pain when she takes a deep inspiration. Past Medical History:  Diagnosis Date  . Arthritis   . Cataract   . COPD (chronic obstructive pulmonary disease) (HCC)   . Cough   . DDD (degenerative disc disease), cervical   . Dysrhythmia    RAPID HEART BEAT OCC   . Emphysema of lung (HCC)   . GERD (gastroesophageal reflux disease)    OCC  . H/O degenerative disc disease   . Headache   . Myocardial infarction (HCC) 2016  . Osteoporosis   . Panic attacks   . Pre-diabetes   . Severe lung disease   . Shortness of breath dyspnea   . Sleep apnea   . Spondylosis    Past Surgical History:  Procedure Laterality Date  . ANTERIOR CERVICAL DECOMP/DISCECTOMY FUSION N/A 03/13/2015   Procedure: CERVICAL THREE-CERVICAL FOUR, CERVICAL FOUR-CERVICAL FIVE, CERVICAL FIVE-CERVICAL SIX ANTERIOR CERVICAL DECOMPRESSION/DISCECTOMY FUSION 3 LEVELS;  Surgeon: Tia Alert, MD;  Location: [facility];  Service: Neurosurgery;  Laterality: N/A;  . BREAST ENHANCEMENT SURGERY  1990  . CARDIAC CATHETERIZATION  1995  . CATARACT EXTRACTION W/PHACO Right 05/02/2017   Procedure: CATARACT EXTRACTION PHACO AND INTRAOCULAR  LENS PLACEMENT (IOC);  Surgeon: Jethro Bolus, MD;  Location: [facility];  Service: Ophthalmology;  Laterality: Right;  CDE: 6.22  . CATARACT EXTRACTION W/PHACO Left 05/16/2017   Procedure: CATARACT EXTRACTION PHACO AND INTRAOCULAR LENS PLACEMENT (IOC);  Surgeon: Jethro Bolus, MD;  Location: [facility];  Service: Ophthalmology;  Laterality: Left;  CDE: 4.10  . TONSILLECTOMY    . TUBAL LIGATION    . VIDEO BRONCHOSCOPY Bilateral 12/31/2018   Procedure: VIDEO BRONCHOSCOPY WITHOUT FLUORO;  Surgeon: Leslye Peer, MD;  Location: [facility];  Service: Cardiopulmonary;  Laterality: Bilateral;   Current Outpatient Medications on File Prior to Visit  Medication Sig Dispense Refill  . albuterol (PROAIR HFA) 108 (90 Base) MCG/ACT inhaler INHALE 2 PUFFS INTO THE LUNGS EVERY 6 HOURS AS NEEDED. 18 g 5  . arformoterol (BROVANA) 15 MCG/2ML NEBU Take 2 mLs (15 mcg total) by nebulization 2 (two) times daily. Dx: J44.5 120 mL 5  . budesonide (PULMICORT) 0.25 MG/2ML nebulizer solution Take 2 mLs (0.25 mg total) by nebulization 2 (two) times daily. Dx: J44.5 120 mL 5  . cetirizine (ZYRTEC) 10 MG tablet Take 10 mg by mouth at bedtime.     . Cholecalciferol (VITAMIN D) 2000 units CAPS Take 2,000 Units by mouth daily.     Marland Kitchen denosumab (PROLIA) 60 MG/ML SOSY injection Inject 60 mg  into the skin every 6 (six) months.    . esomeprazole (NEXIUM) 40 MG capsule Take 1 capsule (40 mg total) by mouth daily at 12 noon. 31 capsule 3  . fluticasone (FLONASE) 50 MCG/ACT nasal spray Place 2 sprays into both nostrils every evening. 16 mL 12  . gabapentin (NEURONTIN) 100 MG capsule TAKE 1 CAPSULE BY MOUTH 3 TIMES DAILY. 90 capsule 3  . ipratropium (ATROVENT) 0.02 % nebulizer solution Take 2.5 mLs (0.5 mg total) by nebulization 3 (three) times daily. Dx: J44.5 225 mL 5  . Melatonin 10 MG TABS Take 10 mg by mouth at bedtime as needed.     . naproxen sodium (ALEVE) 220 MG tablet Take 440 mg by mouth 2 (two) times daily as needed (pain).     .  OXYGEN Inhale 2 L into the lungs as needed. FOR USE WITH EXERTION     Current Facility-Administered Medications on File Prior to Visit  Medication Dose Route Frequency Provider Last Rate Last Admin  . denosumab (PROLIA) injection 60 mg  60 mg Subcutaneous Q6 months Donita Brooks, MD   60 mg at 02/13/19 1553   Allergies  Allergen Reactions  . Prednisone Other (See Comments)    Depression and suicidal   Social History   Socioeconomic History  . Marital status: Widowed    Spouse name: Not on file  . Number of children: Not on file  . Years of education: Not on file  . Highest education level: Not on file  Occupational History  . Occupation: on disability.  Tobacco Use  . Smoking status: Former Smoker    Packs/day: 2.00    Years: 40.00    Pack years: 80.00    Types: Cigarettes    Quit date: 07/31/2013    Years since quitting: 6.6  . Smokeless tobacco: Never Used  Substance and Sexual Activity  . Alcohol use: No    Comment: OCC WINE   . Drug use: No  . Sexual activity: Not on file  Other Topics Concern  . Not on file  Social History Narrative   Divorced and lives alone.    Social Determinants of Health   Financial Resource Strain:   . Difficulty of Paying Living Expenses:   Food Insecurity:   . Worried About Programme researcher, broadcasting/film/video in the Last Year:   . Barista in the Last Year:   Transportation Needs:   . Freight forwarder (Medical):   Marland Kitchen Lack of Transportation (Non-Medical):   Physical Activity:   . Days of Exercise per Week:   . Minutes of Exercise per Session:   Stress:   . Feeling of Stress :   Social Connections:   . Frequency of Communication with Friends and Family:   . Frequency of Social Gatherings with Friends and Family:   . Attends Religious Services:   . Active Member of Clubs or Organizations:   . Attends Banker Meetings:   Marland Kitchen Marital Status:   Intimate Partner Violence:   . Fear of Current or Ex-Partner:   . Emotionally  Abused:   Marland Kitchen Physically Abused:   . Sexually Abused:       Review of Systems  Musculoskeletal: Positive for back pain.  All other systems reviewed and are negative.      Objective:   Physical Exam Vitals reviewed.  Constitutional:      General: She is not in acute distress.    Appearance: She is well-developed. She is  not diaphoretic.  HENT:     Head: Normocephalic and atraumatic.     Nose: Nose normal.  Eyes:     Conjunctiva/sclera: Conjunctivae normal.     Pupils: Pupils are equal, round, and reactive to light.  Neck:     Vascular: No JVD.  Cardiovascular:     Rate and Rhythm: Normal rate and regular rhythm.     Heart sounds: Normal heart sounds. No murmur. No friction rub. No gallop.   Pulmonary:     Effort: Pulmonary effort is normal.     Breath sounds: Decreased air movement present. Rales present. No wheezing or rhonchi.    Chest:     Chest wall: Tenderness present. No mass, lacerations, deformity, swelling, crepitus or edema. There is no dullness to percussion.    Abdominal:     Palpations: Abdomen is soft.  Musculoskeletal:     Cervical back: Neck supple. No bony tenderness.     Lumbar back: Tenderness and bony tenderness present. No swelling or deformity. Decreased range of motion.           Assessment & Plan:  Rib pain on left side  Rib pain on right side  Mid back pain  I do not believe that the patient has pneumonia or pulmonary embolism or cardiac cause of her chest pain and rib pain and back pain.  I believe most likely she has strain in intercostal muscle or potentially cracked rib cough..  Also the differential diagnosis would be a vertebral fracture.  I will treat the patient symptomatically with Norco 5/325 1 p.o. every 6 hours.  If the pain is no better by next week I would recommend x-rays of the ribs and the thoracic spine.  Seek medical attention immediately if she develops hemoptysis or worsening shortness of breath

## 2020-04-22 ENCOUNTER — Other Ambulatory Visit: Payer: Self-pay

## 2020-04-22 ENCOUNTER — Ambulatory Visit (INDEPENDENT_AMBULATORY_CARE_PROVIDER_SITE_OTHER): Payer: Medicare Other | Admitting: Emergency Medicine

## 2020-04-22 ENCOUNTER — Encounter: Payer: Self-pay | Admitting: Emergency Medicine

## 2020-04-22 ENCOUNTER — Telehealth: Payer: Self-pay | Admitting: Emergency Medicine

## 2020-04-22 VITALS — BP 120/70 | HR 112 | Temp 97.2°F | Ht 60.0 in | Wt 145.6 lb

## 2020-04-22 DIAGNOSIS — J9611 Chronic respiratory failure with hypoxia: Secondary | ICD-10-CM

## 2020-04-22 DIAGNOSIS — J438 Other emphysema: Secondary | ICD-10-CM | POA: Diagnosis not present

## 2020-04-22 DIAGNOSIS — A319 Mycobacterial infection, unspecified: Secondary | ICD-10-CM | POA: Diagnosis not present

## 2020-04-22 DIAGNOSIS — J441 Chronic obstructive pulmonary disease with (acute) exacerbation: Secondary | ICD-10-CM | POA: Diagnosis not present

## 2020-04-22 DIAGNOSIS — A318 Other mycobacterial infections: Secondary | ICD-10-CM

## 2020-04-22 MED ORDER — AZITHROMYCIN 250 MG PO TABS: 250.0000 mg | ORAL_TABLET | Freq: Once | ORAL | 0 refills | Status: AC

## 2020-04-22 MED ORDER — DOXYCYCLINE HYCLATE 100 MG PO TABS: 100.0000 mg | ORAL_TABLET | Freq: Two times a day (BID) | ORAL | 0 refills | Status: DC

## 2020-04-22 MED ORDER — CEFUROXIME AXETIL 250 MG/5ML PO SUSR: 250.0000 mg | Freq: Two times a day (BID) | ORAL | Status: DC

## 2020-04-22 NOTE — Assessment & Plan Note (Signed)
In absence of any significant clinical change plan to repeat her CT chest in March 2022 for interval stability

## 2020-04-22 NOTE — Patient Instructions (Signed)
Agree with stopping prednisone.  We will not plan to restart at this time given your significant side effects. Continue your budesonide, Brovana, ipratropium nebulizers as you are taking them. Use albuterol either 2 puffs or 1 nebulizer treatment up to every 4 hours if needed for shortness of breath, chest tightness, wheeze. We will start rotating antibiotics for the first week of every month as follows: -Doxycycline 100 mg twice a day for 7 days -Azithromycin, Z-Pak (5 days) -Cefuroxime 250 mg twice a day for 7 days You would benefit from getting the COVID-19 vaccine.  I encouraged her to do this. Continue your oxygen at 2 to 3 L/min Follow with Dr Delton Coombes in 4 months or sooner if you have any problems.

## 2020-04-22 NOTE — Assessment & Plan Note (Signed)
Clearly did not tolerate prednisone, depressive symptoms or significant.  She is now off.  She does not have much in the way of purulent sputum, chronic bronchitic symptoms but question whether she may benefit from cycle antibiotics to some degree.  I will order and we will revisit after several months to see if there is been any benefit.  Agree with stopping prednisone.  We will not plan to restart at this time given your significant side effects. Continue your budesonide, Brovana, ipratropium nebulizers as you are taking them. Use albuterol either 2 puffs or 1 nebulizer treatment up to every 4 hours if needed for shortness of breath, chest tightness, wheeze. We will start rotating antibiotics for the first week of every month as follows: -Doxycycline 100 mg twice a day for 7 days -Azithromycin, Z-Pak (5 days) -Cefuroxime 250 mg twice a day for 7 days You would benefit from getting the COVID-19 vaccine.  I encouraged her to do this. Follow with Dr Delton Coombes in 4 months or sooner if you have any problems.

## 2020-04-22 NOTE — Progress Notes (Signed)
Subjective:    Patient ID: Taylor Burnett, female    DOB: 05-Jun-1951, 69 y.o.   MRN: [id number]  Shortness of Breath   ROV 07/30/2019 --69 year old woman with severe COPD, hypoxemic respiratory failure, untreated obstructive sleep apnea, Mycobacterium gordonae colonization.  She has chronic cough due to this as well as GERD, allergic rhinitis.  We changed her bronchodilators to nebulized form, currently on Brovana, Pulmicort, scheduled Atrovent 3 times daily.  She has albuterol which she uses approximately.  Her oxygen is set on. She describes episodic dyspnea, "feels that she is suffocating", can happen a few times a week. Not always associated with exertion. Can get some relief from her O2 and her SABA. No associated pain, cough, wheeze. Scares her and assoc with some anxiety. She has a daily cough, never produces mucous. She wears her O2 when she goes out at 3L/min, rarely wears it at home. She notes some desaturations during the day.  She remains on fluticasone nasal spray, Nexium twice daily, Zyrtec.   ROV 03/02/20 --follow-up visit for 69 year old woman with severe COPD, untreated obstructive sleep apnea, associated hypoxemic respiratory failure.  She is also colonized with Mycobacterium gordonae and has been followed for associated pulmonary nodular disease.  Her most recent CT chest was 02/25/2019, reviewed by me, shows 10 mm right middle lobe groundglass nodule as well as some other stable scattered micronodularity. Current bronchodilator regimen Brovana/budesonide nebs, scheduled ipratropium nebs tid.  Remains on Zyrtec, Flonase nasal spray, Nexium once daily.  She uses albuterol approximately. Her breathing has not changed significantly. Significant SOB both at rest and with exertion. She has chest / pectoral cramping occasionally. She can walk about 50 feet. Her O2 is at 2-3 L/min, wearing reliably with exertion. She has daily cough, non productive. She has daily wheeze. She has tolerated  prednisone poorly in the past - depression. Unclear whether she would tolerate a low dose.   ROV 04/22/20 --follow-up visit for 69 year old woman with very severe COPD, untreated obstructive sleep apnea, chronic hypoxemic respiratory failure who is colonized with Mycobacterium gordonae and has associated pulmonary nodular disease.  At her last office visit based on her persistent dyspnea even at rest we started prednisone 10 mg daily.  She unfortunately experienced depressive side effects, so it was stopped. She has minimal cough, no sputum. occasional wheeze Bronchodilator regimen includes Brovana, budesonide, scheduled ipratropium.  Her oxygen is at 2-3 l/min at all times.   Review of Systems  Respiratory: Positive for shortness of breath.   As per HPI      Objective:   Physical Exam Vitals:   04/22/20 1507  BP: 120/70  Pulse: (!) 112  Temp: (!) 97.2 F (36.2 C)  TempSrc: Temporal  SpO2: 99%  Weight: 145 lb 9.6 oz (66 kg)  Height: 5' (1.524 m)   Gen: Pleasant, well-nourished, in no distress,  normal affect  ENT: No lesions,  mouth clear,  oropharynx clear, no postnasal drip  Neck: No JVD, no stridor  Lungs: No use of accessory muscles, distant, clear, no wheeze on normal breath.   Cardiovascular: RRR, heart sounds normal, no murmur or gallops, no peripheral edema  Musculoskeletal: No deformities, no cyanosis or clubbing  Neuro: alert, non focal  Skin: Warm, no lesions or rashes      Assessment & Plan:  COPD (chronic obstructive pulmonary disease) with emphysema (HCC) Clearly did not tolerate prednisone, depressive symptoms or significant.  She is now off.  She does not have much in the way  of purulent sputum, chronic bronchitic symptoms but question whether she may benefit from cycle antibiotics to some degree.  I will order and we will revisit after several months to see if there is been any benefit.  Agree with stopping prednisone.  We will not plan to restart at  this time given your significant side effects. Continue your budesonide, Brovana, ipratropium nebulizers as you are taking them. Use albuterol either 2 puffs or 1 nebulizer treatment up to every 4 hours if needed for shortness of breath, chest tightness, wheeze. We will start rotating antibiotics for the first week of every month as follows: -Doxycycline 100 mg twice a day for 7 days -Azithromycin, Z-Pak (5 days) -Cefuroxime 250 mg twice a day for 7 days You would benefit from getting the COVID-19 vaccine.  I encouraged her to do this. Follow with Dr Delton Coombes in 4 months or sooner if you have any problems.  Chronic respiratory failure (HCC) Continue your oxygen at 2 to 3 L/min  Mycobacterium gordonae infection In absence of any significant clinical change plan to repeat her CT chest in March 2022 for interval stability  Levy Pupa, MD, PhD 04/22/2020, 3:29 PM [facility] [phone] or if no answer [phone]

## 2020-04-22 NOTE — Assessment & Plan Note (Signed)
Continue your oxygen at 2 to 3 L/min.  

## 2020-04-22 NOTE — Telephone Encounter (Signed)
Needs to be tablet form > 250mg  bid x 7 days

## 2020-04-22 NOTE — Telephone Encounter (Signed)
Spoke with [employer] at pharmacy. She was calling in regards to the multiple antibiotics that were called in for the patient. She stated that it seems like some months were missing from the sigs. I read the instructions from the AVS and we discovered that the Ceftin did not go to the pharmacy.   Ceftin has been ordered as 250mg /46mL. She stated that they have this medication in a tablet form, not a solution.   RB, please advise on the Ceftin and how often she will need to take this. Thanks!

## 2020-04-23 MED ORDER — CEFUROXIME AXETIL 250 MG PO TABS: 250.0000 mg | ORAL_TABLET | Freq: Two times a day (BID) | ORAL | 0 refills | Status: AC

## 2020-04-23 MED ORDER — CEFUROXIME AXETIL 250 MG PO TABS: 250.0000 mg | ORAL_TABLET | Freq: Two times a day (BID) | ORAL | 0 refills | Status: DC

## 2020-04-23 NOTE — Telephone Encounter (Signed)
ATC x 2 pharmacy line busy will try again later.

## 2020-04-23 NOTE — Telephone Encounter (Signed)
New RX sent in for Ceftin 250mg  BID X 7 days. Nothing further needed at this time

## 2020-04-24 ENCOUNTER — Other Ambulatory Visit: Payer: Self-pay | Admitting: Adult Health

## 2020-04-24 DIAGNOSIS — R053 Chronic cough: Secondary | ICD-10-CM

## 2020-04-24 DIAGNOSIS — R05 Cough: Secondary | ICD-10-CM

## 2020-04-24 DIAGNOSIS — J438 Other emphysema: Secondary | ICD-10-CM

## 2020-04-28 ENCOUNTER — Telehealth: Payer: Self-pay | Admitting: Emergency Medicine

## 2020-04-28 ENCOUNTER — Telehealth: Payer: Self-pay | Admitting: Family Medicine

## 2020-04-28 DIAGNOSIS — R053 Chronic cough: Secondary | ICD-10-CM

## 2020-04-28 DIAGNOSIS — J438 Other emphysema: Secondary | ICD-10-CM

## 2020-04-28 DIAGNOSIS — R05 Cough: Secondary | ICD-10-CM

## 2020-04-28 MED ORDER — IPRATROPIUM BROMIDE 0.02 % IN SOLN: RESPIRATORY_TRACT | 11 refills | Status: DC

## 2020-04-28 NOTE — Chronic Care Management (AMB) (Signed)
°  Chronic Care Management   Outreach Note  04/28/2020 Name: Taylor Burnett MRN: [id number] DOB: [birth year]  Referred by: Donita Brooks, MD Reason for referral : Chronic Care Management   An unsuccessful telephone outreach was attempted today. The patient was referred to the pharmacist for assistance with care management and care coordination.   Follow Up Plan:   Myra Cody  Upstream Scheduler

## 2020-04-28 NOTE — Telephone Encounter (Signed)
Spoke with pt, states she needs Atrovent neb solution sent to [facility] pharmacy/[facility].  This has been sent with dx code attached.  Nothing further needed at this time- will close encounter.

## 2020-04-29 ENCOUNTER — Telehealth: Payer: Self-pay | Admitting: Family Medicine

## 2020-04-29 NOTE — Chronic Care Management (AMB) (Signed)
  Chronic Care Management   Note  04/29/2020 Name: Taylor Burnett MRN: [id number] DOB: 02-05-1951  Taylor Burnett is a 69 y.o. year old female who is a primary care patient of Tanya Nones, Priscille Heidelberg, MD. I reached out to Shirlee More by phone today in response to a referral sent by Taylor Burnett's PCP, Donita Brooks, MD.   Taylor Burnett was given information about Chronic Care Management services today including:  1. CCM service includes personalized support from designated clinical staff supervised by her physician, including individualized plan of care and coordination with other care providers 2. 24/7 contact phone numbers for assistance for urgent and routine care needs. 3. Service will only be billed when office clinical staff spend 20 minutes or more in a month to coordinate care. 4. Only one practitioner may furnish and bill the service in a calendar month. 5. The patient may stop CCM services at any time (effective at the end of the month) by phone call to the office staff.   Patient agreed to services and verbal consent obtained.   Follow up plan:   Myra Cody  Upstream Scheduler

## 2020-04-29 NOTE — Chronic Care Management (AMB) (Signed)
°  Chronic Care Management   Note  04/29/2020 Name: Taylor Burnett MRN: [id number] DOB: 01/03/1951  Taylor Burnett is a 68 y.o. year old female who is a primary care patient of Pickard, Warren T, MD. I reached out to [name] by phone today in response to a referral sent by [name]'s PCP, Pickard, Warren T, MD.   [name] was given information about Chronic Care Management services today including:  1. CCM service includes personalized support from designated clinical staff supervised by her physician, including individualized plan of care and coordination with other care providers 2. 24/7 contact phone numbers for assistance for urgent and routine care needs. 3. Service will only be billed when office clinical staff spend 20 minutes or more in a month to coordinate care. 4. Only one practitioner may furnish and bill the service in a calendar month. 5. The patient may stop CCM services at any time (effective at the end of the month) by phone call to the office staff.   Patient agreed to services and verbal consent obtained.   Follow up plan:   Myra Cody  Upstream Scheduler 

## 2020-05-27 ENCOUNTER — Other Ambulatory Visit: Payer: Self-pay | Admitting: Family Medicine

## 2020-05-27 DIAGNOSIS — J9611 Chronic respiratory failure with hypoxia: Secondary | ICD-10-CM

## 2020-05-27 DIAGNOSIS — R7303 Prediabetes: Secondary | ICD-10-CM

## 2020-05-27 DIAGNOSIS — J438 Other emphysema: Secondary | ICD-10-CM

## 2020-05-28 ENCOUNTER — Telehealth: Payer: Medicaid Other

## 2020-06-02 ENCOUNTER — Ambulatory Visit: Payer: Medicare Other | Admitting: Pharmacist

## 2020-06-02 ENCOUNTER — Other Ambulatory Visit: Payer: Self-pay

## 2020-06-02 DIAGNOSIS — K219 Gastro-esophageal reflux disease without esophagitis: Secondary | ICD-10-CM

## 2020-06-02 DIAGNOSIS — J438 Other emphysema: Secondary | ICD-10-CM

## 2020-06-02 NOTE — Chronic Care Management (AMB) (Addendum)
Chronic Care Management Pharmacy  Name: Taylor Burnett  MRN: [id number] DOB: [birth year]  Chief Complaint/ HPI  Taylor Burnett,  69 y.o. , female presents for their Initial CCM visit with the clinical pharmacist via telephone.  PCP : Donita Brooks, MD  Their chronic conditions include: COPD, GERD, Prediabetes, anxiety.  Office Visits:  03/29/2020 Tanya Nones) - presented with right side rib pain given Norco 5/325 and recommend x-rays if pain does not improve  Consult Visit:  04/22/2020 ([facility], pulmonology) - did not tolerate prednisone, she is rotating ABX the first week of each month  03/02/2020 & 03/09/2020 ([facility], pulmonology) - severe COPD with emphysema, started prednisone f/u in one month to ensure she is not having psychological effects (has had suicidal ideation prior with prednisone)  Medications: Outpatient Encounter Medications as of 06/02/2020  Medication Sig   albuterol (PROAIR HFA) 108 (90 Base) MCG/ACT inhaler INHALE 2 PUFFS INTO THE LUNGS EVERY 6 HOURS AS NEEDED.   BROVANA 15 MCG/2ML NEBU USE 1 VIAL  IN  NEBULIZER TWICE  DAILY - (Morning And Evening)   budesonide (PULMICORT) 0.25 MG/2ML nebulizer solution USE 1 VIAL  IN  NEBULIZER TWICE  DAILY - Rinse Mouth After Treatment   cefUROXime (CEFTIN) 250 MG tablet Take 1 tablet (250 mg total) by mouth 2 (two) times daily with a meal.   cetirizine (ZYRTEC) 10 MG tablet Take 10 mg by mouth at bedtime.    Cholecalciferol (VITAMIN D) 2000 units CAPS Take 2,000 Units by mouth daily.    doxycycline (VIBRA-TABS) 100 MG tablet Take 1 tablet (100 mg total) by mouth 2 (two) times daily. First 7 days of months: Jan, Apr, July, Oct,   fluticasone [facility]) 50 MCG/ACT nasal spray Place 2 sprays into both nostrils every evening.   gabapentin (NEURONTIN) 100 MG capsule TAKE 1 CAPSULE BY MOUTH 3 TIMES DAILY. (Patient taking differently: at bedtime. )   ipratropium (ATROVENT) 0.02 % nebulizer solution USE 1 VIAL IN NEBULIZER 3 TIMES  DAILY.  DX: J44.9   naproxen sodium (ALEVE) 220 MG tablet Take 440 mg by mouth 2 (two) times daily as needed (pain).    OXYGEN Inhale 2 L into the lungs as needed. FOR USE WITH EXERTION   denosumab (PROLIA) 60 MG/ML SOSY injection Inject 60 mg into the skin every 6 (six) months.   esomeprazole (NEXIUM) 40 MG capsule Take 1 capsule (40 mg total) by mouth daily at 12 noon. (Patient not taking: Reported on 06/02/2020)   HYDROcodone-acetaminophen (NORCO) 5-325 MG tablet Take 1 tablet by mouth every 6 (six) hours as needed for moderate pain (rib pain). (Patient not taking: Reported on 06/02/2020)   Melatonin 10 MG TABS Take 10 mg by mouth at bedtime as needed.    Facility-Administered Encounter Medications as of 06/02/2020  Medication   denosumab (PROLIA) injection 60 mg     Current Diagnosis/Assessment:    Physicist, medical Strain: Low Risk    Difficulty of Paying Living Expenses: Not very hard     Goals Addressed             This Visit's Progress    Pharmacy Care Plan:       CARE PLAN ENTRY  Current Barriers:  Chronic Disease Management support, education, and care coordination needs related to GERD and COPD  GERD Pharmacist Clinical Goal(s) Over the next 180 days, patient will work with PharmD and providers to optimize medication and symptoms related to GERD Current regimen:  No medications currently Interventions: Counseled on  appropriate treatment using OTC products Patient self care activities - Over the next 180 days, patient will: Contact PharmD or PCP with increased symptoms Treat intermittent symptoms with OTC options such as Pepcid and Tums  COPD Pharmacist Clinical Goal(s) Over the next 180 days, patient will work with PharmD and providers to optimize medication and minimize symptoms related to COPD. Current regimen:   Proair HFA 57mcg, budesonide 0.25mg /38ml nebulizers, ipratropium .02% nebulizer, Brovana 50mcg/2ml neb Interventions: Comprehensive medication  review Discussed frequency of rescue inhaler use Patient self care activities - Over the next 180 days, patient will: Continue to take medication as directed. Contact PharmD or PCP with any medication related concerns or increase in symptoms related to COPD.  Initial goal documentation         COPD    Gold Grade: Gold 4 (FEV1<40%) Current COPD Classification:  D (high sx, >/=2 exacerbations/yr)  Eosinophil count:   Lab Results  Component Value Date/Time   EOSPCT 4.0 12/11/2018 12:10 PM  %                               Eos (Absolute):  Lab Results  Component Value Date/Time   EOSABS 284 12/11/2018 12:10 PM    Tobacco Status:  Social History   Tobacco Use  Smoking Status Former Smoker   Packs/day: 2.00   Years: 40.00   Pack years: 80.00   Types: Cigarettes   Quit date: 07/31/2013   Years since quitting: 6.8  Smokeless Tobacco Never Used    Patient has failed these meds in past: Trelegy, Anoro Patient is currently controlled on the following medications: Proair HFA 38mcg, budesonide 0.25mg /44ml nebulizers, ipratropium .02% nebulizer, Brovana 30mcg/2ml neb Using maintenance inhaler regularly? Yes Frequency of rescue inhaler use:  daily  Patient wishes she could be on once daily inhaler, has tried multiply but they do not seem to last her all day and has resorted back to nebulizers.  Has tried almost all single dose inhalers.  No abnormal SOB noted recently.  Plan  Continue current medications.  Contact PharmD or PCP with increase in SOB/wheeze.  Prediabetes   Recent Relevant Labs: Lab Results  Component Value Date/Time   HGBA1C 5.5 04/20/2018 03:35 PM   HGBA1C 5.2 06/19/2017 02:08 PM   HGBA1C 5.4 01/23/2017 02:02 PM   HGBA1C 5.8 (H) 06/06/2016 11:50 AM     Checking BG: Never   Patient is currently controlled on the following medications: none  Last diabetic Foot exam: No results found for: HMDIABEYEEXA  Last diabetic Eye exam: No results found for:  HMDIABFOOTEX   Has not had A1c in 2 years, well controlled previously since 2017 A1c of 5.8.  Currently controlling with diet.  Plan  Continue control with diet. GERD   Patient has failed these meds in past: esomprazole 40mg  Patient is currently uncontrolled on the following medications: none  Patient d/c Nexium on her own d/t to lack of efficacy.  Still having some symptoms.  Recommended treat with OTC symptomatically.    Plan  Continue to treat intermittent symptoms with OTC options, remain off of Nexium.   Vaccines   Reviewed and discussed patient's vaccination history.    Immunization History  Administered Date(s) Administered   Pneumococcal Conjugate-13 07/30/2019   Pneumococcal Polysaccharide-23 04/20/2018   Tdap 06/06/2016    Plan  Recommended patient receive COVID-19 vaccine.  Medication Management   Miscellaneous medications: cetirizine 10mg ,  Flonase 31mcg nasal spray, gabapentin  100mg  OTC's: naproxen 220mg , melatonin 10mg  Patient currently uses .  Phone #  [phone] Patient reports using pill box and medication drawers to organize medications and promote adherence. Patient denies missed doses of medication.   , PharmD Clinical Pharmacist [facility] [phone] I have collaborated with the care management provider regarding care management and care coordination activities outlined in this encounter and have reviewed this encounter including documentation in the note and care plan. I am certifying that I agree with the content of this note and encounter as supervising physician.

## 2020-06-02 NOTE — Patient Instructions (Addendum)
Thank you for meeting with me today!  I look forward to working with you to help you meet all of your healthcare goals and answer any questions you may have.  Feel free to contact me anytime!   Visit Information  Goals Addressed            This Visit's Progress   . Pharmacy Care Plan:       CARE PLAN ENTRY  Current Barriers:  . Chronic Disease Management support, education, and care coordination needs related to GERD and COPD  GERD . Pharmacist Clinical Goal(s) o Over the next 180 days, patient will work with PharmD and providers to optimize medication and symptoms related to GERD . Current regimen:  o No medications currently . Interventions: o Counseled on appropriate treatment using OTC products . Patient self care activities - Over the next 180 days, patient will: o Contact PharmD or PCP with increased symptoms o Treat intermittent symptoms with OTC options such as Pepcid and Tums  COPD . Pharmacist Clinical Goal(s) o Over the next 180 days, patient will work with PharmD and providers to optimize medication and minimize symptoms related to COPD. . Current regimen:  o  Proair HFA , budesonide 0.25mg /74ml nebulizers, ipratropium .02% nebulizer, Brovana 90mcg/2ml neb . Interventions: o Comprehensive medication review o Discussed frequency of rescue inhaler use . Patient self care activities - Over the next 180 days, patient will: o Continue to take medication as directed. o Contact PharmD or PCP with any medication related concerns or increase in symptoms related to COPD.  Initial goal documentation        Ms. Donado was given information about Chronic Care Management services today including:  1. CCM service includes personalized support from designated clinical staff supervised by her physician, including individualized plan of care and coordination with other care providers 2. 24/7 contact phone numbers for assistance for urgent and routine care  needs. 3. Standard insurance, coinsurance, copays and deductibles apply for chronic care management only during months in which we provide at least 20 minutes of these services. Most insurances cover these services at 100%, however patients may be responsible for any copay, coinsurance and/or deductible if applicable. This service may help you avoid the need for more expensive face-to-face services. 4. Only one practitioner may furnish and bill the service in a calendar month. 5. The patient may stop CCM services at any time (effective at the end of the month) by phone call to the office staff.  Patient agreed to services and verbal consent obtained.   The patient verbalized understanding of instructions provided today and agreed to receive a mailed copy of patient instruction and/or educational materials. Telephone follow up appointment with pharmacy team member scheduled for: 12/04/2020 @ 12:30 PM  Willa Frater, PharmD Clinical Pharmacist [facility] [phone]   Food Choices for Gastroesophageal Reflux Disease, Adult When you have gastroesophageal reflux disease (GERD), the foods you eat and your eating habits are very important. Choosing the right foods can help ease your discomfort. Think about working with a nutrition specialist (dietitian) to help you make good choices. What are tips for following this plan?  Meals  Choose healthy foods that are low in fat, such as fruits, vegetables, whole grains, low-fat dairy products, and lean meat, fish, and poultry.  Eat small meals often instead of 3 large meals a day. Eat your meals slowly, and in a place where you are relaxed. Avoid bending over or lying down until 2-3  hours after eating.  Avoid eating meals 2-3 hours before bed.  Avoid drinking a lot of liquid with meals.  Cook foods using methods other than frying. Bake, grill, or broil food instead.  Avoid or limit: ? Chocolate. ? Peppermint or  spearmint. ? Alcohol. ? Pepper. ? Black and decaffeinated coffee. ? Black and decaffeinated tea. ? Bubbly (carbonated) soft drinks. ? Caffeinated energy drinks and soft drinks.  Limit high-fat foods such as: ? Fatty meat or fried foods. ? Whole milk, cream, butter, or ice cream. ? Nuts and nut butters. ? Pastries, donuts, and sweets made with butter or shortening.  Avoid foods that cause symptoms. These foods may be different for everyone. Common foods that cause symptoms include: ? Tomatoes. ? Oranges, lemons, and limes. ? Peppers. ? Spicy food. ? Onions and garlic. ? Vinegar. Lifestyle  Maintain a healthy weight. Ask your doctor what weight is healthy for you. If you need to lose weight, work with your doctor to do so safely.  Exercise for at least 30 minutes for 5 or more days each week, or as told by your doctor.  Wear loose-fitting clothes.  Do not smoke. If you need help quitting, ask your doctor.  Sleep with the head of your bed higher than your feet. Use a wedge under the mattress or blocks under the bed frame to raise the head of the bed. Summary  When you have gastroesophageal reflux disease (GERD), food and lifestyle choices are very important in easing your symptoms.  Eat small meals often instead of 3 large meals a day. Eat your meals slowly, and in a place where you are relaxed.  Limit high-fat foods such as fatty meat or fried foods.  Avoid bending over or lying down until 2-3 hours after eating.  Avoid peppermint and spearmint, caffeine, alcohol, and chocolate. This information is not intended to replace advice given to you by your health care provider. Make sure you discuss any questions you have with your health care provider. Document Revised: 04/04/2019 Document Reviewed: 01/17/2017 Elsevier Patient Education  [place].

## 2020-07-21 ENCOUNTER — Other Ambulatory Visit: Payer: Self-pay

## 2020-07-23 MED ORDER — CEFUROXIME AXETIL 250 MG PO TABS: 250.0000 mg | ORAL_TABLET | Freq: Two times a day (BID) | ORAL | 0 refills | Status: DC

## 2020-07-23 NOTE — Telephone Encounter (Signed)
ATC patient LMTCB need to confirm is she is still on all the antibiotics and what she needs sent into pharmacy.   Last OV note says:   We will start rotating antibiotics for the first week of every month as follows: -Doxycycline 100 mg twice a day for 7 days -Azithromycin, Z-Pak (5 days) -Cefuroxime 250 mg twice a day for 7 days

## 2020-07-23 NOTE — Telephone Encounter (Signed)
Dr. Delton Coombes please advise on patient mychart message  tried to have my Prescription filled for Cefuroxime 250 mg. My pharmacy said it was denied. Has Dr. Delton Coombes taken me off the antibiotic therapy? The reason I ask is this med should be started the 1st of August.   I haven't received a recall for an appointment so I'm assuming it's not that. Thank you for your help

## 2020-07-23 NOTE — Telephone Encounter (Signed)
I haven't changed it. Please call the pharmacy and find out why they are denying it

## 2020-07-23 NOTE — Telephone Encounter (Signed)
Pt called back. Pt says:  I use a different antibiotic each month, and one week of each month   Pt only needs refill of cefuroxime 250mg 

## 2020-09-23 ENCOUNTER — Encounter: Payer: Self-pay | Admitting: Emergency Medicine

## 2020-09-23 ENCOUNTER — Other Ambulatory Visit: Payer: Self-pay

## 2020-09-23 ENCOUNTER — Ambulatory Visit (INDEPENDENT_AMBULATORY_CARE_PROVIDER_SITE_OTHER): Payer: Medicare Other | Admitting: Emergency Medicine

## 2020-09-23 DIAGNOSIS — J9611 Chronic respiratory failure with hypoxia: Secondary | ICD-10-CM

## 2020-09-23 DIAGNOSIS — J438 Other emphysema: Secondary | ICD-10-CM | POA: Diagnosis not present

## 2020-09-23 DIAGNOSIS — A319 Mycobacterial infection, unspecified: Secondary | ICD-10-CM | POA: Diagnosis not present

## 2020-09-23 MED ORDER — BREZTRI AEROSPHERE 160-9-4.8 MCG/ACT IN AERO
2.0000 | INHALATION_SPRAY | Freq: Two times a day (BID) | RESPIRATORY_TRACT | 0 refills | Status: DC
Start: 2020-09-23 — End: 2020-10-26

## 2020-09-23 NOTE — Assessment & Plan Note (Signed)
Severe COPD, daily symptoms.  No intervention that we have made has really seem to make much impact.  She still has dyspnea, possibly increased cough and sputum production.  She is wondering about a simpler regimen besides doing nebulized therapy all day.  I think a trial of Markus Daft is reasonable.  She states that she would like to have something to use less frequently even if it is not her regular maintenance therapy, so she can use it on days when she has to be out of the house and is difficult to get all her nebs done.  I think she is approaching end-stage.  We talked today about pulmonary rehab.  She did in 2016 and does not want to go back.  She has depressive symptoms when she is on prednisone and does not tolerate.

## 2020-09-23 NOTE — Progress Notes (Signed)
Subjective:    Patient ID: Taylor Burnett, female    DOB: 07-Aug-1951, 69 y.o.   MRN: [id number]  Shortness of Breath   ROV 07/30/2019 --69 year old woman with severe COPD, hypoxemic respiratory failure, untreated obstructive sleep apnea, Mycobacterium gordonae colonization.  She has chronic cough due to this as well as GERD, allergic rhinitis.  We changed her bronchodilators to nebulized form, currently on Brovana, Pulmicort, scheduled Atrovent 3 times daily.  She has albuterol which she uses approximately.  Her oxygen is set on. She describes episodic dyspnea, "feels that she is suffocating", can happen a few times a week. Not always associated with exertion. Can get some relief from her O2 and her SABA. No associated pain, cough, wheeze. Scares her and assoc with some anxiety. She has a daily cough, never produces mucous. She wears her O2 when she goes out at 3L/min, rarely wears it at home. She notes some desaturations during the day.  She remains on fluticasone nasal spray, Nexium twice daily, Zyrtec.   ROV 03/02/20 --follow-up visit for 69 year old woman with severe COPD, untreated obstructive sleep apnea, associated hypoxemic respiratory failure.  She is also colonized with Mycobacterium gordonae and has been followed for associated pulmonary nodular disease.  Her most recent CT chest was 02/25/2019, reviewed by me, shows 10 mm right middle lobe groundglass nodule as well as some other stable scattered micronodularity. Current bronchodilator regimen Brovana/budesonide nebs, scheduled ipratropium nebs tid.  Remains on Zyrtec, Flonase nasal spray, Nexium once daily.  She uses albuterol approximately. Her breathing has not changed significantly. Significant SOB both at rest and with exertion. She has chest / pectoral cramping occasionally. She can walk about 50 feet. Her O2 is at 2-3 L/min, wearing reliably with exertion. She has daily cough, non productive. She has daily wheeze. She has tolerated  prednisone poorly in the past - depression. Unclear whether she would tolerate a low dose.   ROV 04/22/20 --follow-up visit for 69 year old woman with very severe COPD, untreated obstructive sleep apnea, chronic hypoxemic respiratory failure who is colonized with Mycobacterium gordonae and has associated pulmonary nodular disease.  At her last office visit based on her persistent dyspnea even at rest we started prednisone 10 mg daily.  She unfortunately experienced depressive side effects, so it was stopped. She has minimal cough, no sputum. occasional wheeze Bronchodilator regimen includes Brovana, budesonide, scheduled ipratropium.  Her oxygen is at 2-3 l/min at all times.   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  We have tried her on daily prednisone but this was stopped due to side effects.  Last visit we started her on cycled antibiotics which she is taking and tolerating.  Currently managed on Brovana, budesonide, ipratropium nebs tid.  She has albuterol which she uses 1-2x a day, but tells me that it doesn't help her.   Most recent CT chest was 02/25/2019 that showed no evidence of interstitial disease or groundglass.  There was diffuse bronchial wall thickening, emphysema, multiple small pulmonary nodules similar to 2015.  She wears her O2 2-3L/min with most activity and often at rest. She is having increased cough and mucous compared with last time. She does best in the am.  Overall she feels that her functional plan is worse even on the cycled antibiotics  Likely needs a repeat CT chest March 2022    Review of Systems  Respiratory: Positive for shortness of breath.  As per HPI     Objective:   Physical Exam Vitals:   09/23/20 1629  BP: 120/64  Pulse: 89  Temp: 98 F (36.7 C)  TempSrc: Temporal  SpO2: 96%  Weight: 145 lb  12.8 oz (66.1 kg)  Height: 5\' 2"  (1.575 m)   Gen: Pleasant, well-nourished, in no distress,  normal affect  ENT: No lesions,  mouth clear,  oropharynx clear, no postnasal drip  Neck: No JVD, no stridor  Lungs: No use of accessory muscles, distant, clear, no wheeze on normal breath.   Cardiovascular: RRR, heart sounds normal, no murmur or gallops, no peripheral edema  Musculoskeletal: No deformities, no cyanosis or clubbing  Neuro: alert, non focal  Skin: Warm, no lesions or rashes      Assessment & Plan:  COPD (chronic obstructive pulmonary disease) with emphysema (HCC) Severe COPD, daily symptoms.  No intervention that we have made has really seem to make much impact.  She still has dyspnea, possibly increased cough and sputum production.  She is wondering about a simpler regimen besides doing nebulized therapy all day.  I think a trial of is reasonable.  She states that she would like to have something to use less frequently even if it is not her regular maintenance therapy, so she can use it on days when she has to be out of the house and is difficult to get all her nebs done.  I think she is approaching end-stage.  We talked today about pulmonary rehab.  She did in 2016 and does not want to go back.  She has depressive symptoms when she is on prednisone and does not tolerate.  Chronic respiratory failure (HCC) Continue O2 2-3L/min  Mycobacterium gordonae infection Plan to repeat her CT scan of the chest in March 2022 to follow her micronodular disease.  There is been no progression so unclear whether she needs to be treated for the Mycobacterium.  It may be relevant since her COPD symptoms have been progressive, difficulty treat.  We may be able to make headway if we treat the Mycobacterium gordonae.  April 2022, MD, PhD 09/23/2020, 5:02 PM [facility] [phone] or if no answer [phone]

## 2020-09-23 NOTE — Patient Instructions (Signed)
Please continue your cycled antibiotics as you have been taking them We will not start prednisone at this time Temporarily stop your nebulizer medications and start registry 2 puffs twice a day.  Rinse and gargle after using.  Keep track of whether this medication helps you.  If so then we can order it through your pharmacy Keep your albuterol available to use either 2 puffs or 1 nebulizer treatment up to every 4 hours if needed for shortness of breath, chest tightness, wheezing. Wear your oxygen at 2 to 3 L/min as you have been doing We will likely repeat your CT scan of the chest in March 2022.  We can talk about the timing of the scan at your next office visit Follow with Dr Delton Coombes in 1 month or next available so that we can review your medicines and decide which regimen to continue.

## 2020-09-23 NOTE — Assessment & Plan Note (Signed)
Plan to repeat her CT scan of the chest in March 2022 to follow her micronodular disease.  There is been no progression so unclear whether she needs to be treated for the Mycobacterium.  It may be relevant since her COPD symptoms have been progressive, difficulty treat.  We may be able to make headway if we treat the Mycobacterium gordonae.

## 2020-09-23 NOTE — Assessment & Plan Note (Signed)
Continue O2 2-3L/min

## 2020-10-08 ENCOUNTER — Ambulatory Visit (INDEPENDENT_AMBULATORY_CARE_PROVIDER_SITE_OTHER): Payer: Medicare Other | Admitting: Family Medicine

## 2020-10-08 ENCOUNTER — Other Ambulatory Visit: Payer: Self-pay

## 2020-10-08 VITALS — BP 120/70 | HR 93 | Temp 97.8°F | Ht 62.0 in | Wt 146.0 lb

## 2020-10-08 DIAGNOSIS — R55 Syncope and collapse: Secondary | ICD-10-CM

## 2020-10-08 DIAGNOSIS — R079 Chest pain, unspecified: Secondary | ICD-10-CM

## 2020-10-08 DIAGNOSIS — I499 Cardiac arrhythmia, unspecified: Secondary | ICD-10-CM | POA: Diagnosis not present

## 2020-10-08 NOTE — Progress Notes (Signed)
Subjective:    Patient ID: Taylor Burnett, female    DOB: 20-Jan-1951, 69 y.o.   MRN: [id number]  HPI Patient is a 69 year-old white female with a history of COPD which is oxygen dependent.  She states that over the last several months she has had occasional chest pain.  For instance last week she had an episode of chest pain that began first thing in the morning.  It was substernal in location.  It did not radiate.  It lasted all day long.  It gradually went away on its own.  She has had several days similar to this.  This began a few months ago but has become increasingly frequent over the last few weeks.  She also reports occasional episodes where she feels lightheaded like she may pass out.  At that time she denies any tachycardia or irregular heartbeat although she does state that occasionally she will feel her heart race but that is usually with activity.  She denies any pleurisy or hemoptysis.  She denies any shortness of breath beyond baseline.  Patient had a negative stress test in 2017. Past Medical History:  Diagnosis Date  . Arthritis   . Cataract   . COPD (chronic obstructive pulmonary disease) (HCC)   . Cough   . DDD (degenerative disc disease), cervical   . Dysrhythmia    RAPID HEART BEAT OCC   . Emphysema of lung (HCC)   . GERD (gastroesophageal reflux disease)    OCC  . H/O degenerative disc disease   . Headache   . Myocardial infarction (HCC) 2016  . Osteoporosis   . Panic attacks   . Pre-diabetes   . Severe lung disease   . Shortness of breath dyspnea   . Sleep apnea   . Spondylosis    Past Surgical History:  Procedure Laterality Date  . ANTERIOR CERVICAL DECOMP/DISCECTOMY FUSION N/A 03/13/2015   Procedure: CERVICAL THREE-CERVICAL FOUR, CERVICAL FOUR-CERVICAL FIVE, CERVICAL FIVE-CERVICAL SIX ANTERIOR CERVICAL DECOMPRESSION/DISCECTOMY FUSION 3 LEVELS;  Surgeon: Tia Alert, MD;  Location: [facility];  Service: Neurosurgery;  Laterality: N/A;  . BREAST  ENHANCEMENT SURGERY  1990  . CARDIAC CATHETERIZATION  1995  . CATARACT EXTRACTION W/PHACO Right 05/02/2017   Procedure: CATARACT EXTRACTION PHACO AND INTRAOCULAR LENS PLACEMENT (IOC);  Surgeon: Jethro Bolus, MD;  Location: [facility];  Service: Ophthalmology;  Laterality: Right;  CDE: 6.22  . CATARACT EXTRACTION W/PHACO Left 05/16/2017   Procedure: CATARACT EXTRACTION PHACO AND INTRAOCULAR LENS PLACEMENT (IOC);  Surgeon: Jethro Bolus, MD;  Location: [facility];  Service: Ophthalmology;  Laterality: Left;  CDE: 4.10  . TONSILLECTOMY    . TUBAL LIGATION    . VIDEO BRONCHOSCOPY Bilateral 12/31/2018   Procedure: VIDEO BRONCHOSCOPY WITHOUT FLUORO;  Surgeon: Leslye Peer, MD;  Location: [facility];  Service: Cardiopulmonary;  Laterality: Bilateral;   Current Outpatient Medications on File Prior to Visit  Medication Sig Dispense Refill  . albuterol (PROAIR HFA) 108 (90 Base) MCG/ACT inhaler INHALE 2 PUFFS INTO THE LUNGS EVERY 6 HOURS AS NEEDED. 18 g 5  . BROVANA 15 MCG/2ML NEBU USE 1 VIAL  IN  NEBULIZER TWICE  DAILY - (Morning And Evening) 120 mL 11  . Budeson-Glycopyrrol-Formoterol (BREZTRI AEROSPHERE) 160-9-4.8 MCG/ACT AERO Inhale 2 puffs into the lungs in the morning and at bedtime. 5.9 g 0  . budesonide (PULMICORT) 0.25 MG/2ML nebulizer solution USE 1 VIAL  IN  NEBULIZER TWICE  DAILY - Rinse Mouth After Treatment 120 mL 11  .  cefUROXime (CEFTIN) 250 MG tablet Take 1 tablet (250 mg total) by mouth 2 (two) times daily with a meal. 14 tablet 0  . cetirizine (ZYRTEC) 10 MG tablet Take 10 mg by mouth at bedtime.     . Cholecalciferol (VITAMIN D) 2000 units CAPS Take 2,000 Units by mouth daily.     Marland Kitchen denosumab (PROLIA) 60 MG/ML SOSY injection Inject 60 mg into the skin every 6 (six) months.    . esomeprazole (NEXIUM) 40 MG capsule Take 1 capsule (40 mg total) by mouth daily at 12 noon. 31 capsule 3  . fluticasone (FLONASE) 50 MCG/ACT nasal spray Place 2 sprays into both nostrils every evening. 16 mL 12  .  gabapentin (NEURONTIN) 100 MG capsule TAKE 1 CAPSULE BY MOUTH 3 TIMES DAILY. (Patient taking differently: at bedtime. ) 90 capsule 3  . HYDROcodone-acetaminophen (NORCO) 5-325 MG tablet Take 1 tablet by mouth every 6 (six) hours as needed for moderate pain (rib pain). 30 tablet 0  . ipratropium (ATROVENT) 0.02 % nebulizer solution USE 1 VIAL IN NEBULIZER 3 TIMES DAILY.  DX: J44.9 90 mL 11  . Melatonin 10 MG TABS Take 10 mg by mouth at bedtime as needed.     . naproxen sodium (ALEVE) 220 MG tablet Take 440 mg by mouth 2 (two) times daily as needed (pain).     . OXYGEN Inhale 2 L into the lungs as needed. FOR USE WITH EXERTION     Current Facility-Administered Medications on File Prior to Visit  Medication Dose Route Frequency Provider Last Rate Last Admin  . denosumab (PROLIA) injection 60 mg  60 mg Subcutaneous Q6 months Donita Brooks, MD   60 mg at 02/13/19 1553   Allergies  Allergen Reactions  . Prednisone Other (See Comments)    Depression and suicidal   Social History   Socioeconomic History  . Marital status: Widowed    Spouse name: Not on file  . Number of children: Not on file  . Years of education: Not on file  . Highest education level: Not on file  Occupational History  . Occupation: on disability.  Tobacco Use  . Smoking status: Former Smoker    Packs/day: 2.00    Years: 40.00    Pack years: 80.00    Types: Cigarettes    Quit date: 07/31/2013    Years since quitting: 7.1  . Smokeless tobacco: Never Used  Vaping Use  . Vaping Use: Never used  Substance and Sexual Activity  . Alcohol use: No    Comment: OCC WINE   . Drug use: No  . Sexual activity: Not on file  Other Topics Concern  . Not on file  Social History Narrative   Divorced and lives alone.    Social Determinants of Health   Financial Resource Strain: Low Risk   . Difficulty of Paying Living Expenses: Not very hard  Food Insecurity:   . Worried About Programme researcher, broadcasting/film/video in the Last Year: Not on  file  . Ran Out of Food in the Last Year: Not on file  Transportation Needs:   . Lack of Transportation (Medical): Not on file  . Lack of Transportation (Non-Medical): Not on file  Physical Activity:   . Days of Exercise per Week: Not on file  . Minutes of Exercise per Session: Not on file  Stress:   . Feeling of Stress : Not on file  Social Connections:   . Frequency of Communication with Friends and Family: Not  on file  . Frequency of Social Gatherings with Friends and Family: Not on file  . Attends Religious Services: Not on file  . Active Member of Clubs or Organizations: Not on file  . Attends Banker Meetings: Not on file  . Marital Status: Not on file  Intimate Partner Violence:   . Fear of Current or Ex-Partner: Not on file  . Emotionally Abused: Not on file  . Physically Abused: Not on file  . Sexually Abused: Not on file      Review of Systems  All other systems reviewed and are negative.      Objective:   Physical Exam Vitals reviewed.  Constitutional:      General: She is not in acute distress.    Appearance: She is well-developed. She is not diaphoretic.  HENT:     Head: Normocephalic and atraumatic.     Nose: Nose normal.  Eyes:     Conjunctiva/sclera: Conjunctivae normal.     Pupils: Pupils are equal, round, and reactive to light.  Neck:     Vascular: No JVD.  Cardiovascular:     Rate and Rhythm: Normal rate. Rhythm irregular.     Heart sounds: Normal heart sounds. No murmur heard.  No friction rub. No gallop.   Pulmonary:     Effort: Pulmonary effort is normal.     Breath sounds: Examination of the right-lower field reveals rales. Examination of the left-lower field reveals rales. Rales present. No wheezing or rhonchi.  Abdominal:     Palpations: Abdomen is soft.  Musculoskeletal:     Cervical back: Neck supple.    Patient has chronic bibasilar crackles.  This was present on every office visit dating back to 2019 when she  establish care.  This is not a new finding.  However today on exam, her heart rate is irregular.       Assessment & Plan:  Chest pain, unspecified type - Plan: EKG 12-Lead  Near syncope  Irregular heart rate EKG today shows marked sinus arrhythmia.  However there are P waves before every QRS complex and therefore the irregular rhythm is not atrial fibrillation.  That being said, the patient is having chest pain with shortness of breath and presyncope.  I feel that she would benefit from a cardiology consultation.  At the present time she is pain-free.  Therefore I will consult cardiology as an outpatient.  If she develops any chest pain at all I have recommended that she go immediately to the emergency room.  The patient is comfortable with this plan.  Patient would also likely benefit from an event monitor to determine the cause of her near syncope or at least rule out cardiac arrhythmia.

## 2020-10-09 LAB — CBC WITH DIFFERENTIAL/PLATELET
Absolute Monocytes: 443 cells/uL (ref 200–950)
Basophils Absolute: 30 cells/uL (ref 0–200)
Basophils Relative: 0.4 %
Eosinophils Absolute: 180 cells/uL (ref 15–500)
Eosinophils Relative: 2.4 %
HCT: 43.3 % (ref 35.0–45.0)
Hemoglobin: 14.5 g/dL (ref 11.7–15.5)
Lymphs Abs: 1515 cells/uL (ref 850–3900)
MCH: 31 pg (ref 27.0–33.0)
MCHC: 33.5 g/dL (ref 32.0–36.0)
MCV: 92.7 fL (ref 80.0–100.0)
MPV: 9.1 fL (ref 7.5–12.5)
Monocytes Relative: 5.9 %
Neutro Abs: 5333 cells/uL (ref 1500–7800)
Neutrophils Relative %: 71.1 %
Platelets: 283 10*3/uL (ref 140–400)
RBC: 4.67 10*6/uL (ref 3.80–5.10)
RDW: 12.4 % (ref 11.0–15.0)
Total Lymphocyte: 20.2 %
WBC: 7.5 10*3/uL (ref 3.8–10.8)

## 2020-10-09 LAB — COMPLETE METABOLIC PANEL WITH GFR
AG Ratio: 1.8 (calc) (ref 1.0–2.5)
ALT: 12 U/L (ref 6–29)
AST: 16 U/L (ref 10–35)
Albumin: 4.3 g/dL (ref 3.6–5.1)
Alkaline phosphatase (APISO): 62 U/L (ref 37–153)
BUN: 16 mg/dL (ref 7–25)
CO2: 28 mmol/L (ref 20–32)
Calcium: 9.5 mg/dL (ref 8.6–10.4)
Chloride: 101 mmol/L (ref 98–110)
Creat: 0.87 mg/dL (ref 0.50–0.99)
GFR, Est African American: 79 mL/min/{1.73_m2} (ref >=60)
GFR, Est Non African American: 68 mL/min/{1.73_m2} (ref >=60)
Globulin: 2.4 g/dL (calc) (ref 1.9–3.7)
Glucose, Bld: 85 mg/dL (ref 65–99)
Potassium: 4.1 mmol/L (ref 3.5–5.3)
Sodium: 141 mmol/L (ref 135–146)
Total Bilirubin: 0.4 mg/dL (ref 0.2–1.2)
Total Protein: 6.7 g/dL (ref 6.1–8.1)

## 2020-10-09 LAB — BRAIN NATRIURETIC PEPTIDE: Brain Natriuretic Peptide: 25 pg/mL (ref <100)

## 2020-10-26 ENCOUNTER — Ambulatory Visit (INDEPENDENT_AMBULATORY_CARE_PROVIDER_SITE_OTHER): Payer: Medicare Other | Admitting: Emergency Medicine

## 2020-10-26 ENCOUNTER — Encounter: Payer: Self-pay | Admitting: Emergency Medicine

## 2020-10-26 ENCOUNTER — Other Ambulatory Visit: Payer: Self-pay

## 2020-10-26 DIAGNOSIS — J438 Other emphysema: Secondary | ICD-10-CM

## 2020-10-26 DIAGNOSIS — A319 Mycobacterial infection, unspecified: Secondary | ICD-10-CM

## 2020-10-26 MED ORDER — BREZTRI AEROSPHERE 160-9-4.8 MCG/ACT IN AERO
2.0000 | INHALATION_SPRAY | Freq: Two times a day (BID) | RESPIRATORY_TRACT | 5 refills | Status: DC
Start: 2020-10-26 — End: 2021-04-15

## 2020-10-26 MED ORDER — CEFUROXIME AXETIL 250 MG PO TABS: 250.0000 mg | ORAL_TABLET | Freq: Two times a day (BID) | ORAL | 2 refills | Status: DC

## 2020-10-26 NOTE — Assessment & Plan Note (Signed)
Zyrtec, fluticasone nasal spray

## 2020-10-26 NOTE — Assessment & Plan Note (Signed)
With micronodular disease.  Planning for a repeat CT chest in March 2022.  Unclear whether this is contributing significantly to her daily symptoms.  She does not produce sputum.

## 2020-10-26 NOTE — Patient Instructions (Addendum)
Restart your Brovana and ipratropium nebs every morning Take your first dose of Breztri 2 puffs in the middle of the day, then your second dose in the evening.  Rinse and gargle after using. Keep albuterol available use 1 nebulizer treatment if needed for shortness of breath, chest tightness, wheezing. Continue rotating your antibiotics as you have been doing Continue your oxygen at 2 to 3 L/min We will plan to repeat your CT scan of the chest in March 2022. Follow with Dr Delton Coombes in 1 month

## 2020-10-26 NOTE — Assessment & Plan Note (Signed)
She may lost ground with the change from her nebulizer routine to breztri.  I think it would be reasonable to try to restart her morning Brovana, ipratropium and then try to get by with breztri later in the day.  She does not tolerate prednisone.  She is on cycled antibiotics and is about to start Ceftin.

## 2020-10-26 NOTE — Progress Notes (Signed)
Subjective:    Patient ID: Taylor Burnett, female    DOB: 1951-09-28, 69 y.o.   MRN: [id number]  Shortness of Breath   ROV 04/22/20 --follow-up visit for 69 year old woman with very severe COPD, untreated obstructive sleep apnea, chronic hypoxemic respiratory failure who is colonized with Mycobacterium gordonae and has associated pulmonary nodular disease.  At her last office visit based on her persistent dyspnea even at rest we started prednisone 10 mg daily.  She unfortunately experienced depressive side effects, so it was stopped. She has minimal cough, no sputum. occasional wheeze Bronchodilator regimen includes Brovana, budesonide, scheduled ipratropium.  Her oxygen is at 2-3 l/min at all times.  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  We have tried her on daily prednisone but this was stopped due to side effects.  Last visit we started her on cycled antibiotics which she is taking and tolerating.  Currently managed on Brovana, budesonide, ipratropium nebs tid.  She has albuterol which she uses 1-2x a day, but tells me that it doesn't help her.   Most recent CT chest was 02/25/2019 that showed no evidence of interstitial disease or groundglass.  There was diffuse bronchial wall thickening, emphysema, multiple small pulmonary nodules similar to 2015.  She wears her O2 2-3L/min with most activity and often at rest. She is having increased cough and mucous compared with last time. She does best in the am.  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  She is on cycled antibiotics, has also been managed on Brovana, budesonide and ipratropium nebs.  We tried starting Breztri at her last visit to see if she would get more benefit, if this would offer similar control. She noticed that the [facility] didn't keep her as well controlled as the neb regimen - by 4pm most days she was more SOB. She is rotating azithro / ceftin / doxy monthly. She is on zyrtec, flonase.    Review of Systems  Respiratory: Positive for shortness of breath.   As per HPI     Objective:   Physical Exam Vitals:   10/26/20 1103  BP: 120/72  Pulse: 92  Temp: (!) 97.3 F (36.3 C)  TempSrc: Temporal  SpO2: 94%  Weight: 147 lb (66.7 kg)  Height: 5\' 1"  (1.549 m)   Gen: Pleasant, well-nourished, in no distress,  normal affect  ENT: No lesions,  mouth clear,  oropharynx clear, no postnasal drip  Neck: No JVD, no stridor  Lungs: No use of accessory muscles, very distant, clear, no wheeze on normal breath.   Cardiovascular: RRR, heart sounds normal, no murmur or gallops, no peripheral edema  Musculoskeletal: No deformities, no cyanosis or clubbing  Neuro: alert, non focal  Skin: Warm, no lesions or rashes      Assessment & Plan:  COPD (chronic obstructive pulmonary disease) with emphysema (HCC) She may lost ground with the change from her nebulizer routine to breztri.  I think it would be reasonable to try to restart her morning Brovana, ipratropium and then try to get by with breztri later in the day.  She does not tolerate prednisone.  She is on cycled antibiotics and is about to start Ceftin.  Chronic respiratory failure (HCC) Continue oxygen 2 to 3 L/min depending on her degree of exertion.  Chronic rhinitis Zyrtec, fluticasone nasal spray  Mycobacterium gordonae infection With micronodular disease.  Planning for a repeat CT chest in March 2022.  Unclear whether this is contributing significantly to her daily symptoms.   She does not produce sputum.  Levy Pupa, MD, PhD 10/26/2020, 11:28 AM [facility] [phone] or if no answer [phone]

## 2020-10-26 NOTE — Assessment & Plan Note (Signed)
Continue oxygen 2 to 3 L/min depending on her degree of exertion.

## 2020-10-27 ENCOUNTER — Telehealth: Payer: Self-pay | Admitting: Pharmacist

## 2020-10-27 NOTE — Progress Notes (Signed)
Chronic Care Management Pharmacy Assistant   Name: Taylor Burnett  MRN: [id number] DOB: Sep 26, 1951  Reason for Encounter: Disease State  Patient Questions:  1.  Have you seen any other providers since your last visit? Yes  2.  Any changes in your medicines or health? Yes    PCP : Donita Brooks, MD   Their chronic conditions include: COPD, GERD, Prediabetes, anxiety.  Office Visits: None since their last CCM visit with the clinical pharmacist.  Consults: 10-26-2020 (Pulmonologist)  The notes indicate Breztri didn't keep her as well controlled as the neb regimen - by 4pm most days she was more SOB. She is rotating azithro / ceftin / doxy monthly. She is on zyrtec, Flonase. Patient was advised to restart your Brovana and ipratropium nebs every morning Take your first dose of Breztri 2 puffs in the middle of the day, then your second dose in the evening.  09-23-2020 (Pulmonologist) Patient presented in the office with Dr. Delton Coombes with a hx of severe COPD. The physician proposed a trial of Breztri to help patient reduce the frequency of treatment. There is plans to repeat her CT scan of the chest in March 2022. There was discussion about pulmonary rehab, but patient does want to go back. Was advised to continue her cycled antibitocs. Temporarily stop your nebulizer medications and start registry 2 puffs twice a day.    Allergies:   Allergies  Allergen Reactions   Prednisone Other (See Comments)    Depression and suicidal    Medications: Outpatient Encounter Medications as of 10/27/2020  Medication Sig   albuterol (PROAIR HFA) 108 (90 Base) MCG/ACT inhaler INHALE 2 PUFFS INTO THE LUNGS EVERY 6 HOURS AS NEEDED.   BROVANA 15 MCG/2ML NEBU USE 1 VIAL  IN  NEBULIZER TWICE  DAILY - (Morning And Evening)   Budeson-Glycopyrrol-Formoterol (BREZTRI AEROSPHERE) 160-9-4.8 MCG/ACT AERO Inhale 2 puffs into the lungs in the morning and at bedtime.   budesonide (PULMICORT) 0.25 MG/2ML  nebulizer solution USE 1 VIAL  IN  NEBULIZER TWICE  DAILY - Rinse Mouth After Treatment   cefUROXime (CEFTIN) 250 MG tablet Take 1 tablet (250 mg total) by mouth 2 (two) times daily with a meal.   cetirizine (ZYRTEC) 10 MG tablet Take 10 mg by mouth at bedtime.    Cholecalciferol (VITAMIN D) 2000 units CAPS Take 2,000 Units by mouth daily.    denosumab (PROLIA) 60 MG/ML SOSY injection Inject 60 mg into the skin every 6 (six) months.   esomeprazole (NEXIUM) 40 MG capsule Take 1 capsule (40 mg total) by mouth daily at 12 noon.   fluticasone (FLONASE) 50 MCG/ACT nasal spray Place 2 sprays into both nostrils every evening.   gabapentin (NEURONTIN) 100 MG capsule TAKE 1 CAPSULE BY MOUTH 3 TIMES DAILY. (Patient taking differently: at bedtime. )   HYDROcodone-acetaminophen (NORCO) 5-325 MG tablet Take 1 tablet by mouth every 6 (six) hours as needed for moderate pain (rib pain).   ipratropium (ATROVENT) 0.02 % nebulizer solution USE 1 VIAL IN NEBULIZER 3 TIMES DAILY.  DX: J44.9   Melatonin 10 MG TABS Take 10 mg by mouth at bedtime as needed.    naproxen sodium (ALEVE) 220 MG tablet Take 440 mg by mouth 2 (two) times daily as needed (pain).    OXYGEN Inhale 2 L into the lungs as needed. 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   Chronic cough 10/22/2018   GERD (gastroesophageal reflux disease) 07/20/2018   Anxiety 06/21/2017   Prediabetes 06/21/2017   Chronic respiratory failure (HCC) 04/18/2017   Other insomnia 01/23/2017   COPD (chronic obstructive pulmonary disease) with emphysema (HCC) 01/16/2017   Sleep related hypoxia 01/16/2017   Sleep apnea in adult 07/11/2016   Chronic rhinitis 01/05/2016   S/P cervical spinal fusion 03/13/2015   Hemoptysis  01/01/2014   Atypical chest pain 06/08/2012   Neutropenia 12/23/2011   Thrombocytopenia (HCC) 12/22/2011   Hypoxemia 12/19/2011    Goals Addressed   None     Current COPD regimen:  o Proair HFA o budesonide 0.25mg /46ml nebulizers o  ipratropium .02% nebulizer o  Brovana 39mcg/2ml neb o Breztri trail started 09-23-2020   No flowsheet data found.  Any recent hospitalizations or ED visits since last visit with CPP? No    Reports COPD symptoms, including Rescue medicine is not helping, Shortness of breath at rest, Symptoms worse at night and Wheezing    What recent interventions/DTPs have been made by any provider to improve breathing since last visit: Started a trial of Breztri in addition to her regimen   Have you had exacerbation/flare-up since last visit? Yes    What do you do when you are short of breath?  Adhere to COPD Action Plan patient on continual O2. She also has anxiety. States she tries to calm herself down and focus on her breathing as part of her COPD action plan.  Respiratory Devices/Equipment  Do you have a nebulizer? Yes  Do you use a Peak Flow Meter? No  Do you use a maintenance inhaler? No Patient only uses nebulizer for maintenance  How often do you forget to use your daily inhaler? No  Do you use a rescue inhaler? Yes patient reports does not help  How often do you use your rescue inhaler?  daily  Do you use a spacer with your inhaler? No  Adherence Review:  Does the patient have >5 day gap between last estimated fill date for maintenance inhaler medications? No CPP please confirm   Follow-Up:  Pharmacist Review   Corwin Levins, [facility] Clinical Pharmacist Assistant [phone]

## 2020-11-18 ENCOUNTER — Telehealth: Payer: Self-pay | Admitting: Radiology

## 2020-11-18 ENCOUNTER — Other Ambulatory Visit: Payer: Self-pay

## 2020-11-18 ENCOUNTER — Ambulatory Visit (INDEPENDENT_AMBULATORY_CARE_PROVIDER_SITE_OTHER): Payer: Medicare Other | Admitting: Cardiology

## 2020-11-18 ENCOUNTER — Encounter: Payer: Self-pay | Admitting: Cardiology

## 2020-11-18 VITALS — BP 126/74 | HR 85 | Ht 61.0 in | Wt 148.0 lb

## 2020-11-18 DIAGNOSIS — R072 Precordial pain: Secondary | ICD-10-CM

## 2020-11-18 DIAGNOSIS — R079 Chest pain, unspecified: Secondary | ICD-10-CM | POA: Diagnosis not present

## 2020-11-18 DIAGNOSIS — R931 Abnormal findings on diagnostic imaging of heart and coronary circulation: Secondary | ICD-10-CM

## 2020-11-18 DIAGNOSIS — R002 Palpitations: Secondary | ICD-10-CM

## 2020-11-18 MED ORDER — METOPROLOL TARTRATE 100 MG PO TABS: ORAL_TABLET | ORAL | 0 refills | Status: DC

## 2020-11-18 NOTE — Patient Instructions (Addendum)
Medication Instructions:  Your physician recommends that you continue on your current medications as directed. Please refer to the Current Medication list given to you today.  *If you need a refill on your cardiac medications before your next appointment, please call your pharmacy*   Testing/Procedures: Your physician has requested that you have an echocardiogram. Echocardiography is a painless test that uses sound waves to create images of your heart. It provides your doctor with information about the size and shape of your heart and how well your heart's chambers and valves are working. This procedure takes approximately one hour. There are no restrictions for this procedure.  Your physician has recommended that you wear an event monitor. Event monitors are medical devices that record the heart's electrical activity. Doctors most often [place] these monitors to diagnose arrhythmias. Arrhythmias are problems with the speed or rhythm of the heartbeat. The monitor is a small, portable device. You can wear one while you do your normal daily activities. This is usually used to diagnose what is causing palpitations/syncope (passing out).  Your physician has recommended that you have a coronary CTA scan. Please see next page for additional instructions.   Follow-Up: At [facility], you and your health needs are our priority.  As part of our continuing mission to provide you with exceptional heart care, we have created designated Provider Care Teams.  These Care Teams include your primary Cardiologist (physician) and Advanced Practice Providers (APPs -  Physician Assistants and Nurse Practitioners) who all work together to provide you with the care you need, when you need it.  Follow up with Dr. Radford Pax as needed based on results of testing.    Other Instructions Your cardiac CT will be scheduled at:   [facility] [address] [place], [place] [address] [phone]  Please arrive  at the [facility] main entrance of [facility] 30 minutes prior to test start time. Proceed to the [facility] Radiology Department (first floor) to check-in and test prep.  Please follow these instructions carefully (unless otherwise directed):  On the Night Before the Test: . Be sure to Drink plenty of water. . Do not consume any caffeinated/decaffeinated beverages or chocolate 12 hours prior to your test. . Do not take any antihistamines 12 hours prior to your test.  On the Day of the Test: . Drink plenty of water. Do not drink any water within one hour of the test. . Do not eat any food 4 hours prior to the test. . You may take your regular medications prior to the test.  . Take metoprolol (Lopressor) two hours prior to test. . FEMALES- please wear underwire-free bra if available  After the Test: . Drink plenty of water. . After receiving IV contrast, you may experience a mild flushed feeling. This is normal. . On occasion, you may experience a mild rash up to 24 hours after the test. This is not dangerous. If this occurs, you can take Benadryl 25 mg and increase your fluid intake. . If you experience trouble breathing, this can be serious. If it is severe call 911 IMMEDIATELY. If it is mild, please call our office. . If you take any of these medications: Glipizide/Metformin, Avandament, Glucavance, please do not take 48 hours after completing test unless otherwise instructed.   Once we have confirmed authorization from your insurance company, we will call you to set up a date and time for your test. Based on how quickly your insurance processes prior authorizations  requests, please allow up to 4 weeks to be contacted for scheduling your Cardiac CT appointment. Be advised that routine Cardiac CT appointments could be scheduled as many as 8 weeks after your provider has ordered it.  For non-scheduling related questions, please contact the cardiac imaging nurse navigator should  you have any questions/concerns: Marchia Bond, Cardiac Imaging Nurse Navigator Burley Saver, Interim Cardiac Imaging Nurse [facility] Direct Office Dial: [phone]   For scheduling needs, including cancellations and rescheduling, please call [place], [phone] (temporary number).

## 2020-11-18 NOTE — Telephone Encounter (Signed)
Enrolled patient for a 30 day [employer] Event Monitor to be mailed to patients home  

## 2020-11-18 NOTE — Progress Notes (Signed)
Cardiology Consult Note    Date:  11/18/2020   ID:  JOZALYN BAGLIO, DOB 04/19/1951, MRN [id number]  PCP:  Donita Brooks, MD  Cardiologist:  Armanda Magic, MD   Chief Complaint  Patient presents with  . New Patient (Initial Visit)    Chest pain, SOB, palpitations, presyncope    History of Present Illness:  Taylor Burnett is a 69 y.o. female who is being seen today for the evaluation of chest pain, near syncope and palpitations at the request of Donita Brooks, MD.  This is a 69yo female with a hx of COPD, preDM and GERD who is referred by her PCP for evaluation of chest pain and palpitations.  She recently went to her PCP complaining that she was having chest pain and occasionally feeling like she may pass out.  She tells me that the CP has been off and on for the last few months.  It is nonexertional and comes on suddenly and intense in the mid sternal area and occasionally will radiate into her right side as well as in her jaw.  The pain is associated with nausea but no diaphoresis.  She has chronic DOE related to severe COPD and says that she is always very SOB.  When she gets the CP she takes ASA and helps to relieve it.    She also has noticed fluttering in her chest at random times and does not occur frequently and usually lasts about 5-10 minutes.  She also has spells where suddenly she feels like she is going to pass out.  She has never had syncope.  This can occur even at rest when just sitting.  She denies any LE edema.  She is on O2 all night and occasionally during the day.  She is a former smoker.  Her maternal GM died of an MI in her 63's.   Past Medical History:  Diagnosis Date  . Arthritis   . Cataract   . COPD (chronic obstructive pulmonary disease) (HCC)   . Cough   . DDD (degenerative disc disease), cervical   . Dysrhythmia    RAPID HEART BEAT OCC   . Emphysema of lung (HCC)   . GERD (gastroesophageal reflux disease)    OCC  . H/O degenerative disc  disease   . Headache   . Myocardial infarction (HCC) 2016  . Osteoporosis   . Panic attacks   . Pre-diabetes   . Severe lung disease   . Shortness of breath dyspnea   . Sleep apnea   . Spondylosis     Past Surgical History:  Procedure Laterality Date  . ANTERIOR CERVICAL DECOMP/DISCECTOMY FUSION N/A 03/13/2015   Procedure: CERVICAL THREE-CERVICAL FOUR, CERVICAL FOUR-CERVICAL FIVE, CERVICAL FIVE-CERVICAL SIX ANTERIOR CERVICAL DECOMPRESSION/DISCECTOMY FUSION 3 LEVELS;  Surgeon: Tia Alert, MD;  Location: [facility];  Service: Neurosurgery;  Laterality: N/A;  . BREAST ENHANCEMENT SURGERY  1990  . CARDIAC CATHETERIZATION  1995  . CATARACT EXTRACTION W/PHACO Right 05/02/2017   Procedure: CATARACT EXTRACTION PHACO AND INTRAOCULAR LENS PLACEMENT (IOC);  Surgeon: Jethro Bolus, MD;  Location: [facility];  Service: Ophthalmology;  Laterality: Right;  CDE: 6.22  . CATARACT EXTRACTION W/PHACO Left 05/16/2017   Procedure: CATARACT EXTRACTION PHACO AND INTRAOCULAR LENS PLACEMENT (IOC);  Surgeon: Jethro Bolus, MD;  Location: [facility];  Service: Ophthalmology;  Laterality: Left;  CDE: 4.10  . TONSILLECTOMY    . TUBAL LIGATION    . VIDEO BRONCHOSCOPY Bilateral 12/31/2018   Procedure: VIDEO  BRONCHOSCOPY WITHOUT FLUORO;  Surgeon: Leslye Peer, MD;  Location: [facility];  Service: Cardiopulmonary;  Laterality: Bilateral;    Current Medications: Current Meds  Medication Sig  . albuterol (PROAIR HFA) 108 (90 Base) MCG/ACT inhaler INHALE 2 PUFFS INTO THE LUNGS EVERY 6 HOURS AS NEEDED.  Marland Kitchen BROVANA 15 MCG/2ML NEBU USE 1 VIAL  IN  NEBULIZER TWICE  DAILY - (Morning And Evening)  . Budeson-Glycopyrrol-Formoterol (BREZTRI AEROSPHERE) 160-9-4.8 MCG/ACT AERO Inhale 2 puffs into the lungs in the morning and at bedtime.  . budesonide (PULMICORT) 0.25 MG/2ML nebulizer solution USE 1 VIAL  IN  NEBULIZER TWICE  DAILY - Rinse Mouth After Treatment  . cefUROXime (CEFTIN) 250 MG tablet Take 1 tablet (250 mg total) by  mouth 2 (two) times daily with a meal.  . cetirizine (ZYRTEC) 10 MG tablet Take 10 mg by mouth at bedtime.   . Cholecalciferol (VITAMIN D) 2000 units CAPS Take 2,000 Units by mouth daily.   Marland Kitchen denosumab (PROLIA) 60 MG/ML SOSY injection Inject 60 mg into the skin every 6 (six) months.  . esomeprazole (NEXIUM) 40 MG capsule Take 1 capsule (40 mg total) by mouth daily at 12 noon.  . fluticasone (FLONASE) 50 MCG/ACT nasal spray Place 2 sprays into both nostrils every evening.  . gabapentin (NEURONTIN) 100 MG capsule TAKE 1 CAPSULE BY MOUTH 3 TIMES DAILY. (Patient taking differently: at bedtime. )  . ipratropium (ATROVENT) 0.02 % nebulizer solution USE 1 VIAL IN NEBULIZER 3 TIMES DAILY.  DX: J44.9  . Melatonin 10 MG TABS Take 10 mg by mouth at bedtime as needed.   . naproxen sodium (ALEVE) 220 MG tablet Take 440 mg by mouth 2 (two) times daily as needed (pain).   . OXYGEN Inhale 2 L into the lungs as needed. FOR USE WITH EXERTION   Current Facility-Administered Medications for the 11/18/20 encounter (Office Visit) with Quintella Reichert, MD  Medication  . denosumab (PROLIA) injection 60 mg    Allergies:   Prednisone   Social History   Socioeconomic History  . Marital status: Widowed    Spouse name: Not on file  . Number of children: Not on file  . Years of education: Not on file  . Highest education level: Not on file  Occupational History  . Occupation: on disability.  Tobacco Use  . Smoking status: Former Smoker    Packs/day: 2.00    Years: 40.00    Pack years: 80.00    Types: Cigarettes    Quit date: 07/31/2013    Years since quitting: 7.3  . Smokeless tobacco: Never Used  Vaping Use  . Vaping Use: Never used  Substance and Sexual Activity  . Alcohol use: No    Comment: OCC WINE   . Drug use: No  . Sexual activity: Not on file  Other Topics Concern  . Not on file  Social History Narrative   Divorced and lives alone.    Social Determinants of Health   Financial Resource  Strain: Low Risk   . Difficulty of Paying Living Expenses: Not very hard  Food Insecurity:   . Worried About Programme researcher, broadcasting/film/video in the Last Year: Not on file  . Ran Out of Food in the Last Year: Not on file  Transportation Needs:   . Lack of Transportation (Medical): Not on file  . Lack of Transportation (Non-Medical): Not on file  Physical Activity:   . Days of Exercise per Week: Not on file  . Minutes of  Exercise per Session: Not on file  Stress:   . Feeling of Stress : Not on file  Social Connections:   . Frequency of Communication with Friends and Family: Not on file  . Frequency of Social Gatherings with Friends and Family: Not on file  . Attends Religious Services: Not on file  . Active Member of Clubs or Organizations: Not on file  . Attends Banker Meetings: Not on file  . Marital Status: Not on file     Family History:  The patient's family history includes Allergies in her brother, mother, and son; Cancer in an other family member; Emphysema in her father; Heart disease in her maternal grandmother; Rheum arthritis in her brother.   ROS:   Please see the history of present illness.    ROS All other systems reviewed and are negative.  No flowsheet data found.     PHYSICAL EXAM:   VS:  BP 126/74   Pulse 85   Ht 5\' 1"  (1.549 m)   Wt 148 lb (67.1 kg)   SpO2 97% Comment: on 2-3 L Oxygen  BMI 27.96 kg/m    GEN: Well nourished, well developed, in no acute distress  HEENT: normal  Neck: no JVD, carotid bruits, or masses Cardiac: RRR; no murmurs, rubs, or gallops,no edema.  Intact distal pulses bilaterally.  Respiratory:  clear to auscultation bilaterally, normal work of breathing GI: soft, nontender, nondistended, + BS MS: no deformity or atrophy  Skin: warm and dry, no rash Neuro:  Alert and Oriented x 3, Strength and sensation are intact Psych: euthymic mood, full affect  Wt Readings from Last 3 Encounters:  11/18/20 148 lb (67.1 kg)  10/26/20  147 lb (66.7 kg)  10/08/20 146 lb (66.2 kg)      Studies/Labs Reviewed:   EKG:  EKG is ordered today.  The ekg ordered today demonstrates NSR with PACs  Recent Labs: 10/08/2020: ALT 12; Brain Natriuretic Peptide 25; BUN 16; Creat 0.87; Hemoglobin 14.5; Platelets 283; Potassium 4.1; Sodium 141   Lipid Panel    Component Value Date/Time   CHOL 201 (H) 04/20/2018 1535   TRIG 83 04/20/2018 1535   HDL 69 04/20/2018 1535   CHOLHDL 2.9 04/20/2018 1535   LDLCALC 114 (H) 04/20/2018 1535    Additional studies/ records that were reviewed today include:  Office notes from PCP    ASSESSMENT:    1. Chest pain of uncertain etiology   2. Palpitations      PLAN:  In order of problems listed above:  1. Chest pain -chest pain is concerning given her CRFs including remote tobacco, associated nausea and radiation into her jaw -EKG is nonischemic -I will get a coronary CTA to define coronary anatomy -check 2D echo to assess LVF -risks of coronary CTA reviewed including contrast dye reaction, AKI, MI, death and patient understands and wishes to proceed  2.   Palpitations/Pre-syncope -she has random palpitations lasting a few minutes at a time but more concerning are the "sinking" spells she is having even when at rest sitting in a chair which concerns me for possible arrhythmia -I will get a 30 day event monitor to assess for arrhythmias    Medication Adjustments/Labs and Tests Ordered: Current medicines are reviewed at length with the patient today.  Concerns regarding medicines are outlined above.  Medication changes, Labs and Tests ordered today are listed in the Patient Instructions below.  There are no Patient Instructions on file for this visit.  Signed, Armanda Magic, MD  11/18/2020 1:51 PM    [facility] [address] [place], [place], [place]  [address] Phone: [phone]; Fax: [phone]

## 2020-11-18 NOTE — Addendum Note (Signed)
Addended by: Theresia Majors on: 11/18/2020 01:58 PM   Modules accepted: Orders

## 2020-11-26 ENCOUNTER — Ambulatory Visit (INDEPENDENT_AMBULATORY_CARE_PROVIDER_SITE_OTHER): Payer: Medicare Other

## 2020-11-26 DIAGNOSIS — R002 Palpitations: Secondary | ICD-10-CM

## 2020-11-26 DIAGNOSIS — R072 Precordial pain: Secondary | ICD-10-CM

## 2020-11-26 DIAGNOSIS — R079 Chest pain, unspecified: Secondary | ICD-10-CM

## 2020-11-27 NOTE — Chronic Care Management (AMB) (Addendum)
Chronic Care Management Pharmacy  Name: Taylor Burnett  MRN: [id number] DOB: [birth year]  Chief Complaint/ HPI  Taylor Burnett,  69 y.o. , female presents for their Follow-Up CCM visit with the clinical pharmacist via telephone.  PCP : Donita Brooks, MD  Their chronic conditions include: COPD, GERD, Prediabetes, anxiety.  Office Visits: 10/08/2020 ([place]) - chest pain, referral to cards due to sinus arrhythmias  03/29/2020 [facility]) - presented with right side rib pain given Norco 5/325 and recommend x-rays if pain does not improve  Consult Visit: 11/18/2020 ([facility]) - patient was given a 30 day event monitor to wear to diagnose what may be going on.  04/22/2020 ([place], pulmonology) - did not tolerate prednisone, she is rotating ABX the first week of each month  03/02/2020 & 03/09/2020 ([place], pulmonology) - severe COPD with emphysema, started prednisone f/u in one month to ensure she is not having psychological effects (has had suicidal ideation prior with prednisone)  Medications: Outpatient Encounter Medications as of 12/03/2020  Medication Sig   Budeson-Glycopyrrol-Formoterol (BREZTRI AEROSPHERE) 160-9-4.8 MCG/ACT AERO Inhale 2 puffs into the lungs in the morning and at bedtime.   esomeprazole (NEXIUM) 40 MG capsule Take 1 capsule (40 mg total) by mouth daily at 12 noon.   fluticasone (FLONASE) 50 MCG/ACT nasal spray Place 2 sprays into both nostrils every evening.   albuterol (PROAIR HFA) 108 (90 Base) MCG/ACT inhaler INHALE 2 PUFFS INTO THE LUNGS EVERY 6 HOURS AS NEEDED.   BROVANA 15 MCG/2ML NEBU USE 1 VIAL  IN  NEBULIZER TWICE  DAILY - (Morning And Evening)   cefUROXime (CEFTIN) 250 MG tablet Take 1 tablet (250 mg total) by mouth 2 (two) times daily with a meal.   cetirizine (ZYRTEC) 10 MG tablet Take 10 mg by mouth at bedtime.    Cholecalciferol (VITAMIN D) 2000 units CAPS Take 2,000 Units by mouth daily.    gabapentin (NEURONTIN) 100 MG capsule TAKE 1 CAPSULE BY  MOUTH 3 TIMES DAILY. (Patient taking differently: at bedtime. )   ipratropium (ATROVENT) 0.02 % nebulizer solution USE 1 VIAL IN NEBULIZER 3 TIMES DAILY.  DX: J44.9   Melatonin 10 MG TABS Take 10 mg by mouth at bedtime as needed.    naproxen sodium (ALEVE) 220 MG tablet Take 440 mg by mouth 2 (two) times daily as needed (pain).    OXYGEN Inhale 2 L into the lungs as needed. FOR USE WITH EXERTION   [DISCONTINUED] budesonide (PULMICORT) 0.25 MG/2ML nebulizer solution USE 1 VIAL  IN  NEBULIZER TWICE  DAILY - Rinse Mouth After Treatment   [DISCONTINUED] denosumab (PROLIA) 60 MG/ML SOSY injection Inject 60 mg into the skin every 6 (six) months.   [DISCONTINUED] metoprolol tartrate (LOPRESSOR) 100 MG tablet Take 1 tablet (100 mg total) two hours prior to CT scan   Facility-Administered Encounter Medications as of 12/03/2020  Medication   denosumab (PROLIA) injection 60 mg     Current Diagnosis/Assessment:  Physicist, medical Strain: Low Risk    Difficulty of Paying Living Expenses: Not very hard     Goals Addressed             This Visit's Progress    Pharmacy Care Plan:       CARE PLAN ENTRY  Current Barriers:  Chronic Disease Management support, education, and care coordination needs related to GERD and COPD  GERD Pharmacist Clinical Goal(s) Over the next 120 days, patient will work with PharmD and providers to optimize medication and symptoms related to  GERD Current regimen:  Nexium 40mg  daily Interventions: Counseled on appropriate dosing and administration Discussed avoidance of trigger foods Patient self care activities - Over the next 120 days, patient will: Contact PharmD or PCP with increased symptoms Work on  COPD Pharmacist Clinical Goal(s) Over the next 120 days, patient will work with PharmD and providers to optimize medication and minimize symptoms related to COPD. Current regimen:  Proair HFA Warden/ranger  ipratropium .02%  nebulizer Brovana 44mcg/2ml neb Breztri 160-9-4.57mcg Interventions: Comprehensive medication review Discussed frequency of rescue inhaler use Patient self care activities - Over the next 180 days, patient will: Continue to take medication as directed. Contact PharmD or PCP with any medication related concerns or increase in symptoms related to COPD.  Please see past updates related to this goal by clicking on the "Past Updates" button in the selected goal          COPD    Gold Grade: Gold 4 (FEV1<40%) Current COPD Classification:  D (high sx, >/=2 exacerbations/yr)  Eosinophil count:   Lab Results  Component Value Date/Time   EOSPCT 2.4 10/08/2020 03:14 PM  %                               Eos (Absolute):  Lab Results  Component Value Date/Time   EOSABS 180 10/08/2020 03:14 PM    Tobacco Status:  Social History   Tobacco Use  Smoking Status Former Smoker   Packs/day: 2.00   Years: 40.00   Pack years: 80.00   Types: Cigarettes   Quit date: 07/31/2013   Years since quitting: 7.3  Smokeless Tobacco Never Used    Patient has failed these meds in past: Trelegy, Anoro Patient is currently controlled on the following medications:  Proair HFA 09/30/2013 ipratropium .02% nebulizer Brovana 52mcg/2ml neb Breztri 160-9-4.8 mcg Using maintenance inhaler regularly? Yes Frequency of rescue inhaler use:  daily  Patient has follow up appointment upcoming with pulmonology.  She reports her breathing has been about the same.  She is doing two nebulizer treatments daily along with two doses of Breztri.  Reports using her rescue inhaler average of twice daily, however it does not provide her much relief.  All meds are affordable at this time.  She does reports a little blurred vision ever since she started the [place].  Counseled that this is possible with the budesonide. Recommended follow up and mention it to pulmonology at her upcoming visit. Plan  Continue current medications.      Prediabetes   Recent Relevant Labs: Lab Results  Component Value Date/Time   HGBA1C 5.5 04/20/2018 03:35 PM   HGBA1C 5.2 06/19/2017 02:08 PM   HGBA1C 5.4 01/23/2017 02:02 PM   HGBA1C 5.8 (H) 06/06/2016 11:50 AM     Checking BG: Never   Patient is currently controlled on the following medications: none  Last diabetic Foot exam: No results found for: HMDIABEYEEXA  Last diabetic Eye exam: No results found for: HMDIABFOOTEX   Has not had A1c in 2 years, well controlled previously since 2017 A1c of 5.8.  Currently controlling with diet.  Plan  Continue control with diet. GERD   Patient has failed these meds in past: none noted Patient is currently uncontrolled on the following medications:  Nexium 40mg   She is back taking her nexium, reports some mild symptoms of acid reflux.  Counseled her on appropriate timing and administration of medication.  Plan  Continue  current medications.   Vaccines   Reviewed and discussed patient's vaccination history.    Immunization History  Administered Date(s) Administered   Pneumococcal Conjugate-13 07/30/2019   Pneumococcal Polysaccharide-23 04/20/2018   Tdap 06/06/2016    Plan  Recommended patient receive COVID-19 vaccine.  Medication Management   Miscellaneous medications: cetirizine 10mg ,  Flonase nasal spray, gabapentin 100mg  OTC's: naproxen 220mg , melatonin 10mg  Patient currently uses .  Phone #  [phone] Patient reports using pill box and medication drawers to organize medications and promote adherence. Patient denies missed doses of medication.   , PharmD Clinical Pharmacist Family Medicine [phone] I have collaborated with the care management provider regarding care management and care coordination activities outlined in this encounter and have reviewed this encounter including documentation in the note and care plan. I am certifying  that I agree with the content of this note and encounter as supervising physician.

## 2020-12-03 ENCOUNTER — Ambulatory Visit: Payer: Medicare Other

## 2020-12-03 DIAGNOSIS — K219 Gastro-esophageal reflux disease without esophagitis: Secondary | ICD-10-CM

## 2020-12-03 DIAGNOSIS — J438 Other emphysema: Secondary | ICD-10-CM

## 2020-12-03 NOTE — Patient Instructions (Addendum)
Visit Information  Goals Addressed            This Visit's Progress   . Pharmacy Care Plan:       CARE PLAN ENTRY  Current Barriers:  . Chronic Disease Management support, education, and care coordination needs related to GERD and COPD  GERD . Pharmacist Clinical Goal(s) o Over the next 120 days, patient will work with PharmD and providers to optimize medication and symptoms related to GERD . Current regimen:  o Nexium 40mg  daily . Interventions: o Counseled on appropriate dosing and administration o Discussed avoidance of trigger foods . Patient self care activities - Over the next 120 days, patient will: o Contact PharmD or PCP with increased symptoms o Work on trigger food elimination  COPD . Pharmacist Clinical Goal(s) o Over the next 120 days, patient will work with PharmD and providers to optimize medication and minimize symptoms related to COPD. . Current regimen:  o Proair HFA  o ipratropium .02% nebulizer o Brovana 71mcg/2ml neb o Breztri 160-9-4.23mcg . Interventions: o Comprehensive medication review o Discussed frequency of rescue inhaler use . Patient self care activities - Over the next 180 days, patient will: o Continue to take medication as directed. o Contact PharmD or PCP with any medication related concerns or increase in symptoms related to COPD.  Please see past updates related to this goal by clicking on the "Past Updates" button in the selected goal         The patient verbalized understanding of instructions, educational materials, and care plan provided today and agreed to receive a mailed copy of patient instructions, educational materials, and care plan.   Telephone follow up appointment with pharmacy team member scheduled for: 4 months  4m, PharmD Clinical Pharmacist [facility] [phone]   Food Choices for Gastroesophageal Reflux Disease, Adult When you have gastroesophageal reflux disease  (GERD), the foods you eat and your eating habits are very important. Choosing the right foods can help ease the discomfort of GERD. Consider working with a diet and nutrition specialist (dietitian) to help you make healthy food choices. What general guidelines should I follow?  Eating plan  Choose healthy foods low in fat, such as fruits, vegetables, whole grains, low-fat dairy products, and lean meat, fish, and poultry.  Eat frequent, small meals instead of three large meals each day. Eat your meals slowly, in a relaxed setting. Avoid bending over or lying down until 2-3 hours after eating.  Limit high-fat foods such as fatty meats or fried foods.  Limit your intake of oils, butter, and shortening to less than 8 teaspoons each day.  Avoid the following: ? Foods that cause symptoms. These may be different for different people. Keep a food diary to keep track of foods that cause symptoms. ? Alcohol. ? Drinking large amounts of liquid with meals. ? Eating meals during the 2-3 hours before bed.  Cook foods using methods other than frying. This may include baking, grilling, or broiling. Lifestyle  Maintain a healthy weight. Ask your health care provider what weight is healthy for you. If you need to lose weight, work with your health care provider to do so safely.  Exercise for at least 30 minutes on 5 or more days each week, or as told by your health care provider.  Avoid wearing clothes that fit tightly around your waist and chest.  Do not use any products that contain nicotine or tobacco, such as cigarettes and e-cigarettes. If  you need help quitting, ask your health care provider.  Sleep with the head of your bed raised. Use a wedge under the mattress or blocks under the bed frame to raise the head of the bed. What foods are not recommended? The items listed may not be a complete list. Talk with your dietitian about what dietary choices are best for you. Grains Pastries or quick  breads with added fat. [place] toast. Vegetables Deep fried vegetables. [place] fries. Any vegetables prepared with added fat. Any vegetables that cause symptoms. For some people this may include tomatoes and tomato products, chili peppers, onions and garlic, and horseradish. Fruits Any fruits prepared with added fat. Any fruits that cause symptoms. For some people this may include citrus fruits, such as oranges, grapefruit, pineapple, and lemons. Meats and other protein foods High-fat meats, such as fatty beef or pork, hot dogs, ribs, ham, sausage, salami and bacon. Fried meat or protein, including fried fish and fried chicken. Nuts and nut butters. Dairy Whole milk and chocolate milk. Sour cream. Cream. Ice cream. Cream cheese. Milk shakes. Beverages Coffee and tea, with or without caffeine. Carbonated beverages. Sodas. Energy drinks. Fruit juice made with acidic fruits (such as orange or grapefruit). Tomato juice. Alcoholic drinks. Fats and oils Butter. Margarine. Shortening. Ghee. Sweets and desserts Chocolate and cocoa. Donuts. Seasoning and other foods Pepper. Peppermint and spearmint. Any condiments, herbs, or seasonings that cause symptoms. For some people, this may include curry, hot sauce, or vinegar-based salad dressings. Summary  When you have gastroesophageal reflux disease (GERD), food and lifestyle choices are very important to help ease the discomfort of GERD.  Eat frequent, small meals instead of three large meals each day. Eat your meals slowly, in a relaxed setting. Avoid bending over or lying down until 2-3 hours after eating.  Limit high-fat foods such as fatty meat or fried foods. This information is not intended to replace advice given to you by your health care provider. Make sure you discuss any questions you have with your health care provider. Document Revised: 04/04/2019 Document Reviewed: 12/13/2016 Elsevier Patient Education  2020 [employer].

## 2020-12-04 ENCOUNTER — Telehealth: Payer: Self-pay

## 2020-12-10 ENCOUNTER — Other Ambulatory Visit: Payer: Self-pay

## 2020-12-10 ENCOUNTER — Encounter (HOSPITAL_COMMUNITY): Payer: Self-pay | Admitting: Emergency Medicine

## 2020-12-10 ENCOUNTER — Other Ambulatory Visit: Payer: Medicare Other

## 2020-12-10 DIAGNOSIS — R002 Palpitations: Secondary | ICD-10-CM

## 2020-12-10 DIAGNOSIS — R072 Precordial pain: Secondary | ICD-10-CM | POA: Diagnosis not present

## 2020-12-10 DIAGNOSIS — R079 Chest pain, unspecified: Secondary | ICD-10-CM | POA: Diagnosis not present

## 2020-12-11 ENCOUNTER — Telehealth (HOSPITAL_COMMUNITY): Payer: Self-pay | Admitting: Emergency Medicine

## 2020-12-11 LAB — BASIC METABOLIC PANEL
BUN/Creatinine Ratio: 25 (ref 12–28)
BUN: 22 mg/dL (ref 8–27)
CO2: 28 mmol/L (ref 20–29)
Calcium: 9.9 mg/dL (ref 8.7–10.3)
Chloride: 99 mmol/L (ref 96–106)
Creatinine, Ser: 0.87 mg/dL (ref 0.57–1.00)
GFR calc Af Amer: 79 mL/min/{1.73_m2} (ref >59)
GFR calc non Af Amer: 69 mL/min/{1.73_m2} (ref >59)
Glucose: 109 mg/dL — ABNORMAL HIGH (ref 65–99)
Potassium: 4.4 mmol/L (ref 3.5–5.2)
Sodium: 141 mmol/L (ref 134–144)

## 2020-12-11 NOTE — Telephone Encounter (Signed)
Reaching out to patient to offer assistance regarding upcoming cardiac imaging study; pt verbalizes understanding of appt date/time, parking situation and where to check in, pre-test NPO status and medications ordered, and verified current allergies; name and call back number provided for further questions should they arise Rockwell Alexandria RN Navigator Cardiac Imaging [facility] [phone] office [phone] cell   Pt to take 100mg  metoprolol 2 hr prior to scan 

## 2020-12-14 ENCOUNTER — Encounter: Payer: Medicare Other | Admitting: *Deleted

## 2020-12-14 ENCOUNTER — Ambulatory Visit (HOSPITAL_COMMUNITY)
Admission: RE | Admit: 2020-12-14 | Discharge: 2020-12-14 | Disposition: A | Discharge status: Home / Self Care | Payer: Medicare Other | Source: Ambulatory Visit | Attending: Cardiology | Admitting: Cardiology

## 2020-12-14 ENCOUNTER — Other Ambulatory Visit: Payer: Self-pay

## 2020-12-14 DIAGNOSIS — R072 Precordial pain: Secondary | ICD-10-CM | POA: Diagnosis not present

## 2020-12-14 DIAGNOSIS — Z006 Encounter for examination for normal comparison and control in clinical research program: Secondary | ICD-10-CM

## 2020-12-14 MED ORDER — IOHEXOL 350 MG/ML SOLN
80.0000 mL | Freq: Once | INTRAVENOUS | Status: AC | PRN
  Administered 2020-12-14: 80 mL via INTRAVENOUS

## 2020-12-14 MED ORDER — NITROGLYCERIN 0.4 MG SL SUBL
SUBLINGUAL_TABLET | SUBLINGUAL | Status: AC
  Filled 2020-12-14: qty 2

## 2020-12-14 MED ORDER — SODIUM CHLORIDE 0.9 % IV BOLUS
500.0000 mL | Freq: Once | INTRAVENOUS | Status: AC
  Administered 2020-12-14: 500 mL via INTRAVENOUS

## 2020-12-14 MED ORDER — NITROGLYCERIN 0.4 MG SL SUBL
0.8000 mg | SUBLINGUAL_TABLET | Freq: Once | SUBLINGUAL | Status: AC
  Administered 2020-12-14: 17:00:00 0.8 mg via SUBLINGUAL

## 2020-12-14 NOTE — Research (Signed)
Subject Name: Taylor Burnett  Subject met inclusion and exclusion criteria.  The informed consent form, study requirements and expectations were reviewed with the subject and questions and concerns were addressed prior to the signing of the consent form.  The subject verbalized understanding of the trial requirements.  The subject agreed to participate in the IDENTIFY trial and signed the informed consent at 1451 on 12/14/20  The informed consent was obtained prior to performance of any protocol-specific procedures for the subject.  A copy of the signed informed consent was given to the subject and a copy was placed in the subject's medical record.   [name]

## 2020-12-14 NOTE — Progress Notes (Signed)
Patient taken to main entrance via wheelchair. Systolic pressure in the 90s after 500 ml bolus . Patient denies any lightheadedness or dizziness. Patient states "she feels the same as she did before the scan".

## 2020-12-15 ENCOUNTER — Telehealth: Payer: Self-pay | Admitting: Cardiology

## 2020-12-15 DIAGNOSIS — R931 Abnormal findings on diagnostic imaging of heart and coronary circulation: Secondary | ICD-10-CM

## 2020-12-15 DIAGNOSIS — K219 Gastro-esophageal reflux disease without esophagitis: Secondary | ICD-10-CM

## 2020-12-15 NOTE — Telephone Encounter (Signed)
Elnita Maxwell from [facility] is calling with an over read on this pts Coronary CT, found by the Radiologist. Per Elnita Maxwell, over read showed right to middle lobe sub-solid nodule identified again, and Radiologist recommended that the pt have a Chest CT repeated in 12 months, for adenocarcinoma cannot be excluded.  Will send this information to Dr. Mayford Knife and primary covering RN this message as an FYI, and to follow-up with the pt accordingly thereafter.

## 2020-12-15 NOTE — Telephone Encounter (Signed)
[facility] calling with a call report on the patient's  Coronary CTA

## 2020-12-16 ENCOUNTER — Encounter: Payer: Self-pay | Admitting: Cardiology

## 2020-12-16 ENCOUNTER — Ambulatory Visit (INDEPENDENT_AMBULATORY_CARE_PROVIDER_SITE_OTHER): Payer: Medicare Other | Admitting: Emergency Medicine

## 2020-12-16 ENCOUNTER — Other Ambulatory Visit: Payer: Self-pay

## 2020-12-16 DIAGNOSIS — A319 Mycobacterial infection, unspecified: Secondary | ICD-10-CM | POA: Diagnosis not present

## 2020-12-16 DIAGNOSIS — J438 Other emphysema: Secondary | ICD-10-CM | POA: Diagnosis not present

## 2020-12-16 DIAGNOSIS — I2510 Atherosclerotic heart disease of native coronary artery without angina pectoris: Secondary | ICD-10-CM | POA: Insufficient documentation

## 2020-12-16 MED ORDER — ASPIRIN EC 81 MG PO TBEC: 81.0000 mg | DELAYED_RELEASE_TABLET | Freq: Every day | ORAL | 3 refills | Status: AC

## 2020-12-16 NOTE — Telephone Encounter (Signed)
Spoke to patient when she called back.  Set up labs, ASA 81 mg daily, Referral to GI, follow up with Dr. Mayford Knife and spoke about nodule and recommended follow up with PCP.   Patient requested I send result to her pulmonary MD as well as her PCP. States she has a repeat CT with Dr. Delton Coombes already set up for March.   Faxed results through epic.

## 2020-12-16 NOTE — Progress Notes (Signed)
Subjective:    Patient ID: Taylor Burnett, female    DOB: 06-29-1951, 69 y.o.   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  At her last office visit based on her persistent dyspnea even at rest we started prednisone 10 mg daily.  She unfortunately experienced depressive side effects, so it was stopped. She has minimal cough, no sputum. occasional wheeze Bronchodilator regimen includes Brovana, budesonide, scheduled ipratropium.  Her oxygen is at 2-3 l/min at all times.  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  We have tried her on daily prednisone but this was stopped due to side effects.  Last visit we started her on cycled antibiotics which she is taking and tolerating.  Currently managed on Brovana, budesonide, ipratropium nebs tid.  She has albuterol which she uses 1-2x a day, but tells me that it doesn't help her.   Most recent CT chest was 02/25/2019 that showed no evidence of interstitial disease or groundglass.  There was diffuse bronchial wall thickening, emphysema, multiple small pulmonary nodules similar to 2015.  She wears her O2 2-3L/min with most activity and often at rest. She is having increased cough and mucous compared with last time. She does best in the am.  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  She is on cycled antibiotics, has also been managed on Brovana, budesonide and ipratropium nebs.  We tried starting Breztri at her last visit to see if she would get more benefit, if this would offer similar control. She noticed that the [facility] didn't keep her as well controlled as the neb regimen - by 4pm most days she was more SOB. She is rotating azithro / ceftin / doxy monthly. She is on zyrtec, flonase.   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  She is on cycled antibiotics, does not tolerate prednisone.  At her last visit we tried restarting morning Brovana and ipratropium, continued afternoon Breztri because it did not seem that the morning dose of Breztri was holding her long enough through the day.  Today she reports that she prefers this routine although she feels about the same - good days and bad days.  She has been coughing more for the last 2 weeks, sometimes productive of thick white. Not currently on mucinex.  CT chest planned for March 2022 to follow her mycobacterial disease.   MDM: Reviewed cardiology notes from 11/18/2020  Review of Systems  Respiratory: Positive for shortness of breath.   As per HPI     Objective:   Physical Exam Vitals:   12/16/20 1444  BP: 120/78  Pulse: 85  SpO2: 98%  Weight: 146 lb 6.4 oz (66.4 kg)  Height: 5\' 1"  (1.549 m)   Gen: Pleasant, well-nourished, in no distress,  normal affect  ENT: No  lesions,  mouth clear,  oropharynx clear, no postnasal drip  Neck: No JVD, no stridor  Lungs: No use of accessory muscles, very distant, clear, no wheeze on normal breath.   Cardiovascular: RRR, heart sounds normal, no murmur or gallops, no peripheral edema  Musculoskeletal: No deformities, no cyanosis or clubbing  Neuro: alert, non  focal  Skin: Warm, no lesions or rashes      Assessment & Plan:  COPD (chronic obstructive pulmonary disease) with emphysema (HCC) Stable but very limited.  Unclear that there is anything I can do to optimize her regimen any further.  She does not tolerate prednisone.  Continue her rotating antibiotics.  Add Mucinex and she is having difficulty with thick white secretions.  Continue current bronchodilator regimen: Brovana and ipratropium nebs in the morning, then Breztri at midday and in the evening.  Please start guaifenesin (Mucinex) 600 mg twice a day. Please continue your Brovana and ipratropium nebs as you have been using them Please continue your [facility] on your current schedule.  Rinse and gargle after using. Keep your albuterol available to use either 2 puffs or 1 nebulizer treatment up to every 4 hours if needed for shortness of breath, chest tightness, wheezing. Continue your Zyrtec and Flonase Continue to rotate your antibiotics every month as you have been doing. Follow Dr. Delton Coombes in March after your CT so that we can review the results together.  Call if you have any problems and we will see you sooner.  Mycobacterium gordonae infection Planning for repeat CT scan of the chest in March 2022  Levy Pupa, MD, PhD 12/16/2020, 3:07 PM  Pulmonary and Critical Care [phone] or if no answer [phone]

## 2020-12-16 NOTE — Patient Instructions (Addendum)
Please start guaifenesin (Mucinex) 600 mg twice a day. Please continue your Brovana and ipratropium nebs as you have been using them Please continue your [facility] on your current schedule.  Rinse and gargle after using. Keep your albuterol available to use either 2 puffs or 1 nebulizer treatment up to every 4 hours if needed for shortness of breath, chest tightness, wheezing. Continue your Zyrtec and Flonase Continue to rotate your antibiotics every month as you have been doing. Get your CT scan of the chest in March as planned. Follow Dr. Delton Coombes in March after your CT so that we can review the results together.  Call if you have any problems and we will see you sooner.

## 2020-12-16 NOTE — Telephone Encounter (Signed)
Spoke to daughter on patients phone while patient was in appt with pulmonary doctor. Daughter Fleet Contras will have her mom call back when she gets out.

## 2020-12-16 NOTE — Assessment & Plan Note (Signed)
Stable but very limited.  Unclear that there is anything I can do to optimize her regimen any further.  She does not tolerate prednisone.  Continue her rotating antibiotics.  Add Mucinex and she is having difficulty with thick white secretions.  Continue current bronchodilator regimen: Brovana and ipratropium nebs in the morning, then Breztri at midday and in the evening.  Please start guaifenesin (Mucinex) 600 mg twice a day. Please continue your Brovana and ipratropium nebs as you have been using them Please continue your [facility] on your current schedule.  Rinse and gargle after using. Keep your albuterol available to use either 2 puffs or 1 nebulizer treatment up to every 4 hours if needed for shortness of breath, chest tightness, wheezing. Continue your Zyrtec and Flonase Continue to rotate your antibiotics every month as you have been doing. Follow Dr. Delton Coombes in March after your CT so that we can review the results together.  Call if you have any problems and we will see you sooner.

## 2020-12-16 NOTE — Telephone Encounter (Signed)
Patient states she is returning a call. May be regarding CT or lab results.

## 2020-12-16 NOTE — Assessment & Plan Note (Signed)
Planning for repeat CT scan of the chest in March 2022

## 2020-12-16 NOTE — Telephone Encounter (Signed)
-----   Message from Quintella Reichert, MD sent at 12/16/2020 11:53 AM EST ----- Coronary CTA showed mild non obstructive CAD.  Needs aggressive treatment of lipids.  Please have her come in for FLP and ALT and HbA1C.  Start ASA 81mg  daily. Refer to GI for possible GERD with esophageal spasm.  Followup with me in 6 weeks.    CT also showed a RML nodule that is stable from 2020 scan and recommended followup scan in 12 months along with benign nodule in LLL and emphysema - please forward report to PCP to followup on

## 2020-12-16 NOTE — Telephone Encounter (Signed)
Please make sure that patient knows she has a lung nodule and it needs followup - please set her up with an OV with PCP to discuss and forward this to PCP

## 2020-12-21 ENCOUNTER — Other Ambulatory Visit: Payer: Medicare Other

## 2020-12-21 ENCOUNTER — Encounter: Payer: Self-pay | Admitting: Nurse Practitioner

## 2020-12-24 ENCOUNTER — Other Ambulatory Visit: Payer: Self-pay | Admitting: Family Medicine

## 2020-12-24 ENCOUNTER — Telehealth: Payer: Self-pay | Admitting: Pharmacist

## 2020-12-24 ENCOUNTER — Other Ambulatory Visit: Payer: Self-pay | Admitting: Emergency Medicine

## 2020-12-24 NOTE — Progress Notes (Signed)
Verified Adherence Gap Information. Per insurance data, the patient did not perform her AWV visit. Patient did not get a breast cancer screening this year. Patient total gap equal to 5. Patient most recent A1C was 5.5 on 04/20/18. She met goal for being a non-tobacco user.    Charlann Lange, [place] Clinical Pharmacist Assistant [phone]

## 2020-12-25 ENCOUNTER — Other Ambulatory Visit: Payer: Self-pay | Admitting: Family Medicine

## 2020-12-28 ENCOUNTER — Other Ambulatory Visit: Payer: Medicare Other

## 2020-12-28 ENCOUNTER — Other Ambulatory Visit: Payer: Self-pay | Admitting: Family Medicine

## 2020-12-28 ENCOUNTER — Other Ambulatory Visit (HOSPITAL_COMMUNITY): Payer: Medicare Other

## 2020-12-28 MED ORDER — GABAPENTIN 100 MG PO CAPS: 100.0000 mg | ORAL_CAPSULE | Freq: Three times a day (TID) | ORAL | 0 refills | Status: DC

## 2020-12-30 ENCOUNTER — Telehealth: Payer: Self-pay

## 2020-12-30 ENCOUNTER — Encounter: Payer: Self-pay | Admitting: Cardiology

## 2020-12-30 DIAGNOSIS — I471 Supraventricular tachycardia: Secondary | ICD-10-CM | POA: Insufficient documentation

## 2020-12-30 DIAGNOSIS — I4719 Other supraventricular tachycardia: Secondary | ICD-10-CM | POA: Insufficient documentation

## 2020-12-30 MED ORDER — METOPROLOL SUCCINATE ER 25 MG PO TB24: 25.0000 mg | ORAL_TABLET | Freq: Every day | ORAL | 3 refills | Status: DC

## 2020-12-30 NOTE — Telephone Encounter (Signed)
The patient has been notified of the result and verbalized understanding.  All questions (if any) were answered. Theresia Majors, RN 12/30/2020 4:19 PM  Rx has been sent in.  Patient is already scheduled for an echocardiogram on 1/24 and FU visit with Dr. Mayford Knife on 2/1.

## 2020-12-30 NOTE — Telephone Encounter (Signed)
-----   Message from Quintella Reichert, MD sent at 12/30/2020 11:06 AM EST ----- Heart monitor showed extra heart beats from the top of the heart some in short runs.  Start Toprol XL 25mg  daily and followup with PA in 4 weeks.  Please get 2D echo

## 2021-01-07 ENCOUNTER — Ambulatory Visit: Payer: Medicare Other | Attending: Internal Medicine

## 2021-01-07 DIAGNOSIS — Z23 Encounter for immunization: Secondary | ICD-10-CM

## 2021-01-07 NOTE — Progress Notes (Signed)
   Covid-19 Vaccination Clinic  Name:  AILEEN AMORE    MRN: [id number] DOB: [birth year]  01/07/2021  Ms. Brahm was observed post Covid-19 immunization for 15 minutes without incident. She was provided with Vaccine Information Sheet and instruction to access the V-Safe system.   Ms. Forry was instructed to call 911 with any severe reactions post vaccine: Marland Kitchen Difficulty breathing  . Swelling of face and throat  . A fast heartbeat  . A bad rash all over body  . Dizziness and weakness   Immunizations Administered    Name Date Dose VIS Date Route   Pfizer COVID-19 Vaccine 01/07/2021  2:38 PM 0.3 mL 10/14/2020 Intramuscular   Manufacturer: [employer], [employer]   Lot: [id number]   NDC: [address]-4

## 2021-01-11 NOTE — Progress Notes (Deleted)
ASSESSMENT AND PLAN      HISTORY OF PRESENT ILLNESS     Primary Gastroenterologist : Chief Complaint :  Taylor Burnett is a 70 y.o. female with PMH / PSH significant for,  but not necessarily limited to: severe COPD   In late November 2021 patient was evaluated by Cardiology for non-exertional chest pain, radiating into jaw, nausea, palpitations and occasionally feeling that she was going to pass out. Chest pain reportedly relieved with ASA. Coronary CTA showed mild non-obstructive disease, a stable lung nodule, Recommendations included ggressive lipid management , daily ASA, referral to GI.for evaluation of GERD and possible esophageal spasm.     Data Reviewed:    Previous Endoscopic Evaluations / Pertinent Studies:    Past Medical History:  Diagnosis Date  . Arthritis   . CAD (coronary artery disease), native coronary artery    mild non obstructive CAD of the RCA, LCX, LAD up to 25-49% by coronary CTA 11/2020  . Cataract   . COPD (chronic obstructive pulmonary disease) (HCC)   . Cough   . DDD (degenerative disc disease), cervical   . Dysrhythmia    RAPID HEART BEAT OCC   . Emphysema of lung (HCC)   . GERD (gastroesophageal reflux disease)    OCC  . H/O degenerative disc disease   . Headache   . Myocardial infarction (HCC) 2016  . Osteoporosis   . Panic attacks   . PAT (paroxysmal atrial tachycardia) (HCC)    noted on event monitor 11/2020  . Pre-diabetes   . Severe lung disease   . Shortness of breath dyspnea   . Sleep apnea   . Spondylosis      Past Surgical History:  Procedure Laterality Date  . ANTERIOR CERVICAL DECOMP/DISCECTOMY FUSION N/A 03/13/2015   Procedure: CERVICAL THREE-CERVICAL FOUR, CERVICAL FOUR-CERVICAL FIVE, CERVICAL FIVE-CERVICAL SIX ANTERIOR CERVICAL DECOMPRESSION/DISCECTOMY FUSION 3 LEVELS;  Surgeon: Tia Alert, MD;  Location: [facility];  Service: Neurosurgery;  Laterality: N/A;  . BREAST ENHANCEMENT SURGERY  1990  .  CARDIAC CATHETERIZATION  1995  . CATARACT EXTRACTION W/PHACO Right 05/02/2017   Procedure: CATARACT EXTRACTION PHACO AND INTRAOCULAR LENS PLACEMENT (IOC);  Surgeon: Jethro Bolus, MD;  Location: [facility];  Service: Ophthalmology;  Laterality: Right;  CDE: 6.22  . CATARACT EXTRACTION W/PHACO Left 05/16/2017   Procedure: CATARACT EXTRACTION PHACO AND INTRAOCULAR LENS PLACEMENT (IOC);  Surgeon: Jethro Bolus, MD;  Location: [facility];  Service: Ophthalmology;  Laterality: Left;  CDE: 4.10  . TONSILLECTOMY    . TUBAL LIGATION    . VIDEO BRONCHOSCOPY Bilateral 12/31/2018   Procedure: VIDEO BRONCHOSCOPY WITHOUT FLUORO;  Surgeon: Leslye Peer, MD;  Location: [facility];  Service: Cardiopulmonary;  Laterality: Bilateral;   Family History  Problem Relation Age of Onset  . Emphysema Father   . Allergies Mother   . Allergies Son   . Allergies Brother   . Heart disease Maternal Grandmother   . Rheum arthritis Brother   . Cancer Other        aunts and uncles   Social History   Tobacco Use  . Smoking status: Former Smoker    Packs/day: 2.00    Years: 40.00    Pack years: 80.00    Types: Cigarettes    Quit date: 07/31/2013    Years since quitting: 7.4  . Smokeless tobacco: Never Used  Vaping Use  . Vaping Use: Never used  Substance Use Topics  . Alcohol use: No  Comment: OCC WINE   . Drug use: No   Current Outpatient Medications  Medication Sig Dispense Refill  . albuterol (PROAIR HFA) 108 (90 Base) MCG/ACT inhaler INHALE 2 PUFFS INTO THE LUNGS EVERY 6 HOURS AS NEEDED. 18 g 5  . aspirin EC 81 MG tablet Take 1 tablet (81 mg total) by mouth daily. Swallow whole. 90 tablet 3  . BROVANA 15 MCG/2ML NEBU USE 1 VIAL  IN  NEBULIZER TWICE  DAILY - (Morning And Evening) 120 mL 11  . Budeson-Glycopyrrol-Formoterol (BREZTRI AEROSPHERE) 160-9-4.8 MCG/ACT AERO Inhale 2 puffs into the lungs in the morning and at bedtime. 10.7 g 5  . cefUROXime (CEFTIN) 250 MG tablet Take 1 tablet (250 mg total) by mouth 2  (two) times daily with a meal. (Patient not taking: Reported on 12/16/2020) 14 tablet 2  . cetirizine (ZYRTEC) 10 MG tablet Take 10 mg by mouth at bedtime.     . Cholecalciferol (VITAMIN D) 2000 units CAPS Take 2,000 Units by mouth daily.     Marland Kitchen esomeprazole (NEXIUM) 40 MG capsule Take 1 capsule (40 mg total) by mouth daily at 12 noon. 31 capsule 3  . fluticasone (FLONASE) 50 MCG/ACT nasal spray Place 2 sprays into both nostrils every evening. 16 mL 12  . gabapentin (NEURONTIN) 100 MG capsule Take 1 capsule (100 mg total) by mouth 3 (three) times daily. 90 capsule 0  . ipratropium (ATROVENT) 0.02 % nebulizer solution USE 1 VIAL IN NEBULIZER 3 TIMES DAILY.  DX: J44.9 90 mL 11  . Melatonin 10 MG TABS Take 10 mg by mouth at bedtime as needed.     . metoprolol succinate (TOPROL XL) 25 MG 24 hr tablet Take 1 tablet (25 mg total) by mouth daily. 90 tablet 3  . naproxen sodium (ALEVE) 220 MG tablet Take 440 mg by mouth 2 (two) times daily as needed (pain).     . OXYGEN Inhale 2 L into the lungs as needed. FOR USE WITH EXERTION     Current Facility-Administered Medications  Medication Dose Route Frequency Provider Last Rate Last Admin  . denosumab (PROLIA) injection 60 mg  60 mg Subcutaneous Q6 months Donita Brooks, MD   60 mg at 02/13/19 1553   Allergies  Allergen Reactions  . Prednisone Other (See Comments)    Depression and suicidal     Review of Systems: Positive for ***.  All other systems reviewed and negative except where noted in HPI.   PHYSICAL EXAM :    Wt Readings from Last 3 Encounters:  12/16/20 146 lb 6.4 oz (66.4 kg)  11/18/20 148 lb (67.1 kg)  10/26/20 147 lb (66.7 kg)    There were no vitals taken for this visit. Constitutional:  Pleasant ***female in no acute distress. Psychiatric: Normal mood and affect. Behavior is normal. EENT: Pupils normal.  Conjunctivae are normal. No scleral icterus. Neck supple.  Cardiovascular: Normal rate, regular rhythm. No  edema Pulmonary/chest: Effort normal and breath sounds normal. No wheezing, rales or rhonchi. Abdominal: Soft, nondistended, nontender. Bowel sounds active throughout. There are no masses palpable. No hepatomegaly. Neurological: Alert and oriented to person place and time. Skin: Skin is warm and dry. No rashes noted.  Willette Cluster, NP  01/11/2021, 8:26 PM  Cc:  Referring Provider Donita Brooks, MD

## 2021-01-12 ENCOUNTER — Ambulatory Visit: Payer: Medicaid Other | Admitting: Nurse Practitioner

## 2021-01-18 ENCOUNTER — Other Ambulatory Visit: Payer: Self-pay

## 2021-01-18 ENCOUNTER — Ambulatory Visit (HOSPITAL_COMMUNITY): Payer: Medicare Other | Attending: Cardiology

## 2021-01-18 DIAGNOSIS — R002 Palpitations: Secondary | ICD-10-CM | POA: Diagnosis not present

## 2021-01-18 DIAGNOSIS — R079 Chest pain, unspecified: Secondary | ICD-10-CM | POA: Diagnosis not present

## 2021-01-18 DIAGNOSIS — R072 Precordial pain: Secondary | ICD-10-CM

## 2021-01-18 LAB — ECHOCARDIOGRAM COMPLETE
Area-P 1/2: 3.37 cm2
S' Lateral: 2.5 cm

## 2021-01-26 ENCOUNTER — Ambulatory Visit: Payer: Medicare Other | Admitting: Cardiology

## 2021-01-26 ENCOUNTER — Other Ambulatory Visit: Payer: Self-pay | Admitting: Emergency Medicine

## 2021-01-27 ENCOUNTER — Ambulatory Visit: Payer: Medicare Other | Admitting: Nurse Practitioner

## 2021-01-27 NOTE — Telephone Encounter (Signed)
Ok to fill? Last ov: 11/2220 Assessment & Plan:  COPD (chronic obstructive pulmonary disease) with emphysema (HCC) Stable but very limited.  Unclear that there is anything I can do to optimize her regimen any further.  She does not tolerate prednisone.  Continue her rotating antibiotics.  Add Mucinex and she is having difficulty with thick white secretions.  Continue current bronchodilator regimen: Brovana and ipratropium nebs in the morning, then Breztri at midday and in the evening.  Please start guaifenesin (Mucinex) 600 mg twice a day. Please continue your Brovana and ipratropium nebs as you have been using them Please continue your [facility] on your current schedule.  Rinse and gargle after using. Keep your albuterol available to use either 2 puffs or 1 nebulizer treatment up to every 4 hours if needed for shortness of breath, chest tightness, wheezing. Continue your Zyrtec and Flonase Continue to rotate your antibiotics every month as you have been doing. Follow Dr. Delton Coombes in March after your CT so that we can review the results together.  Call if you have any problems and we will see you sooner.  Mycobacterium gordonae infection Planning for repeat CT scan of the chest in March 2022  Levy Pupa, MD, PhD 12/16/2020, 3:07 PM [facility] [phone] or if no answer [phone]

## 2021-01-28 ENCOUNTER — Ambulatory Visit: Payer: Medicare Other | Attending: Internal Medicine

## 2021-01-28 DIAGNOSIS — Z23 Encounter for immunization: Secondary | ICD-10-CM

## 2021-01-28 NOTE — Progress Notes (Signed)
   Covid-19 Vaccination Clinic  Name:  Taylor Burnett    MRN: [id number] DOB: [birth year]  01/28/2021  [name] was observed post Covid-19 immunization for 15 minutes without incident. She was provided with Vaccine Information Sheet and instruction to access the V-Safe system.   [name] was instructed to call 911 with any severe reactions post vaccine: Marland Kitchen Difficulty breathing  . Swelling of face and throat  . A fast heartbeat  . A bad rash all over body  . Dizziness and weakness   Immunizations Administered    Name Date Dose VIS Date Route   PFIZER Comrnaty(Gray TOP) Covid-19 Vaccine 01/28/2021  2:16 PM 0.3 mL 12/03/2020 Intramuscular   Manufacturer: [employer], [employer]   Lot: [id number]   NDC: [address]-0      Covid-19 Vaccination Clinic  Name:  Taylor Burnett    MRN: [id number] DOB: 02-12-1951  01/28/2021  [name] was observed post Covid-19 immunization for 15 minutes without incident. She was provided with Vaccine Information Sheet and instruction to access the V-Safe system.   [name] was instructed to call 911 with any severe reactions post vaccine: Marland Kitchen Difficulty breathing  . Swelling of face and throat  . A fast heartbeat  . A bad rash all over body  . Dizziness and weakness   Immunizations Administered    Name Date Dose VIS Date Route   PFIZER Comrnaty(Gray TOP) Covid-19 Vaccine 01/28/2021  2:16 PM 0.3 mL 12/03/2020 Intramuscular   Manufacturer: [employer], [employer]   Lot: [id number]   NDC: [phone]

## 2021-02-04 ENCOUNTER — Ambulatory Visit: Payer: Medicare Other

## 2021-02-10 ENCOUNTER — Other Ambulatory Visit: Payer: Self-pay

## 2021-02-10 ENCOUNTER — Encounter: Payer: Self-pay | Admitting: Physician Assistant

## 2021-02-10 ENCOUNTER — Ambulatory Visit (INDEPENDENT_AMBULATORY_CARE_PROVIDER_SITE_OTHER): Payer: Medicare Other | Admitting: Physician Assistant

## 2021-02-10 VITALS — BP 102/68 | HR 88 | Ht 61.0 in | Wt 146.0 lb

## 2021-02-10 DIAGNOSIS — R142 Eructation: Secondary | ICD-10-CM | POA: Diagnosis not present

## 2021-02-10 DIAGNOSIS — R1310 Dysphagia, unspecified: Secondary | ICD-10-CM

## 2021-02-10 DIAGNOSIS — K219 Gastro-esophageal reflux disease without esophagitis: Secondary | ICD-10-CM

## 2021-02-10 DIAGNOSIS — R079 Chest pain, unspecified: Secondary | ICD-10-CM | POA: Diagnosis not present

## 2021-02-10 MED ORDER — PANTOPRAZOLE SODIUM 40 MG PO TBEC: 40.0000 mg | DELAYED_RELEASE_TABLET | Freq: Every day | ORAL | 11 refills | Status: DC

## 2021-02-10 NOTE — Patient Instructions (Addendum)
If you are age 69 or older, your body mass index should be between 23-30. Your Body mass index is 27.59 kg/m. If this is out of the aforementioned range listed, please consider follow up with your Primary Care Provider.  If you are age 87 or younger, your body mass index should be between 19-25. Your Body mass index is 27.59 kg/m. If this is out of the aformentioned range listed, please consider follow up with your Primary Care Provider.   You have been scheduled for an endoscopy. Please follow written instructions given to you at your visit today. If you use inhalers (even only as needed), please bring them with you on the day of your procedure.  Follow up an Anti-GERD Diet and Regimen.  Follow up pending at this time or as needed.  Thank you for entrusting me with your care and choosing [facility].  Amy Esterwood, PA-C

## 2021-02-10 NOTE — Progress Notes (Signed)
Subjective:    Patient ID: Taylor Burnett, female    DOB: 01/16/1951, 70 y.o.   MRN: [id number]  HPI Taylor Burnett is a pleasant 70 year old white female, new to GI today referred by [place] Ana Turner/cardiology for evaluation of upper GI symptoms. Patient has not had any prior GI evaluation. She does have history of coronary artery disease, PAT, COPD with chronic respiratory failure requiring O2 at 2 L nasal cannula, she also has a chronic Mycobacterium infection for which she is on cyclic antibiotics.  She has history of anxiety and prior cervical fusion. Patient reports that she did a Cologuard within the past 2 years which was negative.  She has not had prior colonoscopy.  She relates symptoms of belching and burping over the past several years which has been chronic.  Intermittent heartburn and indigestion and sense of very frequent irritated throat.  She has also been having more recent issues with solid food dysphagia over the past year.  For the most part does not have difficulty with liquids.  She has episodes occurring a couple of times a month with dysphagia to solids requiring regurgitation/vomiting.  Sometimes if she stops eating eventually food will pass.  She has more frequent vague symptoms of dysphagia with food traversing slowly.  She also has a chronic sensation of fullness in her throat. She has been on over-the-counter Nexium once daily, without any change in symptoms.  No prior EGD.  Review of Systems Pertinent positive and negative review of systems were noted in the above HPI section.  All other review of systems was otherwise negative.  Outpatient Encounter Medications as of 02/10/2021  Medication Sig  . albuterol (PROAIR HFA) 108 (90 Base) MCG/ACT inhaler INHALE 2 PUFFS INTO THE LUNGS EVERY 6 HOURS AS NEEDED.  Marland Kitchen aspirin EC 81 MG tablet Take 1 tablet (81 mg total) by mouth daily. Swallow whole.  Marland Kitchen BROVANA 15 MCG/2ML NEBU USE 1 VIAL  IN  NEBULIZER TWICE  DAILY - (Morning And Evening)   . Budeson-Glycopyrrol-Formoterol (BREZTRI AEROSPHERE) 160-9-4.8 MCG/ACT AERO Inhale 2 puffs into the lungs in the morning and at bedtime.  . cefUROXime (CEFTIN) 250 MG tablet Take 1 tablet (250 mg total) by mouth 2 (two) times daily with a meal.  . cetirizine (ZYRTEC) 10 MG tablet Take 10 mg by mouth at bedtime.   . Cholecalciferol (VITAMIN D) 2000 units CAPS Take 2,000 Units by mouth daily.   . fluticasone (FLONASE) 50 MCG/ACT nasal spray Place 2 sprays into both nostrils every evening.  . gabapentin (NEURONTIN) 100 MG capsule Take 1 capsule (100 mg total) by mouth 3 (three) times daily.  Marland Kitchen ipratropium (ATROVENT) 0.02 % nebulizer solution USE 1 VIAL IN NEBULIZER 3 TIMES DAILY.  DX: J44.9  . Melatonin 10 MG TABS Take 10 mg by mouth at bedtime as needed.   . metoprolol succinate (TOPROL XL) 25 MG 24 hr tablet Take 1 tablet (25 mg total) by mouth daily.  . naproxen sodium (ALEVE) 220 MG tablet Take 440 mg by mouth 2 (two) times daily as needed (pain).   . OXYGEN Inhale 2 L into the lungs as needed. FOR USE WITH EXERTION  . pantoprazole (PROTONIX) 40 MG tablet Take 1 tablet (40 mg total) by mouth daily.  . [DISCONTINUED] esomeprazole (NEXIUM) 40 MG capsule Take 1 capsule (40 mg total) by mouth daily at 12 noon.   Facility-Administered Encounter Medications as of 02/10/2021  Medication  . denosumab (PROLIA) injection 60 mg   Allergies  Allergen Reactions  .  Prednisone Other (See Comments)    Depression and suicidal   Patient Active Problem List   Diagnosis Date Noted  . PAT (paroxysmal atrial tachycardia) (HCC)   . CAD (coronary artery disease), native coronary artery   . Mycobacterium gordonae infection 03/11/2019  . Chronic cough 10/22/2018  . GERD (gastroesophageal reflux disease) 07/20/2018  . Anxiety 06/21/2017  . Prediabetes 06/21/2017  . Chronic respiratory failure (HCC) 04/18/2017  . Other insomnia 01/23/2017  . COPD (chronic obstructive pulmonary disease) with emphysema (HCC)  01/16/2017  . Sleep related hypoxia 01/16/2017  . Sleep apnea in adult 07/11/2016  . Chronic rhinitis 01/05/2016  . S/P cervical spinal fusion 03/13/2015  . Hemoptysis 01/01/2014  . Atypical chest pain 06/08/2012  . Neutropenia 12/23/2011  . Thrombocytopenia (HCC) 12/22/2011  . Hypoxemia 12/19/2011   Social History   Socioeconomic History  . Marital status: Widowed    Spouse name: Not on file  . Number of children: Not on file  . Years of education: Not on file  . Highest education level: Not on file  Occupational History  . Occupation: on disability.  Tobacco Use  . Smoking status: Former Smoker    Packs/day: 2.00    Years: 40.00    Pack years: 80.00    Types: Cigarettes    Quit date: 07/31/2013    Years since quitting: 7.5  . Smokeless tobacco: Never Used  Vaping Use  . Vaping Use: Never used  Substance and Sexual Activity  . Alcohol use: No    Comment: OCC WINE   . Drug use: No  . Sexual activity: Not on file  Other Topics Concern  . Not on file  Social History Narrative   Divorced and lives alone.    Social Determinants of Health   Financial Resource Strain: Low Risk   . Difficulty of Paying Living Expenses: Not very hard  Food Insecurity: Not on file  Transportation Needs: Not on file  Physical Activity: Not on file  Stress: Not on file  Social Connections: Not on file  Intimate Partner Violence: Not on file    Ms. Taylor Burnett family history includes Allergies in her brother, mother, and son; Cancer in an other family member; Emphysema in her father; Heart disease in her maternal grandmother; Rheum arthritis in her brother.      Objective:    Vitals:   02/10/21 1421  BP: 102/68  Pulse: 88    Physical Exam Well-developed well-nourished older white female in no acute distress.  Height, Weight, 146 BMI 27.5 on portable oxygen 2 L  HEENT; nontraumatic normocephalic, EOMI, PE R LA, sclera anicteric. Oropharynx; not examined today Neck; supple, no  JVD Cardiovascular; regular rate and rhythm with S1-S2, no murmur rub or gallop Pulmonary; Clear bilaterally, decreased breath sounds bilaterally Abdomen; soft, nontender, nondistended, no palpable mass or hepatosplenomegaly, bowel sounds are active Rectal; not done today Skin; benign exam, no jaundice rash or appreciable lesions Extremities; no clubbing cyanosis or edema skin warm and dry Neuro/Psych; alert and oriented x4, grossly nonfocal mood and affect appropriate       Assessment & Plan:   #40 70 year old white female with chronic symptoms of belching and burping, intermittent heartburn and indigestion consistent with GERD, and 1 year history of frequent mild dysphagia and occasional episodes of more significant solid food dysphagia requiring regurgitation.   suspect she has chronic acid reflux as primary issue and probable esophageal stricture.  Rule out motility disorder Consider component of aerophagia resulting in frequent belching and  patient with oxygen requiring COPD.  #2 chronic respiratory failure on O2 2 L, COPD #3 chronic Mycobacterium infection requiring cyclic antibiotics #4 coronary artery disease 5.  History of PAT 6.  Anxiety 7.  Status post cervical fusion #8 colon cancer screening-negative Cologuard within the past 2 years.  Plan; stop OTC Nexium and start Protonix 40 mg p.o. every morning AC breakfast. Start antireflux diet and antireflux regimen to include n.p.o. for 2 to 3 h prior to bedtime and elevation of the back 45 degrees while sleeping. Patient will be scheduled for upper endoscopy with possible esophageal dilation with Dr. Christella Hartigan.  Procedure will need to be scheduled at the hospital due to oxygen use. Patient has completed COVID-19 vaccination. Further recommendations pending findings at EGD.    Jolynn Bajorek Oswald Hillock PA-C 02/10/2021   Cc: Donita Brooks, MD

## 2021-02-11 ENCOUNTER — Ambulatory Visit (INDEPENDENT_AMBULATORY_CARE_PROVIDER_SITE_OTHER): Payer: Medicare Other | Admitting: Cardiology

## 2021-02-11 ENCOUNTER — Telehealth: Payer: Self-pay

## 2021-02-11 ENCOUNTER — Encounter: Payer: Self-pay | Admitting: Cardiology

## 2021-02-11 VITALS — BP 120/78 | HR 88 | Ht 61.0 in | Wt 146.0 lb

## 2021-02-11 DIAGNOSIS — I2510 Atherosclerotic heart disease of native coronary artery without angina pectoris: Secondary | ICD-10-CM | POA: Diagnosis not present

## 2021-02-11 DIAGNOSIS — I471 Supraventricular tachycardia: Secondary | ICD-10-CM

## 2021-02-11 DIAGNOSIS — E7800 Pure hypercholesterolemia, unspecified: Secondary | ICD-10-CM

## 2021-02-11 MED ORDER — DILTIAZEM HCL ER COATED BEADS 180 MG PO CP24: 180.0000 mg | ORAL_CAPSULE | Freq: Every day | ORAL | 3 refills | Status: DC

## 2021-02-11 NOTE — Telephone Encounter (Signed)
Patient returned call, she had already reviewed her instructions. I went over them with her over the phone and answered questions.

## 2021-02-11 NOTE — Addendum Note (Signed)
Addended by: Arville Care on: 02/11/2021 08:34 AM   Modules accepted: Orders

## 2021-02-11 NOTE — Patient Instructions (Signed)
Medication Instructions:  Your physician has recommended you make the following change in your medication: 1) START taking Cardizem (diltiazem) 180 mg daily  *If you need a refill on your cardiac medications before your next appointment, please call your pharmacy*  Lab Work: TODAY: FLP and ALT  If you have labs (blood work) drawn today and your tests are completely normal, you will receive your results only by: Marland Kitchen MyChart Message (if you have MyChart) OR . A paper copy in the mail If you have any lab test that is abnormal or we need to change your treatment, we will call you to review the results.  Follow-Up: At [facility], you and your health needs are our priority.  As part of our continuing mission to provide you with exceptional heart care, we have created designated Provider Care Teams.  These Care Teams include your primary Cardiologist (physician) and Advanced Practice Providers (APPs -  Physician Assistants and Nurse Practitioners) who all work together to provide you with the care you need, when you need it.  Your next appointment:   4 week(s)  The format for your next appointment:   In Person  Provider:   You will see one of the following Advanced Practice Providers on your designated Care Team:    Ronie Spies, PA-C  Jacolyn Reedy, PA-C  Then, Armanda Magic, MD will plan to see you again in 6 month(s).

## 2021-02-11 NOTE — Progress Notes (Signed)
Cardiology Office Note    Date:  02/11/2021   ID:  Taylor Burnett 06/17/1951, MRN [id number]  PCP:  Donita Brooks, MD  Cardiologist:  Armanda Magic, MD   Chief Complaint  Patient presents with  . Coronary Artery Disease  . Hyperlipidemia  . Palpitations    History of Present Illness:  Taylor Burnett is a 70 y.o. female with a hx of COPD, preDM and GERD who is referred by her PCP for evaluation of chest pain and palpitations.  When I initially saw her she was complaining of CP that had  been off and on for a few months.  It was nonexertional and would come on suddenly and intense in the mid sternal area and occasionally would radiate into her right side as well as in her jaw.  The pain was associated with nausea but no diaphoresis.  She has chronic DOE related to severe COPD and says that she is always very SOB.    She also noticed fluttering in her chest at random times and was having spells where suddenly she felt like she was going to pass out.  She has never had syncope.  This could occur even at rest when just sitting.   She is on O2 all night and occasionally during the day.  She is a former smoker.  Her maternal GM died of an MI in her 22's.   She underwent coronary CTA showing mild CAD in the prox to mid RCA, prox LAD and prox LCx with 25-49% stenosis.  2D echo showed normal LVF with mild MR.  Event monitor showed PACs and nonsustained atrial tachycardia with a run of PAT with aberration vs NSVT for 11 beats.  She was started on Toprol XL 25mg  daily but she had to stop it because she could not tolerate it due to fatigue and could not function.  She is here today for followup and is doing well.  She still has some pain in her chest that she describes as a squeezing sensation with no radiation.  She has chronic DOE due to COPD on chronic O2 at 2L.  She denies any PND, orthopnea, LE edema, dizziness or syncope. She occasionally will have the palpitations. She is compliant with  her meds and is tolerating meds with no SE.    Past Medical History:  Diagnosis Date  . Arthritis   . CAD (coronary artery disease), native coronary artery    mild non obstructive CAD of the RCA, LCX, LAD up to 25-49% by coronary CTA 11/2020  . Cataract   . COPD (chronic obstructive pulmonary disease) (HCC)   . Cough   . DDD (degenerative disc disease), cervical   . Dysrhythmia    RAPID HEART BEAT OCC   . Emphysema of lung (HCC)   . GERD (gastroesophageal reflux disease)    OCC  . H/O degenerative disc disease   . Headache   . Myocardial infarction (HCC) 2016  . Osteoporosis   . Panic attacks   . PAT (paroxysmal atrial tachycardia) (HCC)    noted on event monitor 11/2020  . Pre-diabetes   . Severe lung disease   . Shortness of breath dyspnea   . Sleep apnea   . Spondylosis     Past Surgical History:  Procedure Laterality Date  . ANTERIOR CERVICAL DECOMP/DISCECTOMY FUSION N/A 03/13/2015   Procedure: CERVICAL THREE-CERVICAL FOUR, CERVICAL FOUR-CERVICAL FIVE, CERVICAL FIVE-CERVICAL SIX ANTERIOR CERVICAL DECOMPRESSION/DISCECTOMY FUSION 3 LEVELS;  Surgeon: 03/15/2015,  MD;  Location: [facility];  Service: Neurosurgery;  Laterality: N/A;  . BREAST ENHANCEMENT SURGERY  1990  . CARDIAC CATHETERIZATION  1995  . CATARACT EXTRACTION W/PHACO Right 05/02/2017   Procedure: CATARACT EXTRACTION PHACO AND INTRAOCULAR LENS PLACEMENT (IOC);  Surgeon: Jethro Bolus, MD;  Location: [facility];  Service: Ophthalmology;  Laterality: Right;  CDE: 6.22  . CATARACT EXTRACTION W/PHACO Left 05/16/2017   Procedure: CATARACT EXTRACTION PHACO AND INTRAOCULAR LENS PLACEMENT (IOC);  Surgeon: Jethro Bolus, MD;  Location: [facility];  Service: Ophthalmology;  Laterality: Left;  CDE: 4.10  . TONSILLECTOMY    . TUBAL LIGATION    . VIDEO BRONCHOSCOPY Bilateral 12/31/2018   Procedure: VIDEO BRONCHOSCOPY WITHOUT FLUORO;  Surgeon: Leslye Peer, MD;  Location: [facility];  Service: Cardiopulmonary;  Laterality:  Bilateral;    Current Medications: Current Meds  Medication Sig  . albuterol (PROAIR HFA) 108 (90 Base) MCG/ACT inhaler INHALE 2 PUFFS INTO THE LUNGS EVERY 6 HOURS AS NEEDED.  Marland Kitchen aspirin EC 81 MG tablet Take 1 tablet (81 mg total) by mouth daily. Swallow whole.  Marland Kitchen azithromycin (ZITHROMAX) 250 MG tablet Take 250 mg by mouth as directed.  Marland Kitchen BROVANA 15 MCG/2ML NEBU USE 1 VIAL  IN  NEBULIZER TWICE  DAILY - (Morning And Evening)  . Budeson-Glycopyrrol-Formoterol (BREZTRI AEROSPHERE) 160-9-4.8 MCG/ACT AERO Inhale 2 puffs into the lungs in the morning and at bedtime.  . cefUROXime (CEFTIN) 250 MG tablet Take 1 tablet (250 mg total) by mouth 2 (two) times daily with a meal.  . cetirizine (ZYRTEC) 10 MG tablet Take 10 mg by mouth at bedtime.   . Cholecalciferol (VITAMIN D) 2000 units CAPS Take 2,000 Units by mouth daily.   Marland Kitchen doxycycline (VIBRA-TABS) 100 MG tablet Take 100 mg by mouth 2 (two) times daily.  . fluticasone (FLONASE) 50 MCG/ACT nasal spray Place 2 sprays into both nostrils every evening.  . gabapentin (NEURONTIN) 100 MG capsule Take 1 capsule (100 mg total) by mouth 3 (three) times daily.  Marland Kitchen ipratropium (ATROVENT) 0.02 % nebulizer solution USE 1 VIAL IN NEBULIZER 3 TIMES DAILY.  DX: J44.9  . Melatonin 10 MG TABS Take 10 mg by mouth at bedtime as needed.   . naproxen sodium (ALEVE) 220 MG tablet Take 440 mg by mouth 2 (two) times daily as needed (pain).   . OXYGEN Inhale 2 L into the lungs as needed. FOR USE WITH EXERTION  . pantoprazole (PROTONIX) 40 MG tablet Take 1 tablet (40 mg total) by mouth daily.   Current Facility-Administered Medications for the 02/11/21 encounter (Office Visit) with Quintella Reichert, MD  Medication  . denosumab (PROLIA) injection 60 mg    Allergies:   Prednisone and Toprol xl [metoprolol]   Social History   Socioeconomic History  . Marital status: Widowed    Spouse name: Not on file  . Number of children: Not on file  . Years of education: Not on file   . Highest education level: Not on file  Occupational History  . Occupation: on disability.  Tobacco Use  . Smoking status: Former Smoker    Packs/day: 2.00    Years: 40.00    Pack years: 80.00    Types: Cigarettes    Quit date: 07/31/2013    Years since quitting: 7.5  . Smokeless tobacco: Never Used  Vaping Use  . Vaping Use: Never used  Substance and Sexual Activity  . Alcohol use: No    Comment: OCC WINE   . Drug use:  No  . Sexual activity: Not on file  Other Topics Concern  . Not on file  Social History Narrative   Divorced and lives alone.    Social Determinants of Health   Financial Resource Strain: Low Risk   . Difficulty of Paying Living Expenses: Not very hard  Food Insecurity: Not on file  Transportation Needs: Not on file  Physical Activity: Not on file  Stress: Not on file  Social Connections: Not on file     Family History:  The patient's family history includes Allergies in her brother, mother, and son; Cancer in an other family member; Emphysema in her father; Heart disease in her maternal grandmother; Rheum arthritis in her brother.   ROS:   Please see the history of present illness.    ROS All other systems reviewed and are negative.  No flowsheet data found.     PHYSICAL EXAM:   VS:  BP 120/78   Pulse 88   Ht 5\' 1"  (1.549 m)   Wt 146 lb (66.2 kg)   SpO2 91%   BMI 27.59 kg/m    GEN: Well nourished, well developed in no acute distress HEENT: Normal NECK: No JVD; No carotid bruits LYMPHATICS: No lymphadenopathy CARDIAC:RRR, no murmurs, rubs, gallops RESPIRATORY:  Clear to auscultation without rales, wheezing or rhonchi  ABDOMEN: Soft, non-tender, non-distended MUSCULOSKELETAL:  No edema; No deformity  SKIN: Warm and dry NEUROLOGIC:  Alert and oriented x 3 PSYCHIATRIC:  Normal affect    Wt Readings from Last 3 Encounters:  02/11/21 146 lb (66.2 kg)  02/10/21 146 lb (66.2 kg)  12/16/20 146 lb 6.4 oz (66.4 kg)      Studies/Labs  Reviewed:   EKG:  EKG is not ordered today.    Coronary CTA 11/2020 Aorta: Normal size. Mild diffuse atherosclerotic plaque and calcifications. No dissection.  Aortic Valve:  Trileaflet.  No calcifications.  Coronary Arteries:  Normal coronary origin.  Right dominance.  RCA is a large dominant artery that gives rise to PDA and PLA. There is mild diffuse calcified plaque in the proximal and mid RCA with stenoses 25-49%. Distal RCA PLA and PDA have only minimal irregularities.  Left main is a large caliber, short artery that gives rise to LAD and LCX arteries. Left main has no plaque.  LAD is a large vessel that gives rise to a large diagonal artery. There is diffuse mild calcified plaque in the proximal LAD with stenosis 25-49%. Mid and distal LAD has small lumen and only luminal irregularities.  D1 is a large lumen artery that has minimal calcified plaque in the ostial and proximal segment with stenosis 0-25%. Mid and distal portion has only luminal irregularities.  LCX is a large non-dominant artery that gives rise to one large OM1 branch. There is mild plaque in the proximal LAD with stenosis 25-49%.  OM1 is a large artery with minimal plaque.  Other findings:  Normal pulmonary vein drainage into the left atrium.  Normal left atrial appendage without a thrombus.  Upper normal size of the pulmonary artery measuring 29 mm.  IMPRESSION: 1. Coronary calcium score of 289. This was 27 percentile for age and sex matched control.  2. Normal coronary origin with right dominance.  3. CAD-RADS 2. Mild non-obstructive CAD (25-49%) in the proximal portions of RCA, LAD and LCX arteries. Consider non-atherosclerotic causes of chest pain. Aggressive risk factor modification is Recommended.  2D echo 12/2020 IMPRESSIONS   1. Left ventricular ejection fraction, by estimation, is 60 to  65%. The  left ventricle has normal function. The left ventricle has no  regional  wall motion abnormalities. Left ventricular diastolic parameters are  consistent with Grade I diastolic  dysfunction (impaired relaxation).  2. Right ventricular systolic function is normal. The right ventricular  size is normal.  3. The mitral valve is normal in structure. Mild mitral valve  regurgitation. No evidence of mitral stenosis.  4. The aortic valve is normal in structure. Aortic valve regurgitation is  not visualized. No aortic stenosis is present.  5. The inferior vena cava is normal in size with greater than 50%  respiratory variability, suggesting right atrial pressure of 3 mmHg.   Event Monitor 12/2020 Study Highlights   Normal sinus rhytm and sinus tachycardia. The average heart rate in sinus was 84bpm and ranged from 64 to 128bpm.  Frequent PAC's, atrial couplets and nonsustained atrial tachycardia up to 188bpm.  Wide complex tachycardia for 11 beats possible nonsustained ventricular tachycardia vs. nonsustained atrial tachycardia with aberration.    Recent Labs: 10/08/2020: ALT 12; Brain Natriuretic Peptide 25; Hemoglobin 14.5; Platelets 283 12/10/2020: BUN 22; Creatinine, Ser 0.87; Potassium 4.4; Sodium 141   Lipid Panel    Component Value Date/Time   CHOL 201 (H) 04/20/2018 1535   TRIG 83 04/20/2018 1535   HDL 69 04/20/2018 1535   CHOLHDL 2.9 04/20/2018 1535   LDLCALC 114 (H) 04/20/2018 1535    Additional studies/ records that were reviewed today include:  Office notes from PCP    ASSESSMENT:    1. Coronary artery disease involving native coronary artery of native heart without angina pectoris   2. PAT (paroxysmal atrial tachycardia) (HCC)   3. Pure hypercholesterolemia      PLAN:  In order of problems listed above:  1.  ASCAD/Chest pain - coronary CTA showed mild CAD in the prox to mid RCA, prox LAD and prox LCx with 25-49% stenosis.  -CP felt to be noncardiac in origin -2D echo showed normal LVF with mild MR.  -she is  having an EGD next month for evaluation of CP -continue ASA -no BB due to fatigue on Toprol -starting CCB for suppression of palpitations -checking FLP and likely will start a statin  2.   Palpitations/PAT -she has random palpitations lasting a few minutes at a time but more concerning are the "sinking" spells she is having even when at rest sitting in a chair which concerns me for possible arrhythmia - Event monitor showed PACs and nonsustained atrial tachycardia with a run of PAT with aberration vs NSVT for 11 beats.  -Palpitations are still present but she had to stop the Toprol due to fatigue -I will start her on Cardizem CD 180mg  daily  3.  HLD -LDL goal < 70 -check FLP and ALT  Medication Adjustments/Labs and Tests Ordered: Current medicines are reviewed at length with the patient today.  Concerns regarding medicines are outlined above.  Medication changes, Labs and Tests ordered today are listed in the Patient Instructions below.  There are no Patient Instructions on file for this visit.   Signed, , MD  02/11/2021 2:45 PM    [facility] [address] [place], [place], [place]  [place] Phone: [phone]; Fax: [phone]

## 2021-02-11 NOTE — Telephone Encounter (Signed)
Left voicemail to return call to discuss Endoscopy scheduled at [facility].

## 2021-02-11 NOTE — Progress Notes (Signed)
I agree with the above note, plan 

## 2021-02-11 NOTE — Addendum Note (Signed)
Addended by: Theresia Majors on: 02/11/2021 02:57 PM   Modules accepted: Orders

## 2021-02-12 ENCOUNTER — Telehealth: Payer: Self-pay

## 2021-02-12 DIAGNOSIS — E7800 Pure hypercholesterolemia, unspecified: Secondary | ICD-10-CM

## 2021-02-12 LAB — LIPID PANEL
Chol/HDL Ratio: 2.5 ratio (ref 0.0–4.4)
Cholesterol, Total: 180 mg/dL (ref 100–199)
HDL: 73 mg/dL (ref >39)
LDL Chol Calc (NIH): 94 mg/dL (ref 0–99)
Triglycerides: 69 mg/dL (ref 0–149)
VLDL Cholesterol Cal: 13 mg/dL (ref 5–40)

## 2021-02-12 LAB — ALT: ALT: 15 IU/L (ref 0–32)

## 2021-02-12 MED ORDER — ATORVASTATIN CALCIUM 20 MG PO TABS: 20.0000 mg | ORAL_TABLET | Freq: Every day | ORAL | 3 refills | Status: DC

## 2021-02-12 NOTE — Telephone Encounter (Signed)
-----   Message from Quintella Reichert, MD sent at 02/12/2021  3:01 PM EST ----- LDL goal < 70 and LDL too high.  Start Atorvastatin 20mg  daily and repeat FLP and ALT in 6 weeks

## 2021-02-12 NOTE — Telephone Encounter (Signed)
The patient has been notified of the result and verbalized understanding.  All questions (if any) were answered. Theresia Majors, RN 02/12/2021 3:53 PM  Patient agreeable to start atorvastatin 20 mg. Will repeat labs in 6 weeks.

## 2021-02-17 ENCOUNTER — Telehealth: Payer: Self-pay | Admitting: Pharmacist

## 2021-02-17 NOTE — Progress Notes (Addendum)
Chronic Care Management Pharmacy Assistant   Name: Taylor Burnett  MRN: [id number] DOB: 07-31-1951  Reason for Encounter: General Disease State Call  Patient Questions:  1.  Have you seen any other providers since your last visit? Yes    2.  Any changes in your medicines or health? Yes   PCP : Donita Brooks, MD    Their chronic conditions include: COPD, GERD, Prediabetes, anxiety.  Office Visits: None since 12/24/20  Consults: 02/11/21 Cardio Quintella Reichert, MD. Labs drawn. STARTED Diltiazem HCL Coated Beads 180 mg daily.   02/10/21 Gastro Esterwood, Amy S, PA-C. Per note:The doctor wanted the patient to start antireflux diet and antireflux regimen to include n.p.o. for 2 to 3 h prior to bedtime and elevation of the back 45 degrees while sleeping.STARTED Pantoprazole Sodium 40 mg daily. STOPPED Esomeprazole Magnesium.  12/16/20 Pulmonology Byrum, Les Pou, MD. For COPD. START Guaifenesin 600 mg twice a day.  Allergies:   Allergies  Allergen Reactions   Prednisone Other (See Comments)    Depression and suicidal   Toprol Xl [Metoprolol]     Fatigue    Medications: Outpatient Encounter Medications as of 02/17/2021  Medication Sig   albuterol (PROAIR HFA) 108 (90 Base) MCG/ACT inhaler INHALE 2 PUFFS INTO THE LUNGS EVERY 6 HOURS AS NEEDED.   aspirin EC 81 MG tablet Take 1 tablet (81 mg total) by mouth daily. Swallow whole.   atorvastatin (LIPITOR) 20 MG tablet Take 1 tablet (20 mg total) by mouth daily.   azithromycin (ZITHROMAX) 250 MG tablet Take 250 mg by mouth as directed.   BROVANA 15 MCG/2ML NEBU USE 1 VIAL  IN  NEBULIZER TWICE  DAILY - (Morning And Evening)   Budeson-Glycopyrrol-Formoterol (BREZTRI AEROSPHERE) 160-9-4.8 MCG/ACT AERO Inhale 2 puffs into the lungs in the morning and at bedtime.   cefUROXime (CEFTIN) 250 MG tablet Take 1 tablet (250 mg total) by mouth 2 (two) times daily with a meal.   cetirizine (ZYRTEC) 10 MG tablet Take 10 mg by mouth at bedtime.     Cholecalciferol (VITAMIN D) 2000 units CAPS Take 2,000 Units by mouth daily.    diltiazem (CARDIZEM CD) 180 MG 24 hr capsule Take 1 capsule (180 mg total) by mouth daily.   doxycycline (VIBRA-TABS) 100 MG tablet Take 100 mg by mouth 2 (two) times daily.   fluticasone (FLONASE) 50 MCG/ACT nasal spray Place 2 sprays into both nostrils every evening.   gabapentin (NEURONTIN) 100 MG capsule Take 1 capsule (100 mg total) by mouth 3 (three) times daily.   ipratropium (ATROVENT) 0.02 % nebulizer solution USE 1 VIAL IN NEBULIZER 3 TIMES DAILY.  DX: J44.9   Melatonin 10 MG TABS Take 10 mg by mouth at bedtime as needed.    naproxen sodium (ALEVE) 220 MG tablet Take 440 mg by mouth 2 (two) times daily as needed (pain).    OXYGEN Inhale 2 L into the lungs as needed. FOR USE WITH EXERTION   pantoprazole (PROTONIX) 40 MG tablet Take 1 tablet (40 mg total) by mouth daily.   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   Chronic cough 10/22/2018   GERD (gastroesophageal reflux disease) 07/20/2018   Anxiety 06/21/2017   Prediabetes  06/21/2017   Chronic respiratory failure (HCC) 04/18/2017   Other insomnia 01/23/2017   COPD (chronic obstructive pulmonary disease) with emphysema (HCC) 01/16/2017   Sleep related hypoxia 01/16/2017   Sleep apnea in adult 07/11/2016   Chronic rhinitis 01/05/2016   S/P cervical spinal fusion 03/13/2015   Hemoptysis 01/01/2014   Atypical chest pain 06/08/2012   Neutropenia 12/23/2011   Thrombocytopenia (HCC) 12/22/2011   Hypoxemia 12/19/2011    Goals Addressed   None    GEN Call: Patient stated she does things around the house but she is unable to any exercising. She stated she  usually eats lunch and dinner, she's not a good breakfast eater. The patient stated she's been eating a lot salads some with baked chicken and fruits because she is trying to loose weight. Patient stated she is not concerned about her medications at this time because she gets help with them and they stay around the same price. She stated her acid reflux is not getting any better from the new medication pantoprazole 40 mg daily she started on 2/16 from her Gastrologist but she does have a endoscopy coming up on 3/31.  Follow-Up:  Pharmacist Review   Hulen Luster, RMA Clinical Pharmacist Assistant [phone]  10 minutes spent in review, coordination, and documentation.  Reviewed by: Willa Frater, PharmD Clinical Pharmacist [facility] [phone]

## 2021-02-22 ENCOUNTER — Other Ambulatory Visit: Payer: Self-pay | Admitting: Adult Health

## 2021-02-22 DIAGNOSIS — J438 Other emphysema: Secondary | ICD-10-CM

## 2021-02-22 DIAGNOSIS — R053 Chronic cough: Secondary | ICD-10-CM

## 2021-03-02 ENCOUNTER — Other Ambulatory Visit: Payer: Self-pay

## 2021-03-02 ENCOUNTER — Ambulatory Visit
Admission: RE | Admit: 2021-03-02 | Discharge: 2021-03-02 | Disposition: A | Discharge status: Home / Self Care | Payer: Medicare Other | Source: Ambulatory Visit | Attending: Emergency Medicine | Admitting: Emergency Medicine

## 2021-03-02 DIAGNOSIS — J438 Other emphysema: Secondary | ICD-10-CM

## 2021-03-02 DIAGNOSIS — R918 Other nonspecific abnormal finding of lung field: Secondary | ICD-10-CM | POA: Diagnosis not present

## 2021-03-05 ENCOUNTER — Encounter: Payer: Self-pay | Admitting: Emergency Medicine

## 2021-03-05 ENCOUNTER — Other Ambulatory Visit: Payer: Self-pay

## 2021-03-05 ENCOUNTER — Ambulatory Visit (INDEPENDENT_AMBULATORY_CARE_PROVIDER_SITE_OTHER): Payer: Medicare Other | Admitting: Emergency Medicine

## 2021-03-05 DIAGNOSIS — A319 Mycobacterial infection, unspecified: Secondary | ICD-10-CM

## 2021-03-05 DIAGNOSIS — J9611 Chronic respiratory failure with hypoxia: Secondary | ICD-10-CM

## 2021-03-05 DIAGNOSIS — J438 Other emphysema: Secondary | ICD-10-CM

## 2021-03-05 DIAGNOSIS — I2510 Atherosclerotic heart disease of native coronary artery without angina pectoris: Secondary | ICD-10-CM | POA: Diagnosis not present

## 2021-03-05 DIAGNOSIS — J310 Chronic rhinitis: Secondary | ICD-10-CM | POA: Diagnosis not present

## 2021-03-05 NOTE — Assessment & Plan Note (Signed)
Zyrtec and Flonase

## 2021-03-05 NOTE — Progress Notes (Signed)
   Subjective:    Patient ID: Taylor Burnett, female    DOB: January 08, 1951, 70 y.o.   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  She is on cycled antibiotics, does not tolerate prednisone.  At her last visit we tried restarting morning Brovana and ipratropium, continued afternoon Breztri because it did not seem that the morning dose of Breztri was holding her long enough through the day.  Today she reports that she prefers this routine although she feels about the same - good days and bad days.  She has been coughing more for the last 2 weeks, sometimes productive of thick white. Not currently on mucinex.  CT chest planned for March 2022 to follow her mycobacterial disease.   ROV 03/05/21 --70 year old woman with very severe COPD and associated chronic hypoxemic respiratory failure.  She does not tolerate prednisone but is on cycled antibiotics.  She is doing Brovana and ipratropium in the morning, Breztri mid day and then evening.  She is colonized with Mycobacterium gordonae and returns today to review her CT chest. Remains on allergy and GERD regimens. More dry cough lately especially in the am. She doesn;t [place] any albuterol because it doesn't seem to help her.   She is dyspneic with exertion. Her BD's don't hold her for 12 hours.   High-resolution CT scan of the chest done 03/03/2021 reviewed by me, shows stable scattered pulmonary nodules including stable 1.0 cm lateral right middle lobe groundglass nodule.    Review of Systems  Respiratory: Positive for shortness of breath.   As per HPI     Objective:   Physical Exam Vitals:   03/05/21 1431  BP: 108/64  Pulse: 89  Temp: 97.8 F (36.6 C)  TempSrc: Temporal  SpO2: 96%  Weight: 144 lb 9.6 oz (65.6 kg)  Height: 5\' 2"  (1.575  m)   Gen: Pleasant, well-nourished, in no distress,  normal affect  ENT: No lesions,  mouth clear,  oropharynx clear, no postnasal drip  Neck: No JVD, no stridor  Lungs: No use of accessory muscles, very distant, clear, no wheeze on normal breath.   Cardiovascular: RRR, heart sounds normal, no murmur or gallops, no peripheral edema  Musculoskeletal: No deformities, no cyanosis or clubbing  Neuro: alert, non focal  Skin: Warm, no lesions or rashes      Assessment & Plan:  COPD (chronic obstructive pulmonary disease) with emphysema (HCC) Please continue your and Rosalyn Gess as you have been taking them. Continue your rotating antibiotics as you have been using them. Wear your oxygen at all times as you have been using it. Follow Dr. Markus Daft in 6 months or sooner if you have new problems.  Chronic rhinitis Zyrtec and Flonase  Chronic respiratory failure (HCC) Continue oxygen as ordered  Mycobacterium gordonae infection Scattered pulmonary nodules.  Most prominent is a groundglass right middle lobe nodule.  May be associated with her mycobacterial disease versus slow-growing adenocarcinoma.  We need to continue to follow it annually.  Delton Coombes, MD, PhD 03/05/2021, 3:02 PM [facility] [phone] or if no answer [phone]

## 2021-03-05 NOTE — Assessment & Plan Note (Signed)
Scattered pulmonary nodules.  Most prominent is a groundglass right middle lobe nodule.  May be associated with her mycobacterial disease versus slow-growing adenocarcinoma.  We need to continue to follow it annually.

## 2021-03-05 NOTE — H&P (View-Only) (Signed)
   Subjective:    Patient ID: Taylor Burnett, female    DOB: 04/08/1951, 70 y.o.   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  She is on cycled antibiotics, does not tolerate prednisone.  At her last visit we tried restarting morning Brovana and ipratropium, continued afternoon Breztri because it did not seem that the morning dose of Breztri was holding her long enough through the day.  Today she reports that she prefers this routine although she feels about the same - good days and bad days.  She has been coughing more for the last 2 weeks, sometimes productive of thick white. Not currently on mucinex.  CT chest planned for March 2022 to follow her mycobacterial disease.   ROV 03/05/21 --70-year-old woman with very severe COPD and associated chronic hypoxemic respiratory failure.  She does not tolerate prednisone but is on cycled antibiotics.  She is doing Brovana and ipratropium in the morning, Breztri mid day and then evening.  She is colonized with Mycobacterium gordonae and returns today to review her CT chest. Remains on allergy and GERD regimens. More dry cough lately especially in the am. She doesn;t us any albuterol because it doesn't seem to help her.   She is dyspneic with exertion. Her BD's don't hold her for 12 hours.   High-resolution CT scan of the chest done 03/03/2021 reviewed by me, shows stable scattered pulmonary nodules including stable 1.0 cm lateral right middle lobe groundglass nodule.    Review of Systems  Respiratory: Positive for shortness of breath.   As per HPI     Objective:   Physical Exam Vitals:   03/05/21 1431  BP: 108/64  Pulse: 89  Temp: 97.8 F (36.6 C)  TempSrc: Temporal  SpO2: 96%  Weight: 144 lb 9.6 oz (65.6 kg)  Height: 5' 2" (1.575  m)   Gen: Pleasant, well-nourished, in no distress,  normal affect  ENT: No lesions,  mouth clear,  oropharynx clear, no postnasal drip  Neck: No JVD, no stridor  Lungs: No use of accessory muscles, very distant, clear, no wheeze on normal breath.   Cardiovascular: RRR, heart sounds normal, no murmur or gallops, no peripheral edema  Musculoskeletal: No deformities, no cyanosis or clubbing  Neuro: alert, non focal  Skin: Warm, no lesions or rashes      Assessment & Plan:  COPD (chronic obstructive pulmonary disease) with emphysema (HCC) Please continue your Brovana and Breztri as you have been taking them. Continue your rotating antibiotics as you have been using them. Wear your oxygen at all times as you have been using it. Follow Dr. Krisalyn Yankowski in 6 months or sooner if you have new problems.  Chronic rhinitis Zyrtec and Flonase  Chronic respiratory failure (HCC) Continue oxygen as ordered  Mycobacterium gordonae infection Scattered pulmonary nodules.  Most prominent is a groundglass right middle lobe nodule.  May be associated with her mycobacterial disease versus slow-growing adenocarcinoma.  We need to continue to follow it annually.  Hanalei Glace, MD, PhD 03/05/2021, 3:02 PM [facility] [phone] or if no answer [phone]  

## 2021-03-05 NOTE — Patient Instructions (Addendum)
Your CT scan of the chest shows stable inflammatory nodules.  We need to repeat your scan in 1 year. Please continue your Rosalyn Gess and Markus Daft as you have been taking them. Continue your rotating antibiotics as you have been using them. Wear your oxygen at all times as you have been using it. Continue your Zyrtec and Flonase as you take them. Follow Dr. Delton Coombes in 6 months or sooner if you have new problems.

## 2021-03-05 NOTE — Assessment & Plan Note (Signed)
Please continue your Rosalyn Gess and Markus Daft as you have been taking them. Continue your rotating antibiotics as you have been using them. Wear your oxygen at all times as you have been using it. Follow Dr. Delton Coombes in 6 months or sooner if you have new problems.

## 2021-03-05 NOTE — Assessment & Plan Note (Signed)
Continue oxygen as ordered 

## 2021-03-16 NOTE — Progress Notes (Deleted)
Cardiology Office Note    Date:  03/16/2021   ID:  Leisl, Denman [birth year], MRN [id number]   PCP:  Donita Brooks, MD   [facility]  Cardiologist:  Armanda Magic, MD *** Advanced Practice Provider:  No care team member to display Electrophysiologist:  None   [phone]   No chief complaint on file.   History of Present Illness:  Taylor Burnett is a 70 y.o. female with history of COPD on home O2, GERD, prediabetes, chronic dyspnea on exertion related to severe COPD, coronary CTA 11/2020 mild CAD in the proximal to mid RCA, proximal LAD and proximal circumflex with 25 to 49% stenosis.  2D echo 12/2020 normal LVEF mild MR, event monitor PACs and nonsustained atrial tachycardia with run of PAT with aberrancy versus NSVT for 11 beats treated with Toprol-XL.  She stopped this due to fatigue  Patient last saw Dr. Mayford Knife 02/11/2021 at which time she was still having some chest pain described as squeezing sensation at rest no radiation.  None diltiazem CD 180 mg daily and with heparin endoscopy for GERD.  Started on atorvastatin for hyperlipidemia.  Past Medical History:  Diagnosis Date  . Arthritis   . CAD (coronary artery disease), native coronary artery    mild non obstructive CAD of the RCA, LCX, LAD up to 25-49% by coronary CTA 11/2020  . Cataract   . COPD (chronic obstructive pulmonary disease) (HCC)   . Cough   . DDD (degenerative disc disease), cervical   . Dysrhythmia    RAPID HEART BEAT OCC   . Emphysema of lung (HCC)   . GERD (gastroesophageal reflux disease)    OCC  . H/O degenerative disc disease   . Headache   . Myocardial infarction (HCC) 2016  . Osteoporosis   . Panic attacks   . PAT (paroxysmal atrial tachycardia) (HCC)    noted on event monitor 11/2020  . Pre-diabetes   . Severe lung disease   . Shortness of breath dyspnea   . Sleep apnea   . Spondylosis     Past Surgical History:  Procedure Laterality Date  . ANTERIOR  CERVICAL DECOMP/DISCECTOMY FUSION N/A 03/13/2015   Procedure: CERVICAL THREE-CERVICAL FOUR, CERVICAL FOUR-CERVICAL FIVE, CERVICAL FIVE-CERVICAL SIX ANTERIOR CERVICAL DECOMPRESSION/DISCECTOMY FUSION 3 LEVELS;  Surgeon: Tia Alert, MD;  Location: [facility];  Service: Neurosurgery;  Laterality: N/A;  . BREAST ENHANCEMENT SURGERY  1990  . CARDIAC CATHETERIZATION  1995  . CATARACT EXTRACTION W/PHACO Right 05/02/2017   Procedure: CATARACT EXTRACTION PHACO AND INTRAOCULAR LENS PLACEMENT (IOC);  Surgeon: Jethro Bolus, MD;  Location: [facility];  Service: Ophthalmology;  Laterality: Right;  CDE: 6.22  . CATARACT EXTRACTION W/PHACO Left 05/16/2017   Procedure: CATARACT EXTRACTION PHACO AND INTRAOCULAR LENS PLACEMENT (IOC);  Surgeon: Jethro Bolus, MD;  Location: [facility];  Service: Ophthalmology;  Laterality: Left;  CDE: 4.10  . TONSILLECTOMY    . TUBAL LIGATION    . VIDEO BRONCHOSCOPY Bilateral 12/31/2018   Procedure: VIDEO BRONCHOSCOPY WITHOUT FLUORO;  Surgeon: Leslye Peer, MD;  Location: [facility];  Service: Cardiopulmonary;  Laterality: Bilateral;    Current Medications: No outpatient medications have been marked as taking for the 03/17/21 encounter (Appointment) with Dyann Kief, PA-C.   Current Facility-Administered Medications for the 03/17/21 encounter (Appointment) with Dyann Kief, PA-C  Medication  . denosumab (PROLIA) injection 60 mg     Allergies:   Prednisone and Toprol xl [metoprolol]   Social History  Socioeconomic History  . Marital status: Widowed    Spouse name: Not on file  . Number of children: Not on file  . Years of education: Not on file  . Highest education level: Not on file  Occupational History  . Occupation: on disability.  Tobacco Use  . Smoking status: Former Smoker    Packs/day: 2.00    Years: 40.00    Pack years: 80.00    Types: Cigarettes    Quit date: 07/31/2013    Years since quitting: 7.6  . Smokeless tobacco: Never Used  Vaping Use  .  Vaping Use: Never used  Substance and Sexual Activity  . Alcohol use: No    Comment: OCC WINE   . Drug use: No  . Sexual activity: Not on file  Other Topics Concern  . Not on file  Social History Narrative   Divorced and lives alone.    Social Determinants of Health   Financial Resource Strain: Low Risk   . Difficulty of Paying Living Expenses: Not very hard  Food Insecurity: Not on file  Transportation Needs: Not on file  Physical Activity: Not on file  Stress: Not on file  Social Connections: Not on file     Family History:  The patient's ***family history includes Allergies in her brother, mother, and son; Cancer in an other family member; Emphysema in her father; Heart disease in her maternal grandmother; Rheum arthritis in her brother.   ROS:   Please see the history of present illness.    ROS All other systems reviewed and are negative.   PHYSICAL EXAM:   VS:  There were no vitals taken for this visit.  Physical Exam  GEN: Well nourished, well developed, in no acute distress  HEENT: normal  Neck: no JVD, carotid bruits, or masses Cardiac:RRR; no murmurs, rubs, or gallops  Respiratory:  clear to auscultation bilaterally, normal work of breathing GI: soft, nontender, nondistended, + BS Ext: without cyanosis, clubbing, or edema, Good distal pulses bilaterally MS: no deformity or atrophy  Skin: warm and dry, no rash Neuro:  Alert and Oriented x 3, Strength and sensation are intact Psych: euthymic mood, full affect  Wt Readings from Last 3 Encounters:  03/05/21 144 lb 9.6 oz (65.6 kg)  02/11/21 146 lb (66.2 kg)  02/10/21 146 lb (66.2 kg)      Studies/Labs Reviewed:   EKG:  EKG is*** ordered today.  The ekg ordered today demonstrates ***  Recent Labs: 10/08/2020: Brain Natriuretic Peptide 25; Hemoglobin 14.5; Platelets 283 12/10/2020: BUN 22; Creatinine, Ser 0.87; Potassium 4.4; Sodium 141 02/11/2021: ALT 15   Lipid Panel    Component Value Date/Time    CHOL 180 02/11/2021 1503   TRIG 69 02/11/2021 1503   HDL 73 02/11/2021 1503   CHOLHDL 2.5 02/11/2021 1503   CHOLHDL 2.9 04/20/2018 1535   LDLCALC 94 02/11/2021 1503   LDLCALC 114 (H) 04/20/2018 1535    Additional studies/ records that were reviewed today include:   Coronary CTA 11/2020 Aorta: Normal size. Mild diffuse atherosclerotic plaque and calcifications. No dissection.   Aortic Valve:  Trileaflet.  No calcifications.   Coronary Arteries:  Normal coronary origin.  Right dominance.   RCA is a large dominant artery that gives rise to PDA and PLA. There is mild diffuse calcified plaque in the proximal and mid RCA with stenoses 25-49%. Distal RCA PLA and PDA have only minimal irregularities.   Left main is a large caliber, short artery that gives rise  to LAD and LCX arteries. Left main has no plaque.   LAD is a large vessel that gives rise to a large diagonal artery. There is diffuse mild calcified plaque in the proximal LAD with stenosis 25-49%. Mid and distal LAD has small lumen and only luminal irregularities.   D1 is a large lumen artery that has minimal calcified plaque in the ostial and proximal segment with stenosis 0-25%. Mid and distal portion has only luminal irregularities.   LCX is a large non-dominant artery that gives rise to one large OM1 branch. There is mild plaque in the proximal LAD with stenosis 25-49%.   OM1 is a large artery with minimal plaque.   Other findings:   Normal pulmonary vein drainage into the left atrium.   Normal left atrial appendage without a thrombus.   Upper normal size of the pulmonary artery measuring 29 mm.   IMPRESSION: 1. Coronary calcium score of 289. This was 10 percentile for age and sex matched control.   2. Normal coronary origin with right dominance.   3. CAD-RADS 2. Mild non-obstructive CAD (25-49%) in the proximal portions of RCA, LAD and LCX arteries. Consider non-atherosclerotic causes of chest pain.  Aggressive risk factor modification is Recommended.   2D echo 12/2020 IMPRESSIONS    1. Left ventricular ejection fraction, by estimation, is 60 to 65%. The  left ventricle has normal function. The left ventricle has no regional  wall motion abnormalities. Left ventricular diastolic parameters are  consistent with Grade I diastolic  dysfunction (impaired relaxation).   2. Right ventricular systolic function is normal. The right ventricular  size is normal.   3. The mitral valve is normal in structure. Mild mitral valve  regurgitation. No evidence of mitral stenosis.   4. The aortic valve is normal in structure. Aortic valve regurgitation is  not visualized. No aortic stenosis is present.   5. The inferior vena cava is normal in size with greater than 50%  respiratory variability, suggesting right atrial pressure of 3 mmHg.    Event Monitor 12/2020 Study Highlights    Normal sinus rhytm and sinus tachycardia. The average heart rate in sinus was 84bpm and ranged from 64 to 128bpm.  Frequent PAC's, atrial couplets and nonsustained atrial tachycardia up to 188bpm.  Wide complex tachycardia for 11 beats possible nonsustained ventricular tachycardia vs. nonsustained atrial tachycardia with aberration.       Risk Assessment/Calculations:   {Does this patient have ATRIAL FIBRILLATION?[phone]}     ASSESSMENT:    1. Coronary artery disease involving native coronary artery of native heart without angina pectoris   2. PAT (paroxysmal atrial tachycardia) (HCC)   3. Hyperlipidemia, unspecified hyperlipidemia type      PLAN:  In order of problems listed above:  CAD coronary CTA 11/2020 mild CAD in RCA LAD and circumflex 25 to 49%.  Chest pain felt to be noncardiac.  2D echo normal LVEF.  Started on a statin  PAT/palpitations started on diltiazem 180 mg once daily.  She did not tolerate beta-blocker due to fatigue  Hyperlipidemia LDL 94 02/11/2021 started on  atorvastatin   Shared Decision Making/Informed Consent   {Are you ordering a CV Procedure (e.g. stress test, cath, DCCV, TEE, etc)?   Press F2        :[id number]}    Medication Adjustments/Labs and Tests Ordered: Current medicines are reviewed at length with the patient today.  Concerns regarding medicines are outlined above.  Medication changes, Labs and Tests ordered today are listed in  the Patient Instructions below. There are no Patient Instructions on file for this visit.   Elson Clan, PA-C  03/16/2021 10:21 AM    [facility] [address] [place], [place], [place]  [address] Phone: [phone]; Fax: [phone]

## 2021-03-17 ENCOUNTER — Ambulatory Visit: Payer: Medicare Other | Admitting: Physician Assistant

## 2021-03-19 ENCOUNTER — Other Ambulatory Visit: Payer: Self-pay

## 2021-03-20 ENCOUNTER — Other Ambulatory Visit (HOSPITAL_COMMUNITY): Payer: Medicare Other

## 2021-03-22 ENCOUNTER — Other Ambulatory Visit (HOSPITAL_COMMUNITY)
Admission: RE | Admit: 2021-03-22 | Discharge: 2021-03-22 | Disposition: A | Discharge status: Home / Self Care | Payer: Medicare Other | Source: Ambulatory Visit | Attending: Gastroenterology | Admitting: Gastroenterology

## 2021-03-22 DIAGNOSIS — Z01812 Encounter for preprocedural laboratory examination: Secondary | ICD-10-CM | POA: Diagnosis not present

## 2021-03-22 DIAGNOSIS — Z20822 Contact with and (suspected) exposure to covid-19: Secondary | ICD-10-CM | POA: Diagnosis not present

## 2021-03-23 LAB — SARS CORONAVIRUS 2 (TAT 6-24 HRS): SARS Coronavirus 2: NEGATIVE

## 2021-03-25 ENCOUNTER — Ambulatory Visit (HOSPITAL_COMMUNITY)
Admission: RE | Admit: 2021-03-25 | Discharge: 2021-03-25 | Disposition: A | Discharge status: Home / Self Care | Payer: Medicare Other | Attending: Gastroenterology | Admitting: Gastroenterology

## 2021-03-25 ENCOUNTER — Ambulatory Visit (HOSPITAL_COMMUNITY): Payer: Medicare Other | Admitting: Certified Registered Nurse Anesthetist

## 2021-03-25 ENCOUNTER — Other Ambulatory Visit: Payer: Self-pay

## 2021-03-25 ENCOUNTER — Encounter (HOSPITAL_COMMUNITY)
Admission: RE | Disposition: A | Discharge status: Home / Self Care | Payer: Self-pay | Source: Home / Self Care | Attending: Gastroenterology

## 2021-03-25 ENCOUNTER — Encounter (HOSPITAL_COMMUNITY): Payer: Self-pay | Admitting: Gastroenterology

## 2021-03-25 DIAGNOSIS — I2510 Atherosclerotic heart disease of native coronary artery without angina pectoris: Secondary | ICD-10-CM | POA: Insufficient documentation

## 2021-03-25 DIAGNOSIS — K449 Diaphragmatic hernia without obstruction or gangrene: Secondary | ICD-10-CM | POA: Diagnosis not present

## 2021-03-25 DIAGNOSIS — K2970 Gastritis, unspecified, without bleeding: Secondary | ICD-10-CM

## 2021-03-25 DIAGNOSIS — Z9981 Dependence on supplemental oxygen: Secondary | ICD-10-CM | POA: Insufficient documentation

## 2021-03-25 DIAGNOSIS — I252 Old myocardial infarction: Secondary | ICD-10-CM | POA: Insufficient documentation

## 2021-03-25 DIAGNOSIS — R079 Chest pain, unspecified: Secondary | ICD-10-CM

## 2021-03-25 DIAGNOSIS — K222 Esophageal obstruction: Secondary | ICD-10-CM | POA: Diagnosis not present

## 2021-03-25 DIAGNOSIS — J9611 Chronic respiratory failure with hypoxia: Secondary | ICD-10-CM | POA: Insufficient documentation

## 2021-03-25 DIAGNOSIS — J449 Chronic obstructive pulmonary disease, unspecified: Secondary | ICD-10-CM | POA: Insufficient documentation

## 2021-03-25 DIAGNOSIS — R1310 Dysphagia, unspecified: Secondary | ICD-10-CM | POA: Diagnosis not present

## 2021-03-25 DIAGNOSIS — G4730 Sleep apnea, unspecified: Secondary | ICD-10-CM | POA: Insufficient documentation

## 2021-03-25 DIAGNOSIS — K2289 Other specified disease of esophagus: Secondary | ICD-10-CM | POA: Diagnosis not present

## 2021-03-25 DIAGNOSIS — Z87891 Personal history of nicotine dependence: Secondary | ICD-10-CM | POA: Diagnosis not present

## 2021-03-25 DIAGNOSIS — K219 Gastro-esophageal reflux disease without esophagitis: Secondary | ICD-10-CM | POA: Insufficient documentation

## 2021-03-25 DIAGNOSIS — K2950 Unspecified chronic gastritis without bleeding: Secondary | ICD-10-CM | POA: Insufficient documentation

## 2021-03-25 DIAGNOSIS — R142 Eructation: Secondary | ICD-10-CM

## 2021-03-25 DIAGNOSIS — J9610 Chronic respiratory failure, unspecified whether with hypoxia or hypercapnia: Secondary | ICD-10-CM | POA: Diagnosis not present

## 2021-03-25 HISTORY — PX: ESOPHAGOGASTRODUODENOSCOPY (EGD) WITH PROPOFOL: SHX5813

## 2021-03-25 HISTORY — PX: BIOPSY: SHX5522

## 2021-03-25 SURGERY — ESOPHAGOGASTRODUODENOSCOPY (EGD) WITH PROPOFOL
Anesthesia: Monitor Anesthesia Care

## 2021-03-25 MED ORDER — LACTATED RINGERS IV SOLN
INTRAVENOUS | Status: DC
  Administered 2021-03-25: 1000 mL via INTRAVENOUS

## 2021-03-25 MED ORDER — PROPOFOL 10 MG/ML IV BOLUS
INTRAVENOUS | Status: DC | PRN
  Administered 2021-03-25: 20 mg via INTRAVENOUS
  Administered 2021-03-25: 40 mg via INTRAVENOUS
  Administered 2021-03-25: 20 mg via INTRAVENOUS

## 2021-03-25 MED ORDER — LIDOCAINE HCL (CARDIAC) PF 100 MG/5ML IV SOSY
PREFILLED_SYRINGE | INTRAVENOUS | Status: DC | PRN
  Administered 2021-03-25: 100 mg via INTRAVENOUS

## 2021-03-25 MED ORDER — FLUCONAZOLE 50 MG PO TABS: 100.0000 mg | ORAL_TABLET | Freq: Every day | ORAL | 0 refills | Status: AC

## 2021-03-25 MED ORDER — PROPOFOL 500 MG/50ML IV EMUL
INTRAVENOUS | Status: DC | PRN
  Administered 2021-03-25: 120 ug/kg/min via INTRAVENOUS

## 2021-03-25 MED ORDER — SODIUM CHLORIDE 0.9 % IV SOLN: INTRAVENOUS | Status: DC

## 2021-03-25 SURGICAL SUPPLY — 15 items

## 2021-03-25 NOTE — Anesthesia Postprocedure Evaluation (Signed)
Anesthesia Post Note  Patient: Taylor Burnett  Procedure(s) Performed: ESOPHAGOGASTRODUODENOSCOPY (EGD) WITH PROPOFOL (N/A ) BIOPSY     Patient location during evaluation: PACU Anesthesia Type: MAC Level of consciousness: awake and alert Pain management: pain level controlled Vital Signs Assessment: post-procedure vital signs reviewed and stable Respiratory status: spontaneous breathing, nonlabored ventilation, respiratory function stable and patient connected to nasal cannula oxygen Cardiovascular status: stable and blood pressure returned to baseline Postop Assessment: no apparent nausea or vomiting Anesthetic complications: no   No complications documented.  Last Vitals:  Vitals:   03/25/21 0706 03/25/21 0746  BP: 94/62 107/62  Pulse: 88 84  Resp: 18 19  Temp: 36.6 C 36.6 C  SpO2: 100% 97%    Last Pain:  Vitals:   03/25/21 0746  TempSrc: Axillary  PainSc: 0-No pain                 Aniella Wandrey S

## 2021-03-25 NOTE — Op Note (Signed)
[facility] Patient Name: Taylor Burnett Procedure Date: 03/25/2021 MRN: [id number] Attending MD: Rachael Fee , MD Date of Birth: 1951/12/26 CSN: [id number] Age: 70 Admit Type: Outpatient Procedure:                Upper GI endoscopy Indications:              Dyspepsia, Dysphagia Providers:                Rachael Fee, MD, Dwain Sarna, RN, Lawson Radar, Technician, Crista Elliot Referring MD:              Medicines:                Monitored Anesthesia Care Complications:            No immediate complications. Estimated blood loss:                            None. Estimated Blood Loss:     Estimated blood loss: none. Procedure:                Pre-Anesthesia Assessment:                           - Prior to the procedure, a History and Physical                            was performed, and patient medications and                            allergies were reviewed. The patient's tolerance of                            previous anesthesia was also reviewed. The risks                            and benefits of the procedure and the sedation                            options and risks were discussed with the patient.                            All questions were answered, and informed consent                            was obtained. Prior Anticoagulants: The patient has                            taken no previous anticoagulant or antiplatelet                            agents. ASA Grade Assessment: IV - A patient with  severe systemic disease that is a constant threat                            to life. After reviewing the risks and benefits,                            the patient was deemed in satisfactory condition to                            undergo the procedure.                           After obtaining informed consent, the endoscope was                            passed under direct vision. Throughout  the                            procedure, the patient's blood pressure, pulse, and                            oxygen saturations were monitored continuously. The                            [id number]-H190 ([id number] Olympus gastroscope was                            introduced through the mouth, and advanced to the                            second part of duodenum. The upper GI endoscopy was                            accomplished without difficulty. The patient                            tolerated the procedure well. Scope In: Scope Out: Findings:      Multiple white exudative lesion throughout mid and proximal esophagus,       consistent with Candida esophagitis.      Very minor, thin, non-obstructing Schatzki's ring at the GE junction       above a small hiatal hernia.      Mild inflammation characterized by erythema and friability was found in       the gastric antrum. Biopsies were taken with a cold forceps for       histology.      The exam was otherwise without abnormality. Impression:               - Multiple white exudative lesion throughout mid                            and proximal esophagus, consistent with Candida                            esophagitis.                           -  Very minor, thin, non-obstructing Schatzki's ring                            at the GE junction above a small hiatal hernia.                           - Mild, non-specific gastritis; biopsied. Moderate Sedation:      Not Applicable - Patient had care per Anesthesia. Recommendation:           - Patient has a contact number available for                            emergencies. The signs and symptoms of potential                            delayed complications were discussed with the                            patient. Return to normal activities tomorrow.                            Written discharge instructions were provided to the                            patient.                           -  Resume previous diet.                           - Continue present medications. New start diflucan                            100mg  pills, one pill daily for 10 days for your                            yeast infection.                           - Await pathology results from gastric biopsies. Procedure Code(s):        --- Professional ---                           [phone], Esophagogastroduodenoscopy, flexible,                            transoral; with biopsy, single or multiple Diagnosis Code(s):        --- Professional ---                           K29.70, Gastritis, unspecified, without bleeding                           R10.13, Epigastric pain  R13.10, Dysphagia, unspecified CPT copyright 2019 American Medical Association. All rights reserved. The codes documented in this report are preliminary and upon coder review may  be revised to meet current compliance requirements. Rachael Fee, MD 03/25/2021 7:56:35 AM This report has been signed electronically. Number of Addenda: 0

## 2021-03-25 NOTE — Interval H&P Note (Signed)
History and Physical Interval Note:  03/25/2021 7:05 AM  Taylor Burnett  has presented today for surgery, with the diagnosis of Dysphagia, GERD, Belching.  The various methods of treatment have been discussed with the patient and family. After consideration of risks, benefits and other options for treatment, the patient has consented to  Procedure(s) with comments: ESOPHAGOGASTRODUODENOSCOPY (EGD) WITH PROPOFOL (N/A) - With possible dilation as a surgical intervention.  The patient's history has been reviewed, patient examined, no change in status, stable for surgery.  I have reviewed the patient's chart and labs.  Questions were answered to the patient's satisfaction.     Rachael Fee

## 2021-03-25 NOTE — Transfer of Care (Signed)
Immediate Anesthesia Transfer of Care Note  Patient: Taylor Burnett  Procedure(s) Performed: ESOPHAGOGASTRODUODENOSCOPY (EGD) WITH PROPOFOL (N/A ) BIOPSY  Patient Location: Endoscopy Unit  Anesthesia Type:MAC  Level of Consciousness: awake, alert , oriented and patient cooperative  Airway & Oxygen Therapy: Patient Spontanous Breathing and Patient connected to face mask oxygen  Post-op Assessment: Report given to RN and Post -op Vital signs reviewed and stable  Post vital signs: Reviewed and stable  Last Vitals:  Vitals Value Taken Time  BP 107/62 03/25/21 0745  Temp    Pulse 84 03/25/21 0746  Resp 22 03/25/21 0746  SpO2 96 % 03/25/21 0746  Vitals shown include unvalidated device data.  Last Pain:  Vitals:   03/25/21 0706  TempSrc: Oral  PainSc: 0-No pain         Complications: No complications documented.

## 2021-03-25 NOTE — Discharge Instructions (Signed)
YOU HAD AN ENDOSCOPIC PROCEDURE TODAY AT THE [facility]:   Refer to the procedure report that was given to you for any specific questions about what was found during the examination.  If the procedure report does not answer your questions, please call your gastroenterologist to clarify.  If you requested that your care partner not be given the details of your procedure findings, then the procedure report has been included in a sealed envelope for you to review at your convenience later.  YOU SHOULD EXPECT: Some feelings of bloating in the abdomen. Passage of more gas than usual.  Walking can help get rid of the air that was put into your GI tract during the procedure and reduce the bloating. If you had a lower endoscopy (such as a colonoscopy or flexible sigmoidoscopy) you may notice spotting of blood in your stool or on the toilet paper. If you underwent a bowel prep for your procedure, you may not have a normal bowel movement for a few days.  Please Note:  You might notice some irritation and congestion in your nose or some drainage.  This is from the oxygen used during your procedure.  There is no need for concern and it should clear up in a day or so.  SYMPTOMS TO REPORT IMMEDIATELY:    Following upper endoscopy (EGD)  Vomiting of blood or coffee ground material  New chest pain or pain under the shoulder blades  Painful or persistently difficult swallowing  New shortness of breath  Fever of 100F or higher  Black, tarry-looking stools  For urgent or emergent issues, a gastroenterologist can be reached at any hour by calling [phone]. Do not use MyChart messaging for urgent concerns.    DIET:  We do recommend a small meal at first, but then you may proceed to your regular diet.  Drink plenty of fluids but you should avoid alcoholic beverages for 24 hours.  ACTIVITY:  You should plan to take it easy for the rest of today and you should NOT DRIVE or use heavy machinery  until tomorrow (because of the sedation medicines used during the test).    FOLLOW UP: Our staff will call the number listed on your records 48-72 hours following your procedure to check on you and address any questions or concerns that you may have regarding the information given to you following your procedure. If we do not reach you, we will leave a message.  We will attempt to reach you two times.  During this call, we will ask if you have developed any symptoms of COVID 19. If you develop any symptoms (ie: fever, flu-like symptoms, shortness of breath, cough etc.) before then, please call [phone].  If you test positive for Covid 19 in the 2 weeks post procedure, please call and report this information to us.    If any biopsies were taken you will be contacted by phone or by letter within the next 1-3 weeks.  Please call us at [phone] if you have not heard about the biopsies in 3 weeks.    SIGNATURES/CONFIDENTIALITY: You and/or your care partner have signed paperwork which will be entered into your electronic medical record.  These signatures attest to the fact that that the information above on your After Visit Summary has been reviewed and is understood.  Full responsibility of the confidentiality of this discharge information lies with you and/or your care-partner. 

## 2021-03-25 NOTE — Anesthesia Preprocedure Evaluation (Signed)
Anesthesia Evaluation  Patient identified by MRN, date of birth, ID band Patient awake    Reviewed: Allergy & Precautions, NPO status , Patient's Chart, lab work & pertinent test results  Airway Mallampati: II  TM Distance: >3 FB Neck ROM: Limited    Dental no notable dental hx.    Pulmonary COPD, former smoker,    Pulmonary exam normal breath sounds clear to auscultation       Cardiovascular + CAD and + Past MI  Normal cardiovascular exam Rhythm:Regular Rate:Normal     Neuro/Psych negative neurological ROS  negative psych ROS   GI/Hepatic Neg liver ROS, GERD  ,  Endo/Other  negative endocrine ROS  Renal/GU negative Renal ROS  negative genitourinary   Musculoskeletal negative musculoskeletal ROS (+)   Abdominal   Peds negative pediatric ROS (+)  Hematology negative hematology ROS (+)   Anesthesia Other Findings   Reproductive/Obstetrics negative OB ROS                             Anesthesia Physical Anesthesia Plan  ASA: III  Anesthesia Plan: MAC   Post-op Pain Management:    Induction: Intravenous  PONV Risk Score and Plan: 2 and Propofol infusion  Airway Management Planned: Simple Face Mask  Additional Equipment:   Intra-op Plan:   Post-operative Plan:   Informed Consent: I have reviewed the patients History and Physical, chart, labs and discussed the procedure including the risks, benefits and alternatives for the proposed anesthesia with the patient or authorized representative who has indicated his/her understanding and acceptance.     Dental advisory given  Plan Discussed with: CRNA and Surgeon  Anesthesia Plan Comments:         Anesthesia Quick Evaluation

## 2021-03-26 ENCOUNTER — Encounter (HOSPITAL_COMMUNITY): Payer: Self-pay | Admitting: Gastroenterology

## 2021-03-26 ENCOUNTER — Other Ambulatory Visit: Payer: Medicare Other

## 2021-03-26 LAB — SURGICAL PATHOLOGY

## 2021-03-30 NOTE — Progress Notes (Signed)
Chronic Care Management Pharmacy Note  04/05/2021 Name:  Taylor Burnett MRN:  [id number] DOB:  [birth year]  Subjective: Taylor Burnett is an 70 y.o. year old female who is a primary patient of Pickard, Cammie Mcgee, MD.  The CCM team was consulted for assistance with disease management and care coordination needs.    Engaged with patient by telephone for follow up visit in response to provider referral for pharmacy case management and/or care coordination services.   Consent to Services:  The patient was given the following information about Chronic Care Management services today, agreed to services, and gave verbal consent: 1. CCM service includes personalized support from designated clinical staff supervised by the primary care provider, including individualized plan of care and coordination with other care providers 2. 24/7 contact phone numbers for assistance for urgent and routine care needs. 3. Service will only be billed when office clinical staff spend 20 minutes or more in a month to coordinate care. 4. Only one practitioner may furnish and bill the service in a calendar month. 5.The patient may stop CCM services at any time (effective at the end of the month) by phone call to the office staff. 6. The patient will be responsible for cost sharing (co-pay) of up to 20% of the service fee (after annual deductible is met). Patient agreed to services and consent obtained.  Patient Care Team: Susy Frizzle, MD as PCP - General (Family Medicine) Sueanne Margarita, MD as PCP - Cardiology (Cardiology) Edythe Clarity, [facility] (Pharmacist) Edythe Clarity, [facility] (Pharmacist)  Recent office visits: 02/11/21 Cardio Sueanne Margarita, MD. Labs drawn. STARTED Diltiazem HCL Coated Beads 180 mg daily.  Recent consult visits: 03/05/21 ([place]) - no changes to medication.  Continues to rotate ABX and use inhalers as previous.  02/10/21 Gastro [place], Amy S, PA-C. Per note:The doctor wanted the patient  to start antireflux diet and antireflux regimen to include n.p.o. for 2 to 3 h prior to bedtime and elevation of the back 45 degrees while sleeping.STARTED Pantoprazole Sodium 40 mg daily. STOPPED Esomeprazole Magnesium. Hospital visits: None in previous 6 months  Objective:  Lab Results  Component Value Date   CREATININE 0.87 12/10/2020   BUN 22 12/10/2020   GFRNONAA 69 12/10/2020   GFRAA 79 12/10/2020   NA 141 12/10/2020   K 4.4 12/10/2020   CALCIUM 9.9 12/10/2020   CO2 28 12/10/2020   GLUCOSE 109 (H) 12/10/2020    Lab Results  Component Value Date/Time   HGBA1C 5.5 04/20/2018 03:35 PM   HGBA1C 5.2 06/19/2017 02:08 PM   HGBA1C 5.4 01/23/2017 02:02 PM   HGBA1C 5.8 (H) 06/06/2016 11:50 AM    Last diabetic Eye exam: No results found for: HMDIABEYEEXA  Last diabetic Foot exam: No results found for: HMDIABFOOTEX   Lab Results  Component Value Date   CHOL 180 02/11/2021   HDL 73 02/11/2021   LDLCALC 94 02/11/2021   TRIG 69 02/11/2021   CHOLHDL 2.5 02/11/2021    Hepatic Function Latest Ref Rng & Units 02/11/2021 10/08/2020 12/11/2018  Total Protein 6.1 - 8.1 g/dL - 6.7 7.0  Albumin 3.6 - 5.1 g/dL - - -  AST 10 - 35 U/L - 16 19  ALT 0 - 32 IU/L _0 Alk Phosphatase 33 - 130 U/L - - -  Total Bilirubin 0.2 - 1.2 mg/dL - 0.4 0.3    Lab Results  Component Value Date/Time   TSH 1.25 12/11/2018 12:10 PM  TSH 1.34 06/19/2017 01:58 PM    CBC Latest Ref Rng & Units 10/08/2020 04/29/2019 12/11/2018  WBC 3.8 - 10.8 Thousand/uL 7.5 6.7 7.1  Hemoglobin 11.7 - 15.5 g/dL 14.5 13.0 14.7  Hematocrit 35.0 - 45.0 % 43.3 40.7 45.0  Platelets 140 - 400 Thousand/uL 283 264 292    No results found for: VD25OH  Clinical ASCVD: Yes  The 10-year ASCVD risk score Mikey Bussing DC Jr., et al., 2013) is: 5%   Values used to calculate the score:     Age: 57 years     Sex: Female     Is Non-Hispanic African American: No     Diabetic: No     Tobacco smoker: No     Systolic Blood Pressure:  109 mmHg     Is BP treated: No     HDL Cholesterol: 73 mg/dL     Total Cholesterol: 180 mg/dL    Depression screen [facility] 2/9 06/19/2017 01/23/2017 07/21/2016  Decreased Interest 0 0 0  Down, Depressed, Hopeless 0 0 0  PHQ - 2 Score 0 0 0      Social History   Tobacco Use  Smoking Status Former Smoker  . Packs/day: 2.00  . Years: 40.00  . Pack years: 80.00  . Types: Cigarettes  . Quit date: 07/31/2013  . Years since quitting: 7.6  Smokeless Tobacco Never Used   BP Readings from Last 3 Encounters:  03/25/21 109/65  03/05/21 108/64  02/11/21 120/78   Pulse Readings from Last 3 Encounters:  03/25/21 80  03/05/21 89  02/11/21 88   Wt Readings from Last 3 Encounters:  03/25/21 146 lb (66.2 kg)  03/05/21 144 lb 9.6 oz (65.6 kg)  02/11/21 146 lb (66.2 kg)   BMI Readings from Last 3 Encounters:  03/25/21 27.59 kg/m  03/05/21 26.45 kg/m  02/11/21 27.59 kg/m    Assessment/Interventions: Review of patient past medical history, allergies, medications, health status, including review of consultants reports, laboratory and other test data, was performed as part of comprehensive evaluation and provision of chronic care management services.   SDOH:  (Social Determinants of Health) assessments and interventions performed: Yes   Financial Resource Strain: Low Risk   . Difficulty of Paying Living Expenses: Not very hard    SDOH Screenings   Alcohol Screen: Not on file  Depression (GGE3-6): Not on file  Financial Resource Strain: Low Risk   . Difficulty of Paying Living Expenses: Not very hard  Food Insecurity: Not on file  Housing: Not on file  Physical Activity: Not on file  Social Connections: Not on file  Stress: Not on file  Tobacco Use: Medium Risk  . Smoking Tobacco Use: Former Smoker  . Smokeless Tobacco Use: Never Used  Transportation Needs: Not on file    CCM Care Plan  Allergies  Allergen Reactions  . Prednisone Other (See Comments)    Depression and  suicidal  . Toprol Xl [Metoprolol]     Fatigue    Medications Reviewed Today    Reviewed by Edythe Clarity, [facility] (Pharmacist) on 04/05/21 at 1212  Med List Status: <None>  Medication Order Taking? Sig Documenting Provider Last Dose Status Informant  albuterol (PROAIR HFA) 108 (90 Base) MCG/ACT inhaler [id number] Yes INHALE 2 PUFFS INTO THE LUNGS EVERY 6 HOURS AS NEEDED.  Patient taking differently: Inhale 2 puffs into the lungs every 6 (six) hours as needed for wheezing or shortness of breath. INHALE 2 PUFFS INTO THE LUNGS EVERY 6 HOURS AS NEEDED.  Collene Gobble, MD Taking Active   aspirin EC 81 MG tablet [id number] Yes Take 1 tablet (81 mg total) by mouth daily. Swallow whole. Sueanne Margarita, MD Taking Active Self  atorvastatin (LIPITOR) 20 MG tablet [id number] Yes Take 1 tablet (20 mg total) by mouth daily. Sueanne Margarita, MD Taking Active Self  azithromycin (ZITHROMAX) 250 MG tablet [id number] Yes Take 250 mg by mouth as directed. [provider] Taking Active Self           Med Note Wilmon Pali, MELISSA R   Thu Mar 11, 2021 12:20 PM) ON HOLD   BROVANA 15 MCG/2ML NEBU [id number] Yes USE 1 VIAL  IN  NEBULIZER TWICE  DAILY - morning and evening  Patient taking differently: Take 15 mcg by nebulization daily.   Collene Gobble, MD Taking Active   Budeson-Glycopyrrol-Formoterol (BREZTRI AEROSPHERE) 160-9-4.8 MCG/ACT Hollie Salk [id number] Yes Inhale 2 puffs into the lungs in the morning and at bedtime. Collene Gobble, MD Taking Active Self  cefUROXime (CEFTIN) 250 MG tablet [id number] Yes Take 1 tablet (250 mg total) by mouth 2 (two) times daily with a meal. Collene Gobble, MD Taking Active Self  cetirizine (ZYRTEC) 10 MG tablet [id number] Yes Take 10 mg by mouth at bedtime.  [provider] Taking Active Self  Cholecalciferol (VITAMIN D) 2000 units CAPS [id number] Yes Take 2,000 Units by mouth daily.  [provider] Taking Active Self  denosumab (PROLIA) injection 60 mg  [id number]    Patient taking differently: Inject 60 mg into the skin every 6 (six) months. Please verify that patient has been taking calcium and vitamin D supplement and CMET within the last 12 months. Do NOT give if calcium low; contact MD and/or pharmacist to evaluate. Provide patient medication guide if has not received previously.   Susy Frizzle, MD  Active   diltiazem (CARDIZEM CD) 180 MG 24 hr capsule [id number] Yes Take 1 capsule (180 mg total) by mouth daily. Sueanne Margarita, MD Taking Active Self  doxycycline (VIBRA-TABS) 100 MG tablet [id number] Yes Take 100 mg by mouth 2 (two) times daily. [provider] Taking Active Self           Med Note Wilmon Pali, MELISSA R   Thu Mar 11, 2021 12:16 PM) ON HOLD   fluticasone (FLONASE) 50 MCG/ACT nasal spray [id number] Yes Place 2 sprays into both nostrils every evening. Collene Gobble, MD Taking Active Self  gabapentin (NEURONTIN) 100 MG capsule [id number] Yes Take 1 capsule (100 mg total) by mouth 3 (three) times daily.  Patient taking differently: Take 100 mg by mouth at bedtime.   Susy Frizzle, MD Taking Active   ipratropium (ATROVENT) 0.02 % nebulizer solution [id number] Yes USE 1 VIAL IN NEBULIZER 3 TIMES DAILY  Patient taking differently: Take 0.5 mg by nebulization daily.   Collene Gobble, MD Taking Active   Melatonin 10 MG TABS [id number] Yes Take 10 mg by mouth at bedtime as needed (sleep). [provider] Taking Active Self  naproxen sodium (ALEVE) 220 MG tablet [id number] Yes Take 440 mg by mouth 2 (two) times daily as needed (pain).  [provider] Taking Active Self  OXYGEN [id number] Yes Inhale 2 L into the lungs as needed. FOR USE WITH EXERTION [provider] Taking Active Self  pantoprazole (PROTONIX) 40 MG tablet [id number] Yes Take 1 tablet (40 mg total) by mouth daily. Alfredia Ferguson, PA-C Taking Active Self  Patient Active Problem List   Diagnosis Date Noted  . PAT  (paroxysmal atrial tachycardia) ([place])   . CAD (coronary artery disease), native coronary artery   . Mycobacterium gordonae infection 03/11/2019  . Chronic cough 10/22/2018  . GERD (gastroesophageal reflux disease) 07/20/2018  . Anxiety 06/21/2017  . Prediabetes 06/21/2017  . Chronic respiratory failure ([place]) 04/18/2017  . Other insomnia 01/23/2017  . COPD (chronic obstructive pulmonary disease) with emphysema ([place]) 01/16/2017  . Sleep related hypoxia 01/16/2017  . Sleep apnea in adult 07/11/2016  . Chronic rhinitis 01/05/2016  . S/P cervical spinal fusion 03/13/2015  . Hemoptysis 01/01/2014  . Atypical chest pain 06/08/2012  . Neutropenia 12/23/2011  . Thrombocytopenia ([place]) 12/22/2011  . Hypoxemia 12/19/2011    Immunization History  Administered Date(s) Administered  . PFIZER Comirnaty(Gray Top)Covid-19 Tri-Sucrose Vaccine 01/28/2021  . PFIZER(Purple Top)SARS-COV-2 Vaccination 01/07/2021  . Pneumococcal Conjugate-13 07/30/2019  . Pneumococcal Polysaccharide-23 04/20/2018  . Tdap 06/06/2016    Conditions to be addressed/monitored:  COPD, GERD, Prediabetes, anxiety,   Care Plan : General Pharmacy (Adult)  Updates made by Edythe Clarity, RPH since 04/05/2021 12:00 AM    Problem: Tachycardia, GERD, COPD   Priority: High  Onset Date: 04/05/2021    Long-Range Goal: Patient-Specific Goal   Start Date: 04/05/2021  Expected End Date: 10/05/2021  This Visit's Progress: On track  Priority: High  Note:   Current Barriers:  . Unable to independently monitor therapeutic efficacy . No home BP logs available  Pharmacist Clinical Goal(s):  Marland Kitchen Patient will achieve adherence to monitoring guidelines and medication adherence to achieve therapeutic efficacy . maintain control of blood sugar as evidenced by A1c update  . contact provider office for questions/concerns as evidenced notation of same in electronic health record through collaboration with PharmD and provider.    Interventions: . 1:1 collaboration with Susy Frizzle, MD regarding development and update of comprehensive plan of care as evidenced by provider attestation and co-signature . Inter-disciplinary care team collaboration (see longitudinal plan of care) . Comprehensive medication review performed; medication list updated in electronic medical record  COPD (Goal: control symptoms and prevent exacerbations) -Controlled -Current treatment  . Breztri 160-9-4.5mg/act . Proair HFA 90 mcg . Atrovent 0.02% nebs prn -Medications previously tried: Trelegy, Anoro -Gold Grade: Gold 4 (FEV1<40%) -Current COPD Classification:  D (high sx, >/=2 exacerbations/yr)  -Exacerbations requiring treatment in last 6 months: none -Patient reports consistent use of maintenance inhaler -Frequency of rescue inhaler use: prn (usually once daily) -Counseled on Proper inhaler technique; Benefits of consistent maintenance inhaler use Differences between maintenance and rescue inhalers  -She reports SOB has been constant -Recently had endoscopy where fungus was found - she was treated with Diflucan -Recommended to continue current medication  Atrial tachycardia (Goal: Control HR) -Controlled -Current treatment  . Diltiazem CD 1839m24 hr capsule daily -Medications previously tried: Toprol XL (fatigue) -Reports she was recently started on Cardizem after cardiology visit -Denies adverse effects as of now -Not checking blood pressure, recommended patient check at least once or twice weekly  -Recommended to continue current medication  -Recommend home BP monitoring   GERD (Goal: Control symptoms) -Not ideally controlled -Current treatment   Pantoprazole 4033maily -Medications previously tried: Esomeprazole -Mainly only eating salads at this time, seems like everything else causes her to have symptoms  -Recommended to continue current medication Recommended adding lean proteins to her salads to ensure  adequate protein intakes  Hyperlipidemia/CAD: (LDL goal < 70) -Not ideally controlled -Current treatment: .  Atorvastatin 16m daily -Medications previously tried: none noted  -Current dietary patterns: salads mainly -Educated on Cholesterol goals;  Benefits of statin for ASCVD risk reduction; Importance of limiting foods high in cholesterol; -Recommended to continue current medication    Patient Goals/Self-Care Activities . Patient will:  - take medications as prescribed focus on medication adherence by fill dates and pill box check blood pressure periodically, document, and provide at future appointments  Follow Up Plan: The care management team will reach out to the patient again over the next 90 days.         Medication Assistance: None required.  Patient affirms current coverage meets needs.  Patient's [facility] pharmacy is:  [facility] [address] [address]Phone: [phone]: [phone] [place] [address] [address]Phone: [phone]: [phone] Uses pill box? Yes Pt endorses 100% compliance  We discussed: Benefits of medication synchronization, packaging and delivery as well as enhanced pharmacist oversight with [facility]. Patient decided to: Utilize [facility] pharmacy for medication synchronization, packaging and delivery  Care Plan and Follow Up Patient Decision:  Patient agrees to Care Plan and Follow-up.  Plan: The care management team will reach out to the patient again over the next 90 days.  CBeverly Milch PharmD Clinical Pharmacist [place]([phone]

## 2021-04-01 DIAGNOSIS — H26492 Other secondary cataract, left eye: Secondary | ICD-10-CM | POA: Diagnosis not present

## 2021-04-01 DIAGNOSIS — Z961 Presence of intraocular lens: Secondary | ICD-10-CM | POA: Diagnosis not present

## 2021-04-01 NOTE — H&P (Signed)
HPI: This is a woman with dyspepsia, dysphagai   ROS: complete GI ROS as described in HPI, all other review negative.  Constitutional:  No unintentional weight loss   Past Medical History:  Diagnosis Date  . Arthritis   . CAD (coronary artery disease), native coronary artery    mild non obstructive CAD of the RCA, LCX, LAD up to 25-49% by coronary CTA 11/2020  . Cataract   . COPD (chronic obstructive pulmonary disease) (HCC)   . Cough   . DDD (degenerative disc disease), cervical   . Dysrhythmia    RAPID HEART BEAT OCC   . Emphysema of lung (HCC)   . GERD (gastroesophageal reflux disease)    OCC  . H/O degenerative disc disease   . Headache   . Myocardial infarction (HCC) 2016  . Osteoporosis   . Panic attacks   . PAT (paroxysmal atrial tachycardia) (HCC)    noted on event monitor 11/2020  . Pre-diabetes   . Severe lung disease   . Shortness of breath dyspnea   . Sleep apnea   . Spondylosis     Past Surgical History:  Procedure Laterality Date  . ANTERIOR CERVICAL DECOMP/DISCECTOMY FUSION N/A 03/13/2015   Procedure: CERVICAL THREE-CERVICAL FOUR, CERVICAL FOUR-CERVICAL FIVE, CERVICAL FIVE-CERVICAL SIX ANTERIOR CERVICAL DECOMPRESSION/DISCECTOMY FUSION 3 LEVELS;  Surgeon: Tia Alert, MD;  Location: [facility];  Service: Neurosurgery;  Laterality: N/A;  . BIOPSY  03/25/2021   Procedure: BIOPSY;  Surgeon: Rachael Fee, MD;  Location: [facility];  Service: Endoscopy;;  . BREAST ENHANCEMENT SURGERY  1990  . CARDIAC CATHETERIZATION  1995  . CATARACT EXTRACTION W/PHACO Right 05/02/2017   Procedure: CATARACT EXTRACTION PHACO AND INTRAOCULAR LENS PLACEMENT (IOC);  Surgeon: Jethro Bolus, MD;  Location: [facility];  Service: Ophthalmology;  Laterality: Right;  CDE: 6.22  . CATARACT EXTRACTION W/PHACO Left 05/16/2017   Procedure: CATARACT EXTRACTION PHACO AND INTRAOCULAR LENS PLACEMENT (IOC);  Surgeon: Jethro Bolus, MD;  Location: [facility];  Service: Ophthalmology;  Laterality:  Left;  CDE: 4.10  . ESOPHAGOGASTRODUODENOSCOPY (EGD) WITH PROPOFOL N/A 03/25/2021   Procedure: ESOPHAGOGASTRODUODENOSCOPY (EGD) WITH PROPOFOL;  Surgeon: Rachael Fee, MD;  Location: [facility];  Service: Endoscopy;  Laterality: N/A;  . TONSILLECTOMY    . TUBAL LIGATION    . VIDEO BRONCHOSCOPY Bilateral 12/31/2018   Procedure: VIDEO BRONCHOSCOPY WITHOUT FLUORO;  Surgeon: Leslye Peer, MD;  Location: [facility];  Service: Cardiopulmonary;  Laterality: Bilateral;    Current Facility-Administered Medications  Medication Dose Route Frequency Provider Last Rate Last Admin  . denosumab (PROLIA) injection 60 mg  60 mg Subcutaneous Q6 months Donita Brooks, MD   60 mg at 02/13/19 1553   Current Outpatient Medications  Medication Sig Dispense Refill  . albuterol (PROAIR HFA) 108 (90 Base) MCG/ACT inhaler INHALE 2 PUFFS INTO THE LUNGS EVERY 6 HOURS AS NEEDED. (Patient taking differently: Inhale 2 puffs into the lungs every 6 (six) hours as needed for wheezing or shortness of breath. INHALE 2 PUFFS INTO THE LUNGS EVERY 6 HOURS AS NEEDED.) 18 g 5  . aspirin EC 81 MG tablet Take 1 tablet (81 mg total) by mouth daily. Swallow whole. 90 tablet 3  . atorvastatin (LIPITOR) 20 MG tablet Take 1 tablet (20 mg total) by mouth daily. 90 tablet 3  . BROVANA 15 MCG/2ML NEBU USE 1 VIAL  IN  NEBULIZER TWICE  DAILY - morning and evening (Patient taking differently: Take 15 mcg by nebulization daily.) 2 mL 9  .  Budeson-Glycopyrrol-Formoterol (BREZTRI AEROSPHERE) 160-9-4.8 MCG/ACT AERO Inhale 2 puffs into the lungs in the morning and at bedtime. 10.7 g 5  . cefUROXime (CEFTIN) 250 MG tablet Take 1 tablet (250 mg total) by mouth 2 (two) times daily with a meal. 14 tablet 2  . cetirizine (ZYRTEC) 10 MG tablet Take 10 mg by mouth at bedtime.     . Cholecalciferol (VITAMIN D) 2000 units CAPS Take 2,000 Units by mouth daily.     Marland Kitchen diltiazem (CARDIZEM CD) 180 MG 24 hr capsule Take 1 capsule (180 mg total) by mouth  daily. 90 capsule 3  . fluconazole (DIFLUCAN) 50 MG tablet Take 2 tablets (100 mg total) by mouth daily for 10 days. 20 tablet 0  . fluticasone (FLONASE) 50 MCG/ACT nasal spray Place 2 sprays into both nostrils every evening. 16 mL 12  . gabapentin (NEURONTIN) 100 MG capsule Take 1 capsule (100 mg total) by mouth 3 (three) times daily. (Patient taking differently: Take 100 mg by mouth at bedtime.) 90 capsule 0  . Melatonin 10 MG TABS Take 10 mg by mouth at bedtime as needed (sleep).    . naproxen sodium (ALEVE) 220 MG tablet Take 440 mg by mouth 2 (two) times daily as needed (pain).     . OXYGEN Inhale 2 L into the lungs as needed. FOR USE WITH EXERTION    . pantoprazole (PROTONIX) 40 MG tablet Take 1 tablet (40 mg total) by mouth daily. 30 tablet 11  . azithromycin (ZITHROMAX) 250 MG tablet Take 250 mg by mouth as directed.    . doxycycline (VIBRA-TABS) 100 MG tablet Take 100 mg by mouth 2 (two) times daily.    Marland Kitchen ipratropium (ATROVENT) 0.02 % nebulizer solution USE 1 VIAL IN NEBULIZER 3 TIMES DAILY (Patient taking differently: Take 0.5 mg by nebulization daily.) 90 mL 9    Allergies as of 02/11/2021 - Review Complete 02/11/2021  Allergen Reaction Noted  . Prednisone Other (See Comments) 09/24/2011  . Toprol xl [metoprolol]  02/11/2021    Family History  Problem Relation Age of Onset  . Emphysema Father   . Allergies Mother   . Allergies Son   . Allergies Brother   . Heart disease Maternal Grandmother   . Rheum arthritis Brother   . Cancer Other        aunts and uncles    Social History   Socioeconomic History  . Marital status: Widowed    Spouse name: Not on file  . Number of children: Not on file  . Years of education: Not on file  . Highest education level: Not on file  Occupational History  . Occupation: on disability.  Tobacco Use  . Smoking status: Former Smoker    Packs/day: 2.00    Years: 40.00    Pack years: 80.00    Types: Cigarettes    Quit date: 07/31/2013     Years since quitting: 7.6  . Smokeless tobacco: Never Used  Vaping Use  . Vaping Use: Never used  Substance and Sexual Activity  . Alcohol use: No    Comment: OCC WINE   . Drug use: No  . Sexual activity: Not on file  Other Topics Concern  . Not on file  Social History Narrative   Divorced and lives alone.    Social Determinants of Health   Financial Resource Strain: Low Risk   . Difficulty of Paying Living Expenses: Not very hard  Food Insecurity: Not on file  Transportation Needs: Not on file  Physical Activity: Not on file  Stress: Not on file  Social Connections: Not on file  Intimate Partner Violence: Not on file     Physical Exam: BP 109/65   Pulse 80   Temp 97.9 F (36.6 C) (Axillary)   Resp (!) 23   Ht 5\' 1"  (1.549 m)   Wt 66.2 kg   SpO2 97%   BMI 27.59 kg/m  Constitutional: generally well-appearing Psychiatric: alert and oriented x3 Abdomen: soft, nontender, nondistended, no obvious ascites, no peritoneal signs, normal bowel sounds No peripheral edema noted in lower extremities  Assessment and plan: 70 y.o. female with dyspepsia, dysphgai  For EGD  Please see the "Patient Instructions" section for addition details about the plan.  74, MD [facility] 04/01/2021, 2:09 PM

## 2021-04-05 ENCOUNTER — Ambulatory Visit (INDEPENDENT_AMBULATORY_CARE_PROVIDER_SITE_OTHER): Payer: Medicare Other | Admitting: Pharmacist

## 2021-04-05 DIAGNOSIS — J438 Other emphysema: Secondary | ICD-10-CM | POA: Diagnosis not present

## 2021-04-05 DIAGNOSIS — K219 Gastro-esophageal reflux disease without esophagitis: Secondary | ICD-10-CM

## 2021-04-05 DIAGNOSIS — I471 Supraventricular tachycardia: Secondary | ICD-10-CM

## 2021-04-05 NOTE — Patient Instructions (Addendum)
Visit Information  Goals Addressed            This Visit's Progress   . Track and Manage Heart Rate and Rhythm-Atrial Fibrillation       Timeframe:  Long-Range Goal Priority:  High Start Date:  04/05/21                           Expected End Date:     10/25/21                  Follow Up Date 06/31/22   - check pulse (heart) rate before taking medicine - check pulse (heart) rate once a day - make a plan to eat healthy - take medicine as prescribed    Why is this important?    Atrial fibrillation may have no symptoms. Sometimes the symptoms get worse or happen more often.   It is important to keep track of what your symptoms are and when they happen.   A change in symptoms is important to discuss with your doctor or nurse.   Being active and healthy eating will also help you manage your heart condition.     Notes:       Patient Care Plan: General Pharmacy (Adult)    Problem Identified: Tachycardia, GERD, COPD   Priority: High  Onset Date: 04/05/2021    Long-Range Goal: Patient-Specific Goal   Start Date: 04/05/2021  Expected End Date: 10/05/2021  This Visit's Progress: On track  Priority: High  Note:   Current Barriers:  . Unable to independently monitor therapeutic efficacy . No home BP logs available  Pharmacist Clinical Goal(s):  Marland Kitchen Patient will achieve adherence to monitoring guidelines and medication adherence to achieve therapeutic efficacy . maintain control of blood sugar as evidenced by A1c update  . contact provider office for questions/concerns as evidenced notation of same in electronic health record through collaboration with PharmD and provider.   Interventions: . 1:1 collaboration with Donita Brooks, MD regarding development and update of comprehensive plan of care as evidenced by provider attestation and co-signature . Inter-disciplinary care team collaboration (see longitudinal plan of care) . Comprehensive medication review performed;  medication list updated in electronic medical record  COPD (Goal: control symptoms and prevent exacerbations) -Controlled -Current treatment  . Breztri 160-9-4.5mcg/act . Proair HFA 90 mcg . Atrovent 0.02% nebs prn -Medications previously tried: Trelegy, Anoro -Gold Grade: Gold 4 (FEV1<40%) -Current COPD Classification:  D (high sx, >/=2 exacerbations/yr)  -Exacerbations requiring treatment in last 6 months: none -Patient reports consistent use of maintenance inhaler -Frequency of rescue inhaler use: prn (usually once daily) -Counseled on Proper inhaler technique; Benefits of consistent maintenance inhaler use Differences between maintenance and rescue inhalers  -She reports SOB has been constant -Recently had endoscopy where fungus was found - she was treated with Diflucan -Recommended to continue current medication  Atrial tachycardia (Goal: Control HR) -Controlled -Current treatment  . Diltiazem CD 180mg  24 hr capsule daily -Medications previously tried: Toprol XL (fatigue) -Reports she was recently started on Cardizem after cardiology visit -Denies adverse effects as of now -Not checking blood pressure, recommended patient check at least once or twice weekly  -Recommended to continue current medication  -Recommend home BP monitoring   GERD (Goal: Control symptoms) -Not ideally controlled -Current treatment   Pantoprazole 40mg  daily -Medications previously tried: Esomeprazole -Mainly only eating salads at this time, seems like everything else causes her to have symptoms  -Recommended to  continue current medication Recommended adding lean proteins to her salads to ensure adequate protein intakes  Hyperlipidemia/CAD: (LDL goal < 70) -Not ideally controlled -Current treatment: . Atorvastatin 20mg  daily -Medications previously tried: none noted  -Current dietary patterns: salads mainly -Educated on Cholesterol goals;  Benefits of statin for ASCVD risk  reduction; Importance of limiting foods high in cholesterol; -Recommended to continue current medication    Patient Goals/Self-Care Activities . Patient will:  - take medications as prescribed focus on medication adherence by fill dates and pill box check blood pressure periodically, document, and provide at future appointments  Follow Up Plan: The care management team will reach out to the patient again over the next 90 days.         The patient verbalized understanding of instructions, educational materials, and care plan provided today and agreed to receive a mailed copy of patient instructions, educational materials, and care plan.  Telephone follow up appointment with pharmacy team member scheduled for: 6 months  , [facility]  Gastroesophageal Reflux Disease, Adult  Gastroesophageal reflux (GER) happens when acid from the stomach flows up into the tube that connects the mouth and the stomach (esophagus). Normally, food travels down the esophagus and stays in the stomach to be digested. With GER, food and stomach acid sometimes move back up into the esophagus. You may have a disease called gastroesophageal reflux disease (GERD) if the reflux:  Happens often.  Causes frequent or very bad symptoms.  Causes problems such as damage to the esophagus. When this happens, the esophagus becomes sore and swollen. Over time, GERD can make small holes (ulcers) in the lining of the esophagus. What are the causes? This condition is caused by a problem with the muscle between the esophagus and the stomach. When this muscle is weak or not normal, it does not close properly to keep food and acid from coming back up from the stomach. The muscle can be weak because of:  Tobacco use.  Pregnancy.  Having a certain type of hernia (hiatal hernia).  Alcohol use.  Certain foods and drinks, such as coffee, chocolate, onions, and peppermint. What increases the risk?  Being  overweight.  Having a disease that affects your connective tissue.  Taking NSAIDs, such a ibuprofen. What are the signs or symptoms?  Heartburn.  Difficult or painful swallowing.  The feeling of having a lump in the throat.  A bitter taste in the mouth.  Bad breath.  Having a lot of saliva.  Having an upset or bloated stomach.  Burping.  Chest pain. Different conditions can cause chest pain. Make sure you see your doctor if you have chest pain.  Shortness of breath or wheezing.  A long-term cough or a cough at night.  Wearing away of the surface of teeth (tooth enamel).  Weight loss. How is this treated?  Making changes to your diet.  Taking medicine.  Having surgery. Treatment will depend on how bad your symptoms are. Follow these instructions at home: Eating and drinking  Follow a diet as told by your doctor. You may need to avoid foods and drinks such as: ? Coffee and tea, with or without caffeine. ? Drinks that contain alcohol. ? Energy drinks and sports drinks. ? Bubbly (carbonated) drinks or sodas. ? Chocolate and cocoa. ? Peppermint and mint flavorings. ? Garlic and onions. ? Horseradish. ? Spicy and acidic foods. These include peppers, chili powder, curry powder, vinegar, hot sauces, and BBQ sauce. ? Citrus fruit juices and  citrus fruits, such as oranges, lemons, and limes. ? Tomato-based foods. These include red sauce, chili, salsa, and pizza with red sauce. ? Fried and fatty foods. These include donuts, french fries, potato chips, and high-fat dressings. ? High-fat meats. These include hot dogs, rib eye steak, sausage, ham, and bacon. ? High-fat dairy items, such as whole milk, butter, and cream cheese.  Eat small meals often. Avoid eating large meals.  Avoid drinking large amounts of liquid with your meals.  Avoid eating meals during the 2-3 hours before bedtime.  Avoid lying down right after you eat.  Do not exercise right after you eat.    Lifestyle  Do not smoke or use any products that contain nicotine or tobacco. If you need help quitting, ask your doctor.  Try to lower your stress. If you need help doing this, ask your doctor.  If you are overweight, lose an amount of weight that is healthy for you. Ask your doctor about a safe weight loss goal.   General instructions  Pay attention to any changes in your symptoms.  Take over-the-counter and prescription medicines only as told by your doctor.  Do not take aspirin, ibuprofen, or other NSAIDs unless your doctor says it is okay.  Wear loose clothes. Do not wear anything tight around your waist.  Raise (elevate) the head of your bed about 6 inches (15 cm). You may need to use a wedge to do this.  Avoid bending over if this makes your symptoms worse.  Keep all follow-up visits. Contact a doctor if:  You have new symptoms.  You lose weight and you do not know why.  You have trouble swallowing or it hurts to swallow.  You have wheezing or a cough that keeps happening.  You have a hoarse voice.  Your symptoms do not get better with treatment. Get help right away if:  You have sudden pain in your arms, neck, jaw, teeth, or back.  You suddenly feel sweaty, dizzy, or light-headed.  You have chest pain or shortness of breath.  You vomit and the vomit is green, yellow, or black, or it looks like blood or coffee grounds.  You faint.  Your poop (stool) is red, bloody, or black.  You cannot swallow, drink, or eat. These symptoms may represent a serious problem that is an emergency. Do not wait to see if the symptoms will go away. Get medical help right away. Call your local emergency services (911 in the [place] Do not drive yourself to the hospital. Summary  If a person has gastroesophageal reflux disease (GERD), food and stomach acid move back up into the esophagus and cause symptoms or problems such as damage to the esophagus.  Treatment will depend on  how bad your symptoms are.  Follow a diet as told by your doctor.  Take all medicines only as told by your doctor. This information is not intended to replace advice given to you by your health care provider. Make sure you discuss any questions you have with your health care provider. Document Revised: 06/22/2020 Document Reviewed: 06/22/2020 Elsevier Patient Education  2021 [employer].

## 2021-04-15 ENCOUNTER — Other Ambulatory Visit: Payer: Self-pay | Admitting: Emergency Medicine

## 2021-04-15 MED ORDER — BREZTRI AEROSPHERE 160-9-4.8 MCG/ACT IN AERO: 2.0000 | INHALATION_SPRAY | Freq: Two times a day (BID) | RESPIRATORY_TRACT | 5 refills | Status: DC

## 2021-04-19 ENCOUNTER — Other Ambulatory Visit: Payer: Medicare Other

## 2021-04-21 ENCOUNTER — Ambulatory Visit: Payer: Medicare Other | Admitting: Physician Assistant

## 2021-04-21 DIAGNOSIS — H26492 Other secondary cataract, left eye: Secondary | ICD-10-CM | POA: Diagnosis not present

## 2021-04-29 ENCOUNTER — Telehealth: Payer: Self-pay | Admitting: Pharmacist

## 2021-04-29 NOTE — Progress Notes (Addendum)
Chronic Care Management Pharmacy Assistant   Name: Taylor Burnett  MRN: [id number] DOB: [birth year]  Reason for Encounter: Disease State For HTN.   Conditions to be addressed/monitored: COPD, GERD, Prediabetes, anxiety.  Recent office visits:  None since 04/05/21   Recent consult visits:  None since 04/05/21  Hospital visits:  None since 04/05/21  Medications: Outpatient Encounter Medications as of 04/29/2021  Medication Sig Note   albuterol (PROAIR HFA) 108 (90 Base) MCG/ACT inhaler INHALE 2 PUFFS INTO THE LUNGS EVERY 6 HOURS AS NEEDED. (Patient taking differently: Inhale 2 puffs into the lungs every 6 (six) hours as needed for wheezing or shortness of breath. INHALE 2 PUFFS INTO THE LUNGS EVERY 6 HOURS AS NEEDED.)    aspirin EC 81 MG tablet Take 1 tablet (81 mg total) by mouth daily. Swallow whole.    atorvastatin (LIPITOR) 20 MG tablet Take 1 tablet (20 mg total) by mouth daily.    azithromycin (ZITHROMAX) 250 MG tablet Take 250 mg by mouth as directed. 03/11/2021: ON HOLD    BROVANA 15 MCG/2ML NEBU USE 1 VIAL  IN  NEBULIZER TWICE  DAILY - morning and evening (Patient taking differently: Take 15 mcg by nebulization daily.)    Budeson-Glycopyrrol-Formoterol (BREZTRI AEROSPHERE) 160-9-4.8 MCG/ACT AERO Inhale 2 puffs into the lungs in the morning and at bedtime.    cefUROXime (CEFTIN) 250 MG tablet Take 1 tablet (250 mg total) by mouth 2 (two) times daily with a meal.    cetirizine (ZYRTEC) 10 MG tablet Take 10 mg by mouth at bedtime.     Cholecalciferol (VITAMIN D) 2000 units CAPS Take 2,000 Units by mouth daily.     diltiazem (CARDIZEM CD) 180 MG 24 hr capsule Take 1 capsule (180 mg total) by mouth daily.    doxycycline (VIBRA-TABS) 100 MG tablet Take 100 mg by mouth 2 (two) times daily. 03/11/2021: ON HOLD    fluticasone (FLONASE) 50 MCG/ACT nasal spray Place 2 sprays into both nostrils every evening.    gabapentin (NEURONTIN) 100 MG capsule Take 1 capsule (100 mg total) by mouth 3  (three) times daily. (Patient taking differently: Take 100 mg by mouth at bedtime.)    ipratropium (ATROVENT) 0.02 % nebulizer solution USE 1 VIAL IN NEBULIZER 3 TIMES DAILY (Patient taking differently: Take 0.5 mg by nebulization daily.)    Melatonin 10 MG TABS Take 10 mg by mouth at bedtime as needed (sleep).    naproxen sodium (ALEVE) 220 MG tablet Take 440 mg by mouth 2 (two) times daily as needed (pain).     OXYGEN Inhale 2 L into the lungs as needed. FOR USE WITH EXERTION    pantoprazole (PROTONIX) 40 MG tablet Take 1 tablet (40 mg total) by mouth daily.    Facility-Administered Encounter Medications as of 04/29/2021  Medication   denosumab (PROLIA) injection 60 mg    Reviewed chart prior to disease state call. Spoke with patient regarding BP  Recent Office Vitals: BP Readings from Last 3 Encounters:  03/25/21 109/65  03/05/21 108/64  02/11/21 120/78   Pulse Readings from Last 3 Encounters:  03/25/21 80  03/05/21 89  02/11/21 88    Wt Readings from Last 3 Encounters:  03/25/21 146 lb (66.2 kg)  03/05/21 144 lb 9.6 oz (65.6 kg)  02/11/21 146 lb (66.2 kg)     Kidney Function Lab Results  Component Value Date/Time   CREATININE 0.87 12/10/2020 03:17 PM   CREATININE 0.87 10/08/2020 03:14 PM   CREATININE 0.93 04/29/2019  04:45 PM   CREATININE 0.82 12/27/2018 02:54 PM   GFRNONAA 69 12/10/2020 03:17 PM   GFRNONAA 68 10/08/2020 03:14 PM   GFRAA 79 12/10/2020 03:17 PM   GFRAA 79 10/08/2020 03:14 PM    BMP Latest Ref Rng & Units 12/10/2020 10/08/2020 04/29/2019  Glucose 65 - 99 mg/dL 094(B) 85 096(G)  BUN 8 - 27 mg/dL 22 16 15   Creatinine 0.57 - 1.00 mg/dL 8.36 6.29  BUN/Creat Ratio 12 - 28 25 NOT APPLICABLE -  Sodium 134 - 144 mmol/L 141 141 140  Potassium 3.5 - 5.2 mmol/L 4.4 4.1 4.0  Chloride 96 - 106 mmol/L 99 101 102  CO2 20 - 29 mmol/L 28 28 27   Calcium 8.7 - 10.3 mg/dL 9.9 9.5 9.6    Current antihypertensive regimen:  Diltiazem 180 mg daily   How often are  you checking your Blood Pressure? Patient stated she has a  blood pressure machine but is so busy she does not think about checking her blood pressure.   Current home BP readings: Patient stated she does not check her blood pressure readings.  What recent interventions/DTPs have been made by any provider to improve Blood Pressure control since last CPP Visit: None.  Any recent hospitalizations or ED visits since last visit with CPP? Patient stated no.   What diet changes have been made to improve Blood Pressure Control?  Patient stated she has been on a low cholesterol and has only been eating salads She stated she has been having trouble with acid reflux so she has been staying away from a lot of foods.  What exercise is being done to improve your Blood Pressure Control?  Patient stated exercises for 30 min daily.   Adherence Review: Is the patient currently on ACE/ARB medication? None.  Does the patient have >5 day gap between last estimated fill dates? Per misc rpts, no.  Star Rating Drugs: Atorvastatin 20 mg 90 DS 02/12/21 , Gabapentin 100 mg 30 DS 12/24/20.  Follow-Up:Pharmacist Review   02/14/21, RMA Clinical Pharmacist Assistant [phone]  5 minutes spent in review, coordination, and documentation.  Reviewed by: Hulen Luster, PharmD Clinical Pharmacist [facility] [phone]

## 2021-05-04 ENCOUNTER — Other Ambulatory Visit: Payer: Self-pay | Admitting: Family Medicine

## 2021-05-05 MED ORDER — GABAPENTIN 100 MG PO CAPS: 100.0000 mg | ORAL_CAPSULE | Freq: Three times a day (TID) | ORAL | 0 refills | Status: DC

## 2021-05-06 ENCOUNTER — Telehealth: Payer: Self-pay

## 2021-05-06 DIAGNOSIS — Z006 Encounter for examination for normal comparison and control in clinical research program: Secondary | ICD-10-CM

## 2021-05-06 NOTE — Telephone Encounter (Signed)
I called patient for her 90-day Identify Study follow up phone call. Patient is doing well. Patients states she was still having chest pain at rest and with exertion but it has since resolved. Patient followed up with her Cardiologist on 02/11/2021.  I reminded patient I would call her in Dec. for 1 year follow-up.

## 2021-05-17 ENCOUNTER — Other Ambulatory Visit: Payer: Self-pay | Admitting: Adult Health

## 2021-05-17 DIAGNOSIS — J438 Other emphysema: Secondary | ICD-10-CM

## 2021-05-17 DIAGNOSIS — R053 Chronic cough: Secondary | ICD-10-CM

## 2021-05-26 ENCOUNTER — Other Ambulatory Visit: Payer: Self-pay

## 2021-05-26 MED ORDER — FLUCONAZOLE 100 MG PO TABS: 100.0000 mg | ORAL_TABLET | Freq: Every day | ORAL | 0 refills | Status: AC

## 2021-05-27 ENCOUNTER — Other Ambulatory Visit: Payer: Self-pay | Admitting: Emergency Medicine

## 2021-05-27 DIAGNOSIS — J438 Other emphysema: Secondary | ICD-10-CM

## 2021-05-27 DIAGNOSIS — R053 Chronic cough: Secondary | ICD-10-CM

## 2021-05-27 MED ORDER — ARFORMOTEROL TARTRATE 15 MCG/2ML IN NEBU: 15.0000 ug | INHALATION_SOLUTION | Freq: Every day | RESPIRATORY_TRACT | 3 refills | Status: DC

## 2021-05-27 MED ORDER — IPRATROPIUM BROMIDE 0.02 % IN SOLN: 0.5000 mg | Freq: Every day | RESPIRATORY_TRACT | 3 refills | Status: DC

## 2021-05-28 ENCOUNTER — Telehealth: Payer: Self-pay | Admitting: Emergency Medicine

## 2021-05-28 DIAGNOSIS — J438 Other emphysema: Secondary | ICD-10-CM

## 2021-05-28 DIAGNOSIS — R053 Chronic cough: Secondary | ICD-10-CM

## 2021-05-28 DIAGNOSIS — J441 Chronic obstructive pulmonary disease with (acute) exacerbation: Secondary | ICD-10-CM

## 2021-05-28 MED ORDER — ARFORMOTEROL TARTRATE 15 MCG/2ML IN NEBU: 15.0000 ug | INHALATION_SOLUTION | Freq: Every day | RESPIRATORY_TRACT | 11 refills | Status: DC

## 2021-05-28 NOTE — Telephone Encounter (Signed)
[facility] calling bc quantity wrong on brovana and atrovent nebs  I corrected the brovana and sent this electronically The atrovent I am unsure about bc not mentioned last ov and the pt already on breztri as well  Dr Delton Coombes, are you wanting her to take the atrovent as well? Please advise and if so will send new rx to [facility], thanks

## 2021-05-31 NOTE — Telephone Encounter (Signed)
ATC [facility], they are now closed, will need to be tried again tomm.

## 2021-05-31 NOTE — Telephone Encounter (Signed)
Now that she is on the breztri, I don't want her on Atrovent nebs anymore. She can continue to do the brovana nebs bid as she has been.

## 2021-06-01 ENCOUNTER — Other Ambulatory Visit: Payer: Self-pay | Admitting: Emergency Medicine

## 2021-06-01 MED ORDER — ALBUTEROL SULFATE HFA 108 (90 BASE) MCG/ACT IN AERS: INHALATION_SPRAY | RESPIRATORY_TRACT | 5 refills | Status: DC

## 2021-06-01 NOTE — Telephone Encounter (Signed)
I called [facility] and she advised to just send an order to discontinue the Atrovent nebs.  Order sent.  Nothing further needed.

## 2021-06-16 ENCOUNTER — Other Ambulatory Visit: Payer: Medicare Other | Admitting: *Deleted

## 2021-06-16 ENCOUNTER — Other Ambulatory Visit: Payer: Self-pay

## 2021-06-16 DIAGNOSIS — E7800 Pure hypercholesterolemia, unspecified: Secondary | ICD-10-CM | POA: Diagnosis not present

## 2021-06-17 LAB — LIPID PANEL
Chol/HDL Ratio: 2.5 ratio (ref 0.0–4.4)
Cholesterol, Total: 193 mg/dL (ref 100–199)
HDL: 78 mg/dL (ref >39)
LDL Chol Calc (NIH): 102 mg/dL — ABNORMAL HIGH (ref 0–99)
Triglycerides: 72 mg/dL (ref 0–149)
VLDL Cholesterol Cal: 13 mg/dL (ref 5–40)

## 2021-06-17 LAB — ALT: ALT: 18 IU/L (ref 0–32)

## 2021-06-18 ENCOUNTER — Telehealth: Payer: Self-pay

## 2021-06-18 MED ORDER — ATORVASTATIN CALCIUM 40 MG PO TABS
40.0000 mg | ORAL_TABLET | Freq: Every day | ORAL | 3 refills | Status: DC
Start: 2021-06-18 — End: 2021-07-14

## 2021-06-18 NOTE — Telephone Encounter (Signed)
The patient has been notified of the result and verbalized understanding.  All questions (if any) were answered. Theresia Majors, RN 06/18/2021 4:01 PM

## 2021-06-18 NOTE — Telephone Encounter (Signed)
-----   Message from Quintella Reichert, MD sent at 06/17/2021  4:06 PM EDT ----- LDL not at goal of < 70 - increase atorvastatin to 40mg  daily and repeat FLP and ALT in 6 weeks

## 2021-06-22 ENCOUNTER — Telehealth: Payer: Self-pay | Admitting: *Deleted

## 2021-06-22 NOTE — Telephone Encounter (Signed)
Patient due for Cologuard re-screen.   Order placed via [employer].   Cologuard (Order [id number])

## 2021-06-24 NOTE — Telephone Encounter (Signed)
If she decides to go home, she will need to isolate from him, wash hands frequently and mask when around anywhere he is isolating. Will need to do this at least until he is 5 days symptom free.

## 2021-06-24 NOTE — Telephone Encounter (Signed)
Hello Dr. Delton Coombes, please see and advise on mychart message below, thanks!  My brother is staying with me and he has tested positive for covid. I had a test and I am negative.  Is it safe for me to stay there if he stays in his room and wears a mask whenever he has to be around me. Presently I'm at my daughter's but want to go home.

## 2021-06-29 MED ORDER — NIRMATRELVIR/RITONAVIR (PAXLOVID)TABLET: 3.0000 | ORAL_TABLET | Freq: Two times a day (BID) | ORAL | 0 refills | Status: AC

## 2021-06-29 NOTE — Telephone Encounter (Signed)
Dr. Francine Graven, please advise on mychart message. Dr. Delton Coombes is off today. Thanks!  I have been staying with my daughter but started having symptoms of covid. Today I tested positive for covid. Is there anything I need to do? I have a bad cough

## 2021-07-02 ENCOUNTER — Ambulatory Visit: Payer: Medicare Other | Admitting: Cardiology

## 2021-07-06 ENCOUNTER — Telehealth: Payer: Self-pay

## 2021-07-06 MED ORDER — DOXYCYCLINE HYCLATE 100 MG PO TABS: 100.0000 mg | ORAL_TABLET | Freq: Two times a day (BID) | ORAL | 0 refills | Status: DC

## 2021-07-06 NOTE — Telephone Encounter (Signed)
Will close message. Patient was contacted via phone as this is a sick call.

## 2021-07-06 NOTE — Telephone Encounter (Signed)
Please ask her to take doxycycline 100mg  bid x 7 days She does not tolerate prednisone so I will defer steroids.  She is at risk of progression of COVID >> have low threshold for her to go to ED to be evaluated if she continues to be SOB, sees desaturations or has worsening in any way.

## 2021-07-06 NOTE — Telephone Encounter (Signed)
Call made to patient due to email sent.   Patient reports she has a continous cough with thick green mucous that developed about 4 days ago, increased SOB. She uses 2L of O2, she reports her oxygen has been dropping into the low 80's. Patient is unable to complete sentences over the phone without coughing and or stopping to breathe. She confirms she is using brovana daily via neb and breztri as directed. Reports she is using the albuterol at least 4x/day. Denies fever. Patient reports she tested positive for covid around 4th of July. She is mainly concerned that her oxygen is dropping with the least amount of effort. Already taking delsym to help with cough. Also confirms taking mucinex and flonase. Patient was prescribed paxlovid 06/29/21.   RB please advise.

## 2021-07-06 NOTE — Telephone Encounter (Signed)
Called and spoke with patient, advised of recommendations per Dr. Delton Coombes.  She verbalized understanding.  Verified pharmacy and Doxycycline sent to pharmacy.  Nothing further needed.

## 2021-07-14 ENCOUNTER — Ambulatory Visit (INDEPENDENT_AMBULATORY_CARE_PROVIDER_SITE_OTHER): Payer: Medicare Other | Admitting: Cardiology

## 2021-07-14 ENCOUNTER — Encounter: Payer: Self-pay | Admitting: Cardiology

## 2021-07-14 VITALS — BP 104/74 | HR 52 | Ht 62.0 in | Wt 138.0 lb

## 2021-07-14 DIAGNOSIS — E7800 Pure hypercholesterolemia, unspecified: Secondary | ICD-10-CM

## 2021-07-14 DIAGNOSIS — I471 Supraventricular tachycardia: Secondary | ICD-10-CM

## 2021-07-14 DIAGNOSIS — I2510 Atherosclerotic heart disease of native coronary artery without angina pectoris: Secondary | ICD-10-CM | POA: Diagnosis not present

## 2021-07-14 LAB — LIPID PANEL
Chol/HDL Ratio: 2.5 ratio (ref 0.0–4.4)
Cholesterol, Total: 184 mg/dL (ref 100–199)
HDL: 74 mg/dL (ref >39)
LDL Chol Calc (NIH): 98 mg/dL (ref 0–99)
Triglycerides: 62 mg/dL (ref 0–149)
VLDL Cholesterol Cal: 12 mg/dL (ref 5–40)

## 2021-07-14 LAB — ALT: ALT: 17 IU/L (ref 0–32)

## 2021-07-14 MED ORDER — DILTIAZEM HCL ER COATED BEADS 180 MG PO CP24: 180.0000 mg | ORAL_CAPSULE | Freq: Every day | ORAL | 3 refills | Status: DC

## 2021-07-14 MED ORDER — ATORVASTATIN CALCIUM 40 MG PO TABS: 40.0000 mg | ORAL_TABLET | Freq: Every day | ORAL | 3 refills | Status: DC

## 2021-07-14 NOTE — Addendum Note (Signed)
Addended by: Theresia Majors on: 07/14/2021 10:02 AM   Modules accepted: Orders

## 2021-07-14 NOTE — Patient Instructions (Signed)
Medication Instructions:  Your physician recommends that you continue on your current medications as directed. Please refer to the Current Medication list given to you today.  *If you need a refill on your cardiac medications before your next appointment, please call your pharmacy*   Lab Work: TODAY: FLP, ALT If you have labs (blood work) drawn today and your tests are completely normal, you will receive your results only by: MyChart Message (if you have MyChart) OR A paper copy in the mail If you have any lab test that is abnormal or we need to change your treatment, we will call you to review the results.   Follow-Up: At [facility], you and your health needs are our priority.  As part of our continuing mission to provide you with exceptional heart care, we have created designated Provider Care Teams.  These Care Teams include your primary Cardiologist (physician) and Advanced Practice Providers (APPs -  Physician Assistants and Nurse Practitioners) who all work together to provide you with the care you need, when you need it.  Your next appointment:   1 year(s)  The format for your next appointment:   In Person  Provider:   You may see Traci Turner, MD or one of the following Advanced Practice Providers on your designated Care Team:   Dayna Dunn, PA-C Michele Lenze, PA-C  

## 2021-07-14 NOTE — Progress Notes (Signed)
Cardiology Office Note    Date:  07/14/2021   ID:  Betti, Dorin 1951/01/21, MRN [id number]  PCP:  Donita Brooks, MD  Cardiologist:  Armanda Magic, MD   Chief Complaint  Patient presents with   Coronary Artery Disease   Palpitations   Hyperlipidemia     History of Present Illness:  Taylor Burnett is a 70 y.o. female with a hx of COPD, preDM and GERD who is referred by her PCP for evaluation of chest pain and palpitations.  When I initially saw her she was complaining of CP that had  been off and on for a few months.  It was nonexertional and would come on suddenly and intense in the mid sternal area and occasionally would radiate into her right side as well as in her jaw.  The pain was associated with nausea but no diaphoresis.  She has chronic DOE related to severe COPD and says that she is always very SOB.    She also noticed fluttering in her chest at random times and was having spells where suddenly she felt like she was going to pass out.  She has never had syncope.  This could occur even at rest when just sitting.   She is on O2 all night and occasionally during the day.  She is a former smoker.  Her maternal GM died of an MI in her 103's.   She underwent coronary CTA showing mild CAD in the prox to mid RCA, prox LAD and prox LCx with 25-49% stenosis.  2D echo showed normal LVF with mild MR.  Event monitor showed PACs and nonsustained atrial tachycardia with a run of PAT with aberration vs NSVT for 11 beats.  She was started on Toprol XL 25mg  daily but she had to stop it because she could not tolerate it due to fatigue and could not function. She has chronic DOE due to COPD on chronic O2 at 2L.    She is here today for followup.  She has chronic SOB and is on chronic O2 at 2-3L 24/7.  She denies any chest pain or pressure, PND, orthopnea, LE edema or syncope. She recently has noticed increased fluttering of her heart over the past week but otherwise has been stable. She  occasionally has some dizziness but not really bothersome. She is compliant with her meds and is tolerating meds with no SE.     Past Medical History:  Diagnosis Date   Arthritis    CAD (coronary artery disease), native coronary artery    mild non obstructive CAD of the RCA, LCX, LAD up to 25-49% by coronary CTA 11/2020   Cataract    COPD (chronic obstructive pulmonary disease) (HCC)    Cough    DDD (degenerative disc disease), cervical    Dysrhythmia    RAPID HEART BEAT OCC    Emphysema of lung (HCC)    GERD (gastroesophageal reflux disease)    OCC   H/O degenerative disc disease    Headache    Myocardial infarction (HCC) 2016   Osteoporosis    Panic attacks    PAT (paroxysmal atrial tachycardia) (HCC)    noted on event monitor 11/2020   Pre-diabetes    Severe lung disease    Shortness of breath dyspnea    Sleep apnea    Spondylosis     Past Surgical History:  Procedure Laterality Date   ANTERIOR CERVICAL DECOMP/DISCECTOMY FUSION N/A 03/13/2015   Procedure: CERVICAL THREE-CERVICAL FOUR,  CERVICAL FOUR-CERVICAL FIVE, CERVICAL FIVE-CERVICAL SIX ANTERIOR CERVICAL DECOMPRESSION/DISCECTOMY FUSION 3 LEVELS;  Surgeon: Tia Alert, MD;  Location: [facility];  Service: Neurosurgery;  Laterality: N/A;   BIOPSY  03/25/2021   Procedure: BIOPSY;  Surgeon: Rachael Fee, MD;  Location: [facility];  Service: Endoscopy;;   BREAST ENHANCEMENT SURGERY  1990   CARDIAC CATHETERIZATION  1995   CATARACT EXTRACTION W/PHACO Right 05/02/2017   Procedure: CATARACT EXTRACTION PHACO AND INTRAOCULAR LENS PLACEMENT (IOC);  Surgeon: Jethro Bolus, MD;  Location: [facility];  Service: Ophthalmology;  Laterality: Right;  CDE: 6.22   CATARACT EXTRACTION W/PHACO Left 05/16/2017   Procedure: CATARACT EXTRACTION PHACO AND INTRAOCULAR LENS PLACEMENT (IOC);  Surgeon: Jethro Bolus, MD;  Location: [facility];  Service: Ophthalmology;  Laterality: Left;  CDE: 4.10   ESOPHAGOGASTRODUODENOSCOPY (EGD) WITH PROPOFOL N/A  03/25/2021   Procedure: ESOPHAGOGASTRODUODENOSCOPY (EGD) WITH PROPOFOL;  Surgeon: Rachael Fee, MD;  Location: [facility];  Service: Endoscopy;  Laterality: N/A;   TONSILLECTOMY     TUBAL LIGATION     VIDEO BRONCHOSCOPY Bilateral 12/31/2018   Procedure: VIDEO BRONCHOSCOPY WITHOUT FLUORO;  Surgeon: Leslye Peer, MD;  Location: [facility];  Service: Cardiopulmonary;  Laterality: Bilateral;    Current Medications: Current Meds  Medication Sig   albuterol (PROAIR HFA) 108 (90 Base) MCG/ACT inhaler INHALE 2 PUFFS INTO THE LUNGS EVERY 6 HOURS AS NEEDED.   arformoterol (BROVANA) 15 MCG/2ML NEBU Take 2 mLs (15 mcg total) by nebulization daily.   aspirin EC 81 MG tablet Take 1 tablet (81 mg total) by mouth daily. Swallow whole.   atorvastatin (LIPITOR) 40 MG tablet Take 1 tablet (40 mg total) by mouth daily.   Budeson-Glycopyrrol-Formoterol (BREZTRI AEROSPHERE) 160-9-4.8 MCG/ACT AERO Inhale 2 puffs into the lungs in the morning and at bedtime.   cetirizine (ZYRTEC) 10 MG tablet Take 10 mg by mouth at bedtime.    Cholecalciferol (VITAMIN D) 2000 units CAPS Take 2,000 Units by mouth daily.    diltiazem (CARDIZEM CD) 180 MG 24 hr capsule Take 1 capsule (180 mg total) by mouth daily.   doxycycline (VIBRA-TABS) 100 MG tablet Take 1 tablet (100 mg total) by mouth 2 (two) times daily.   fluticasone (FLONASE) 50 MCG/ACT nasal spray Place 2 sprays into both nostrils every evening.   gabapentin (NEURONTIN) 100 MG capsule Take 1 capsule (100 mg total) by mouth 3 (three) times daily.   Melatonin 10 MG TABS Take 10 mg by mouth at bedtime as needed (sleep).   naproxen sodium (ALEVE) 220 MG tablet Take 440 mg by mouth 2 (two) times daily as needed (pain).    OXYGEN Inhale 2 L into the lungs as needed. FOR USE WITH EXERTION   pantoprazole (PROTONIX) 40 MG tablet Take 1 tablet (40 mg total) by mouth daily.   Current Facility-Administered Medications for the 07/14/21 encounter (Office Visit) with Quintella Reichert, MD  Medication   denosumab (PROLIA) injection 60 mg    Allergies:   Prednisone and Toprol xl [metoprolol]   Social History   Socioeconomic History   Marital status: Widowed    Spouse name: Not on file   Number of children: Not on file   Years of education: Not on file   Highest education level: Not on file  Occupational History   Occupation: on disability.  Tobacco Use   Smoking status: Former    Packs/day: 2.00    Years: 40.00    Pack years: 80.00    Types: Cigarettes  Quit date: 07/31/2013    Years since quitting: 7.9   Smokeless tobacco: Never  Vaping Use   Vaping Use: Never used  Substance and Sexual Activity   Alcohol use: No    Comment: OCC WINE    Drug use: No   Sexual activity: Not on file  Other Topics Concern   Not on file  Social History Narrative   Divorced and lives alone.    Social Determinants of Health   Financial Resource Strain: Not on file  Food Insecurity: Not on file  Transportation Needs: Not on file  Physical Activity: Not on file  Stress: Not on file  Social Connections: Not on file     Family History:  The patient's family history includes Allergies in her brother, mother, and son; Cancer in an other family member; Emphysema in her father; Heart disease in her maternal grandmother; Rheum arthritis in her brother.   ROS:   Please see the history of present illness.    ROS All other systems reviewed and are negative.  No flowsheet data found.  PHYSICAL EXAM:   VS:  BP 104/74   Pulse (!) 52   Ht 5\' 2"  (1.575 m)   Wt 138 lb (62.6 kg)   SpO2 96%   BMI 25.24 kg/m     GEN: Well nourished, well developed in no acute distress HEENT: Normal NECK: No JVD; No carotid bruits LYMPHATICS: No lymphadenopathy CARDIAC:RRR, no murmurs, rubs, gallops RESPIRATORY:  decreased BS at bases ABDOMEN: Soft, non-tender, non-distended MUSCULOSKELETAL:  No edema; No deformity  SKIN: Warm and dry NEUROLOGIC:  Alert and oriented x  3 PSYCHIATRIC:  Normal affect   Wt Readings from Last 3 Encounters:  07/14/21 138 lb (62.6 kg)  03/25/21 146 lb (66.2 kg)  03/05/21 144 lb 9.6 oz (65.6 kg)      Studies/Labs Reviewed:   EKG:  EKG is not ordered today.    Coronary CTA 11/2020 Aorta: Normal size. Mild diffuse atherosclerotic plaque and calcifications. No dissection.   Aortic Valve:  Trileaflet.  No calcifications.   Coronary Arteries:  Normal coronary origin.  Right dominance.   RCA is a large dominant artery that gives rise to PDA and PLA. There is mild diffuse calcified plaque in the proximal and mid RCA with stenoses 25-49%. Distal RCA PLA and PDA have only minimal irregularities.   Left main is a large caliber, short artery that gives rise to LAD and LCX arteries. Left main has no plaque.   LAD is a large vessel that gives rise to a large diagonal artery. There is diffuse mild calcified plaque in the proximal LAD with stenosis 25-49%. Mid and distal LAD has small lumen and only luminal irregularities.   D1 is a large lumen artery that has minimal calcified plaque in the ostial and proximal segment with stenosis 0-25%. Mid and distal portion has only luminal irregularities.   LCX is a large non-dominant artery that gives rise to one large OM1 branch. There is mild plaque in the proximal LAD with stenosis 25-49%.   OM1 is a large artery with minimal plaque.   Other findings:   Normal pulmonary vein drainage into the left atrium.   Normal left atrial appendage without a thrombus.   Upper normal size of the pulmonary artery measuring 29 mm.   IMPRESSION: 1. Coronary calcium score of 289. This was 489 percentile for age and sex matched control.   2. Normal coronary origin with right dominance.   3. CAD-RADS 2.  Mild non-obstructive CAD (25-49%) in the proximal portions of RCA, LAD and LCX arteries. Consider non-atherosclerotic causes of chest pain. Aggressive risk factor modification  is Recommended.  2D echo 12/2020 IMPRESSIONS    1. Left ventricular ejection fraction, by estimation, is 60 to 65%. The  left ventricle has normal function. The left ventricle has no regional  wall motion abnormalities. Left ventricular diastolic parameters are  consistent with Grade I diastolic  dysfunction (impaired relaxation).   2. Right ventricular systolic function is normal. The right ventricular  size is normal.   3. The mitral valve is normal in structure. Mild mitral valve  regurgitation. No evidence of mitral stenosis.   4. The aortic valve is normal in structure. Aortic valve regurgitation is  not visualized. No aortic stenosis is present.   5. The inferior vena cava is normal in size with greater than 50%  respiratory variability, suggesting right atrial pressure of 3 mmHg.   Event Monitor 12/2020 Study Highlights  Normal sinus rhytm and sinus tachycardia. The average heart rate in sinus was 84bpm and ranged from 64 to 128bpm. Frequent PAC's, atrial couplets and nonsustained atrial tachycardia up to 188bpm. Wide complex tachycardia for 11 beats possible nonsustained ventricular tachycardia vs. nonsustained atrial tachycardia with aberration.    Recent Labs: 10/08/2020: Brain Natriuretic Peptide 25; Hemoglobin 14.5; Platelets 283 12/10/2020: BUN 22; Creatinine, Ser 0.87; Potassium 4.4; Sodium 141 06/16/2021: ALT 18   Lipid Panel    Component Value Date/Time   CHOL 193 06/16/2021 1345   TRIG 72 06/16/2021 1345   HDL 78 06/16/2021 1345   CHOLHDL 2.5 06/16/2021 1345   CHOLHDL 2.9 04/20/2018 1535   LDLCALC 102 (H) 06/16/2021 1345   LDLCALC 114 (H) 04/20/2018 1535    Additional studies/ records that were reviewed today include:  Office notes from PCP    ASSESSMENT:    1. Coronary artery disease involving native coronary artery of native heart without angina pectoris   2. PAT (paroxysmal atrial tachycardia) (HCC)   3. Pure hypercholesterolemia      PLAN:   In order of problems listed above:  1.  ASCAD/Chest pain -coronary CTA showed mild CAD in the prox to mid RCA, prox LAD and prox LCx with 25-49% stenosis.  -CP felt to be noncardiac in origin -2D echo showed normal LVF with mild MR.  -no BB due to fatigue on Toprol -she has not had any anginal symptoms -Continue prescription drug management with Cardizem CD 180mg  daily, ASA 81mg  daily and statin>refilled for 1 year  2.   Palpitations/PAT -she has random palpitations lasting a few minutes at a time but more concerning are the "sinking" spells she is having even when at rest sitting in a chair which concerns me for possible arrhythmia - Event monitor showed PACs and nonsustained atrial tachycardia with a run of PAT with aberration vs NSVT for 11 beats.  -over the past week she has had more palpitations but remains in NSR on exam today.   -stopped Toprol due to fatigue -cannot increase CCB further due to soft BP and bradycardia -Continue prescription drug management with Cardizem CD 180mg  daily >refilled for 1 year  3.  HLD -LDL goal < 70 -I have personally reviewed and interpreted outside labs performed by patient's PCP which showed LDL 102, HDL 78, TAG 72, ALT 18>>atorvastatin was increased to 40mg  daily at that time -check FLP and ALT today  Medication Adjustments/Labs and Tests Ordered: Current medicines are reviewed at length with the patient today.  Concerns regarding medicines are outlined above.  Medication changes, Labs and Tests ordered today are listed in the Patient Instructions below.  There are no Patient Instructions on file for this visit.   Signed, Armanda Magic, MD  07/14/2021 9:51 AM    [facility] [address] [place], [place], [place]  [address] Phone: [phone]; Fax: [phone]

## 2021-07-15 ENCOUNTER — Telehealth: Payer: Self-pay

## 2021-07-15 DIAGNOSIS — Z1211 Encounter for screening for malignant neoplasm of colon: Secondary | ICD-10-CM | POA: Diagnosis not present

## 2021-07-15 DIAGNOSIS — E7800 Pure hypercholesterolemia, unspecified: Secondary | ICD-10-CM

## 2021-07-15 DIAGNOSIS — Z1212 Encounter for screening for malignant neoplasm of rectum: Secondary | ICD-10-CM | POA: Diagnosis not present

## 2021-07-15 LAB — COLOGUARD: Cologuard: NEGATIVE

## 2021-07-15 MED ORDER — ATORVASTATIN CALCIUM 80 MG PO TABS: 80.0000 mg | ORAL_TABLET | Freq: Every day | ORAL | 3 refills | Status: DC

## 2021-07-15 NOTE — Telephone Encounter (Signed)
-----   Message from Quintella Reichert, MD sent at 07/15/2021 10:31 AM EDT ----- LDL still above goal - please find out if she has missed any doses of her statin and if no then increase atorvastatin to 80mg  daily and repeat FLp and ALT in 6 weeks

## 2021-07-15 NOTE — Telephone Encounter (Signed)
The patient has been notified of the result and verbalized understanding.  All questions (if any) were answered. Theresia Majors, RN 07/15/2021 3:06 PM  Rx has been sent in for atorvastatin 80 mg. Repeat lab work has been scheduled.

## 2021-07-20 ENCOUNTER — Other Ambulatory Visit: Payer: Self-pay

## 2021-07-20 ENCOUNTER — Ambulatory Visit (INDEPENDENT_AMBULATORY_CARE_PROVIDER_SITE_OTHER): Payer: Medicare Other | Admitting: Family Medicine

## 2021-07-20 ENCOUNTER — Encounter: Payer: Self-pay | Admitting: Family Medicine

## 2021-07-20 VITALS — BP 110/60 | HR 88 | Temp 97.9°F | Resp 14 | Ht 62.0 in | Wt 139.0 lb

## 2021-07-20 DIAGNOSIS — R5382 Chronic fatigue, unspecified: Secondary | ICD-10-CM | POA: Diagnosis not present

## 2021-07-20 DIAGNOSIS — G629 Polyneuropathy, unspecified: Secondary | ICD-10-CM

## 2021-07-20 DIAGNOSIS — I2510 Atherosclerotic heart disease of native coronary artery without angina pectoris: Secondary | ICD-10-CM

## 2021-07-20 DIAGNOSIS — M79605 Pain in left leg: Secondary | ICD-10-CM | POA: Diagnosis not present

## 2021-07-20 DIAGNOSIS — M79604 Pain in right leg: Secondary | ICD-10-CM | POA: Diagnosis not present

## 2021-07-20 MED ORDER — GABAPENTIN 300 MG PO CAPS: 300.0000 mg | ORAL_CAPSULE | Freq: Three times a day (TID) | ORAL | 3 refills | Status: DC

## 2021-07-20 NOTE — Progress Notes (Signed)
Subjective:    Patient ID: Taylor Burnett, female    DOB: 19-Dec-1951, 70 y.o.   MRN: [id number]  HPI Patient is a very pleasant 70 year old Caucasian female who presents today complaining of burning pain in both feet.  She has diminished sensation to 10 g monofilament per tickly in the second through fifth toes bilaterally.  She also has diminished sensation in her feet to vibration suggesting underlying peripheral neuropathy.  She denies any pain radiating down her legs.  The pain initiates on the plantar surfaces of both feet and will radiate up to her mid shins.  Is particularly worse at night.  She states that she tried gabapentin in the past 100 mg 3 times a day and it helped however it seems to have lost its effectiveness. Past Medical History:  Diagnosis Date   Arthritis    CAD (coronary artery disease), native coronary artery    mild non obstructive CAD of the RCA, LCX, LAD up to 25-49% by coronary CTA 11/2020   Cataract    COPD (chronic obstructive pulmonary disease) (HCC)    Cough    DDD (degenerative disc disease), cervical    Dysrhythmia    RAPID HEART BEAT OCC    Emphysema of lung (HCC)    GERD (gastroesophageal reflux disease)    OCC   H/O degenerative disc disease    Headache    Myocardial infarction (HCC) 2016   Osteoporosis    Panic attacks    PAT (paroxysmal atrial tachycardia) (HCC)    noted on event monitor 11/2020   Pre-diabetes    Severe lung disease    Shortness of breath dyspnea    Sleep apnea    Spondylosis    Past Surgical History:  Procedure Laterality Date   ANTERIOR CERVICAL DECOMP/DISCECTOMY FUSION N/A 03/13/2015   Procedure: CERVICAL THREE-CERVICAL FOUR, CERVICAL FOUR-CERVICAL FIVE, CERVICAL FIVE-CERVICAL SIX ANTERIOR CERVICAL DECOMPRESSION/DISCECTOMY FUSION 3 LEVELS;  Surgeon: Tia Alert, MD;  Location: [facility];  Service: Neurosurgery;  Laterality: N/A;   BIOPSY  03/25/2021   Procedure: BIOPSY;  Surgeon: Rachael Fee, MD;  Location:  [facility];  Service: Endoscopy;;   BREAST ENHANCEMENT SURGERY  1990   CARDIAC CATHETERIZATION  1995   CATARACT EXTRACTION W/PHACO Right 05/02/2017   Procedure: CATARACT EXTRACTION PHACO AND INTRAOCULAR LENS PLACEMENT (IOC);  Surgeon: Jethro Bolus, MD;  Location: [facility];  Service: Ophthalmology;  Laterality: Right;  CDE: 6.22   CATARACT EXTRACTION W/PHACO Left 05/16/2017   Procedure: CATARACT EXTRACTION PHACO AND INTRAOCULAR LENS PLACEMENT (IOC);  Surgeon: Jethro Bolus, MD;  Location: [facility];  Service: Ophthalmology;  Laterality: Left;  CDE: 4.10   ESOPHAGOGASTRODUODENOSCOPY (EGD) WITH PROPOFOL N/A 03/25/2021   Procedure: ESOPHAGOGASTRODUODENOSCOPY (EGD) WITH PROPOFOL;  Surgeon: Rachael Fee, MD;  Location: [facility];  Service: Endoscopy;  Laterality: N/A;   TONSILLECTOMY     TUBAL LIGATION     VIDEO BRONCHOSCOPY Bilateral 12/31/2018   Procedure: VIDEO BRONCHOSCOPY WITHOUT FLUORO;  Surgeon: Leslye Peer, MD;  Location: [facility];  Service: Cardiopulmonary;  Laterality: Bilateral;   Current Outpatient Medications on File Prior to Visit  Medication Sig Dispense Refill   albuterol (PROAIR HFA) 108 (90 Base) MCG/ACT inhaler INHALE 2 PUFFS INTO THE LUNGS EVERY 6 HOURS AS NEEDED. 18 g 5   arformoterol (BROVANA) 15 MCG/2ML NEBU Take 2 mLs (15 mcg total) by nebulization daily. 60 mL 11   aspirin EC 81 MG tablet Take 1 tablet (81 mg total) by mouth daily. Swallow  whole. 90 tablet 3   atorvastatin (LIPITOR) 80 MG tablet Take 1 tablet (80 mg total) by mouth daily. 90 tablet 3   Budeson-Glycopyrrol-Formoterol (BREZTRI AEROSPHERE) 160-9-4.8 MCG/ACT AERO Inhale 2 puffs into the lungs in the morning and at bedtime. 10.7 g 5   Cholecalciferol (VITAMIN D) 2000 units CAPS Take 2,000 Units by mouth daily.      diltiazem (CARDIZEM CD) 180 MG 24 hr capsule Take 1 capsule (180 mg total) by mouth daily. 90 capsule 3   fluticasone (FLONASE) 50 MCG/ACT nasal spray Place 2 sprays into both nostrils every  evening. 16 mL 12   Melatonin 10 MG TABS Take 10 mg by mouth at bedtime as needed (sleep).     naproxen sodium (ALEVE) 220 MG tablet Take 440 mg by mouth 2 (two) times daily as needed (pain).      OXYGEN Inhale 2 L into the lungs as needed. FOR USE WITH EXERTION     pantoprazole (PROTONIX) 40 MG tablet Take 1 tablet (40 mg total) by mouth daily. 30 tablet 11   Current Facility-Administered Medications on File Prior to Visit  Medication Dose Route Frequency Provider Last Rate Last Admin   denosumab (PROLIA) injection 60 mg  60 mg Subcutaneous Q6 months Donita Brooks, MD   60 mg at 02/13/19 1553   Allergies  Allergen Reactions   Prednisone Other (See Comments)    Depression and suicidal   Toprol Xl [Metoprolol] Other (See Comments)    Fatigue   Social History   Socioeconomic History   Marital status: Widowed    Spouse name: Not on file   Number of children: Not on file   Years of education: Not on file   Highest education level: Not on file  Occupational History   Occupation: on disability.  Tobacco Use   Smoking status: Former    Packs/day: 2.00    Years: 40.00    Pack years: 80.00    Types: Cigarettes    Quit date: 07/31/2013    Years since quitting: 7.9   Smokeless tobacco: Never  Vaping Use   Vaping Use: Never used  Substance and Sexual Activity   Alcohol use: No    Comment: OCC WINE    Drug use: No   Sexual activity: Not on file  Other Topics Concern   Not on file  Social History Narrative   Divorced and lives alone.    Social Determinants of Health   Financial Resource Strain: Not on file  Food Insecurity: Not on file  Transportation Needs: Not on file  Physical Activity: Not on file  Stress: Not on file  Social Connections: Not on file  Intimate Partner Violence: Not on file      Review of Systems     Objective:   Physical Exam Vitals reviewed.  Constitutional:      General: She is not in acute distress.    Appearance: She is not  diaphoretic.  HENT:     Head: Normocephalic and atraumatic.     Nose: Nose normal.  Eyes:     Conjunctiva/sclera: Conjunctivae normal.     Pupils: Pupils are equal, round, and reactive to light.  Cardiovascular:     Rate and Rhythm: Normal rate. Rhythm irregular.     Heart sounds: Normal heart sounds. No murmur heard.   No friction rub. No gallop.  Pulmonary:     Effort: Pulmonary effort is normal.     Breath sounds: Examination of the right-lower field reveals  rales. Examination of the left-lower field reveals rales. Rales present. No wheezing or rhonchi.  Abdominal:     Palpations: Abdomen is soft.  Musculoskeletal:     Cervical back: Neck supple.     Right foot: Normal range of motion. No swelling, deformity, tenderness or bony tenderness. Normal pulse.     Left foot: Normal range of motion. No swelling, deformity, tenderness or bony tenderness. Normal pulse.   Patient has chronic bibasilar crackles.  This was present on every office visit dating back to 2019 when she establish care.  This is not a new finding.  However today on exam, her heart rate is irregular.       Assessment & Plan:  Neuropathy - Plan: TSH, COMPLETE METABOLIC PANEL WITH GFR, CBC with Differential/Platelet  Polyneuropathy - Plan: TSH, COMPLETE METABOLIC PANEL WITH GFR, CBC with Differential/Platelet  Chronic fatigue - Plan: TSH, COMPLETE METABOLIC PANEL WITH GFR, CBC with Differential/Platelet  Pain in both lower extremities - Plan: TSH, COMPLETE METABOLIC PANEL WITH GFR, CBC with Differential/Platelet Patient has polyneuropathy.  Increase gabapentin to 300 mg p.o. 3 times daily and check TSH CMP and CBC.  Reassess in 2 to 3 weeks to see if gabapentin is helping

## 2021-07-21 LAB — COMPLETE METABOLIC PANEL WITH GFR
AG Ratio: 1.7 (calc) (ref 1.0–2.5)
ALT: 16 U/L (ref 6–29)
AST: 14 U/L (ref 10–35)
Albumin: 4 g/dL (ref 3.6–5.1)
Alkaline phosphatase (APISO): 60 U/L (ref 37–153)
BUN: 11 mg/dL (ref 7–25)
CO2: 31 mmol/L (ref 20–32)
Calcium: 9.5 mg/dL (ref 8.6–10.4)
Chloride: 101 mmol/L (ref 98–110)
Creat: 0.96 mg/dL (ref 0.50–1.05)
Globulin: 2.3 g/dL (calc) (ref 1.9–3.7)
Glucose, Bld: 106 mg/dL — ABNORMAL HIGH (ref 65–99)
Potassium: 3.6 mmol/L (ref 3.5–5.3)
Sodium: 141 mmol/L (ref 135–146)
Total Bilirubin: 0.5 mg/dL (ref 0.2–1.2)
Total Protein: 6.3 g/dL (ref 6.1–8.1)
eGFR: 64 mL/min/{1.73_m2} (ref >=60)

## 2021-07-21 LAB — CBC WITH DIFFERENTIAL/PLATELET
Absolute Monocytes: 490 cells/uL (ref 200–950)
Basophils Absolute: 41 cells/uL (ref 0–200)
Basophils Relative: 0.6 %
Eosinophils Absolute: 248 cells/uL (ref 15–500)
Eosinophils Relative: 3.6 %
HCT: 42.7 % (ref 35.0–45.0)
Hemoglobin: 14 g/dL (ref 11.7–15.5)
Lymphs Abs: 1932 cells/uL (ref 850–3900)
MCH: 30.2 pg (ref 27.0–33.0)
MCHC: 32.8 g/dL (ref 32.0–36.0)
MCV: 92.2 fL (ref 80.0–100.0)
MPV: 8.9 fL (ref 7.5–12.5)
Monocytes Relative: 7.1 %
Neutro Abs: 4188 cells/uL (ref 1500–7800)
Neutrophils Relative %: 60.7 %
Platelets: 263 10*3/uL (ref 140–400)
RBC: 4.63 10*6/uL (ref 3.80–5.10)
RDW: 12.5 % (ref 11.0–15.0)
Total Lymphocyte: 28 %
WBC: 6.9 10*3/uL (ref 3.8–10.8)

## 2021-07-21 LAB — TSH: TSH: 1.71 mIU/L (ref 0.40–4.50)

## 2021-07-28 NOTE — Telephone Encounter (Signed)
Received the results of Cologuard screening.   Screening noted negative.   A negative result indicates a low likelihood of colorectal cancer is present. Following a negative Cologuard result, the [employer] Society recommends a Cologuard re-screening interval of 3 years.   Letter sent.   

## 2021-08-12 ENCOUNTER — Telehealth: Payer: Self-pay | Admitting: Pharmacist

## 2021-08-12 NOTE — Progress Notes (Addendum)
Chronic Care Management Pharmacy Assistant   Name: Taylor Burnett  MRN: [id number] DOB: 07-14-1951  Reason for Encounter: Disease State For HTN.    Conditions to be addressed/monitored: COPD, GERD, Prediabetes, anxiety.  Recent office visits:  07/20/21 Dr. Tanya Nones For neuropathy DECREASED Gabapentin to 300 mg 3 times daily.   Recent consult visits: 07/14/21 Cardiology Quintella Reichert, MD. For follow-up. No medication changes. 06/23/21 (Patient message) Martina Sinner, MD. For COVID. STARTED Paxlovid 20 x 150 MG & 10 x 100 MG Take 3 tablets by mouth 2 (two) times daily for 5 days. Patient GFR is 69. Take nirmatrelvir (150 mg) two tablets twice daily for 5 days and ritonavir (100 mg) one tablet twice daily for 5 days., Starting Tue 06/29/2021, Until Sun 07/04/2021, Normal 05/31/21 (Patient message) [facility]. Dr. Delton Coombes. STOPPED Ipratropium Bromide.   Hospital visits:  None since 04/29/21  Medications: Outpatient Encounter Medications as of 08/12/2021  Medication Sig   albuterol (PROAIR HFA) 108 (90 Base) MCG/ACT inhaler INHALE 2 PUFFS INTO THE LUNGS EVERY 6 HOURS AS NEEDED.   arformoterol (BROVANA) 15 MCG/2ML NEBU Take 2 mLs (15 mcg total) by nebulization daily.   aspirin EC 81 MG tablet Take 1 tablet (81 mg total) by mouth daily. Swallow whole.   atorvastatin (LIPITOR) 80 MG tablet Take 1 tablet (80 mg total) by mouth daily.   Budeson-Glycopyrrol-Formoterol (BREZTRI AEROSPHERE) 160-9-4.8 MCG/ACT AERO Inhale 2 puffs into the lungs in the morning and at bedtime.   Cholecalciferol (VITAMIN D) 2000 units CAPS Take 2,000 Units by mouth daily.    diltiazem (CARDIZEM CD) 180 MG 24 hr capsule Take 1 capsule (180 mg total) by mouth daily.   fluticasone (FLONASE) 50 MCG/ACT nasal spray Place 2 sprays into both nostrils every evening.   gabapentin (NEURONTIN) 300 MG capsule Take 1 capsule (300 mg total) by mouth 3 (three) times daily.   Melatonin 10 MG TABS Take 10 mg by mouth at  bedtime as needed (sleep).   naproxen sodium (ALEVE) 220 MG tablet Take 440 mg by mouth 2 (two) times daily as needed (pain).    OXYGEN Inhale 2 L into the lungs as needed. FOR USE WITH EXERTION   pantoprazole (PROTONIX) 40 MG tablet Take 1 tablet (40 mg total) by mouth daily.   Facility-Administered Encounter Medications as of 08/12/2021  Medication   denosumab (PROLIA) injection 60 mg   Reviewed chart prior to disease state call. Spoke with patient regarding BP  Recent Office Vitals: BP Readings from Last 3 Encounters:  07/20/21 110/60  07/14/21 104/74  03/25/21 109/65   Pulse Readings from Last 3 Encounters:  07/20/21 88  07/14/21 (!) 52  03/25/21 80    Wt Readings from Last 3 Encounters:  07/20/21 139 lb (63 kg)  07/14/21 138 lb (62.6 kg)  03/25/21 146 lb (66.2 kg)     Kidney Function Lab Results  Component Value Date/Time   CREATININE 0.96 07/20/2021 01:07 PM   CREATININE 0.87 12/10/2020 03:17 PM   CREATININE 0.87 10/08/2020 03:14 PM   GFRNONAA 69 12/10/2020 03:17 PM   GFRNONAA 68 10/08/2020 03:14 PM   GFRAA 79 12/10/2020 03:17 PM   GFRAA 79 10/08/2020 03:14 PM    BMP Latest Ref Rng & Units 07/20/2021 12/10/2020 10/08/2020  Glucose 65 - 99 mg/dL 974(B) 638(G) 85  BUN 7 - 25 mg/dL 11 22 16   Creatinine 0.50 - 1.05 mg/dL 5.36 4.68  BUN/Creat Ratio 6 - 22 (calc) NOT APPLICABLE 25  NOT APPLICABLE  Sodium 135 - 146 mmol/L 141 141 141  Potassium 3.5 - 5.3 mmol/L 3.6 4.4 4.1  Chloride 98 - 110 mmol/L 101 99 101  CO2 20 - 32 mmol/L 31 28 28   Calcium 8.6 - 10.4 mg/dL 9.5 9.9 9.5    Current antihypertensive regimen:  Diltiazem 180 mg daily   How often are you checking your Blood Pressure? Patient stated when feeling symptomatic  Current home BP readings: Patient stated she only checks her blood pressure when she feels symptomatic.   What recent interventions/DTPs have been made by any provider to improve Blood Pressure control since last CPP Visit: None.   Any  recent hospitalizations or ED visits since last visit with CPP? Patient stated no.   What diet changes have been made to improve Blood Pressure Control?  Patient stated nothing has changed, she still is on a low cholesterol diet.   What exercise is being done to improve your Blood Pressure Control?  Patient stated she still exercises for 30 min daily.   Adherence Review: Is the patient currently on ACE/ARB medication? N/A.  Does the patient have >5 day gap between last estimated fill dates? N/A.   Star Rating Drugs: Atorvastatin 80 mg 90 DS 07/15/21.   Patient stated she did not have any questions or concerns about her medications at this time.    Follow-Up:Pharmacist Review  07/17/21, RMA Clinical Pharmacist Assistant [phone]  10 minutes spent in review, coordination, and documentation.  Reviewed by: [phone], PharmD Clinical Pharmacist [phone]

## 2021-08-19 ENCOUNTER — Encounter: Payer: Self-pay | Admitting: Family Medicine

## 2021-08-20 MED ORDER — GABAPENTIN 100 MG PO CAPS: 100.0000 mg | ORAL_CAPSULE | Freq: Three times a day (TID) | ORAL | 3 refills | Status: DC

## 2021-08-25 ENCOUNTER — Ambulatory Visit (INDEPENDENT_AMBULATORY_CARE_PROVIDER_SITE_OTHER): Payer: Medicare Other | Admitting: Emergency Medicine

## 2021-08-25 ENCOUNTER — Encounter: Payer: Self-pay | Admitting: Emergency Medicine

## 2021-08-25 ENCOUNTER — Ambulatory Visit (INDEPENDENT_AMBULATORY_CARE_PROVIDER_SITE_OTHER): Payer: Medicare Other

## 2021-08-25 ENCOUNTER — Other Ambulatory Visit: Payer: Self-pay

## 2021-08-25 DIAGNOSIS — U071 COVID-19: Secondary | ICD-10-CM | POA: Diagnosis not present

## 2021-08-25 DIAGNOSIS — J9611 Chronic respiratory failure with hypoxia: Secondary | ICD-10-CM

## 2021-08-25 DIAGNOSIS — J438 Other emphysema: Secondary | ICD-10-CM | POA: Diagnosis not present

## 2021-08-25 DIAGNOSIS — I2510 Atherosclerotic heart disease of native coronary artery without angina pectoris: Secondary | ICD-10-CM

## 2021-08-25 DIAGNOSIS — J9690 Respiratory failure, unspecified, unspecified whether with hypoxia or hypercapnia: Secondary | ICD-10-CM | POA: Diagnosis not present

## 2021-08-25 DIAGNOSIS — R0902 Hypoxemia: Secondary | ICD-10-CM | POA: Diagnosis not present

## 2021-08-25 NOTE — Patient Instructions (Addendum)
Start taking your Brovana nebulizer twice a day. Continue your Breztri 2 puffs twice a day.  Rinse and gargle after you use it. Continue albuterol 2 puffs or 1 nebulizer treatment up to every 4 hours if needed for shortness of breath, chest tightness, wheezing. Continue your oxygen at 2 L/min with all exertion. Chest x-ray today We can discuss the timing of a repeat CT scan of your chest at your next visit.  We will probably repeat it in March 2023. Continue your Flonase daily Follow with Dr Delton Coombes in 3 months or sooner if you have any problems.

## 2021-08-25 NOTE — Assessment & Plan Note (Signed)
Continue current oxygen at 2 L/min with all exertion

## 2021-08-25 NOTE — Progress Notes (Signed)
   Subjective:    Patient ID: Taylor Burnett, female    DOB: [birth year], 70 y.o.   MRN: [id number]  Shortness of Breath  ROV 03/05/21 --70 year old woman with very severe COPD and associated chronic hypoxemic respiratory failure.  She does not tolerate prednisone but is on cycled antibiotics.  She is doing Brovana and ipratropium in the morning, Breztri mid day and then evening.  She is colonized with Mycobacterium gordonae and returns today to review her CT chest. Remains on allergy and GERD regimens. More dry cough lately especially in the am. She doesn;t [place] any albuterol because it doesn't seem to help her.   She is dyspneic with exertion. Her BD's don't hold her for 12 hours.   High-resolution CT scan of the chest done 03/03/2021 reviewed by me, shows stable scattered pulmonary nodules including stable 1.0 cm lateral right middle lobe groundglass nodule.  ROV 08/25/21 --Taylor Burnett is 3, has a history of very severe COPD with associated chronic hypoxemic respiratory failure.  She is also colonized with Mycobacterium gordonae, has scattered pulmonary nodular disease associated with this.  She has chronic cough in the setting of allergic rhinitis and GERD. She had COVID in June - has been more SOB since then. She is still able to exert but more winded. Wears her O2 with significant exertion. Coughs mainly in the am. She has some nocturnal awakenings w dyspnea.  Her bronchodilator regimen currently includes Brovana in the am, ipratropium nebulizer. Breztri twice a day, mid-day and evening. Albuterol 4x a day.  Flonase daily. No longer on cycled abx.     Review of Systems  Respiratory:  Positive for shortness of breath.  As per HPI     Objective:   Physical Exam Vitals:   08/25/21 1421  BP: 124/70  Pulse: 93  Temp: 97.9 F (36.6 C)  TempSrc: Oral  SpO2: 90%  Weight: 137 lb 6.4 oz (62.3 kg)  Height: 5\' 1"  (1.549 m)   Gen: Pleasant, well-nourished, in no distress,  normal affect  ENT: No  lesions,  mouth clear,  oropharynx clear, no postnasal drip  Neck: No JVD, no stridor  Lungs: No use of accessory muscles, very distant, clear, no wheeze on normal breath.   Cardiovascular: RRR, heart sounds normal, no murmur or gallops, no peripheral edema  Musculoskeletal: No deformities, no cyanosis or clubbing  Neuro: alert, non focal  Skin: Warm, no lesions or rashes      Assessment & Plan:  COPD (chronic obstructive pulmonary disease) with emphysema (HCC) Severe disease, quite limited but no current exacerbation.  No wheezing on exam.  She is on maximal bronchodilator therapy (actually some redundancy with the Brovana and the [facility]) but still feels dyspnea.  She does not tolerate prednisone, otherwise I would probably have her on scheduled corticosteroids.  May be approaching end-stage and may need to discuss palliation with her at some point.  For now I think will be reasonable for her to increase the bravado to twice daily, continue the [place] twice a day which she takes at midday and then in the evening.  Chronic respiratory failure (HCC) Continue current oxygen at 2 L/min with all exertion  [place], MD, PhD 08/25/2021, 2:53 PM [facility] [phone] or if no answer [phone]

## 2021-08-25 NOTE — Assessment & Plan Note (Signed)
Severe disease, quite limited but no current exacerbation.  No wheezing on exam.  She is on maximal bronchodilator therapy (actually some redundancy with the Brovana and the [facility]) but still feels dyspnea.  She does not tolerate prednisone, otherwise I would probably have her on scheduled corticosteroids.  May be approaching end-stage and may need to discuss palliation with her at some point.  For now I think will be reasonable for her to increase the bravado to twice daily, continue the [place] twice a day which she takes at midday and then in the evening.

## 2021-08-25 NOTE — Addendum Note (Signed)
Addended by: Kerin Ransom on: 08/25/2021 03:23 PM   Modules accepted: Orders

## 2021-08-27 ENCOUNTER — Other Ambulatory Visit: Payer: Self-pay | Admitting: Emergency Medicine

## 2021-08-31 ENCOUNTER — Telehealth: Payer: Self-pay

## 2021-08-31 ENCOUNTER — Other Ambulatory Visit: Payer: Self-pay

## 2021-08-31 ENCOUNTER — Encounter: Payer: Self-pay | Admitting: *Deleted

## 2021-08-31 MED ORDER — FLUCONAZOLE 100 MG PO TABS: 100.0000 mg | ORAL_TABLET | Freq: Every day | ORAL | 0 refills | Status: AC

## 2021-08-31 NOTE — Telephone Encounter (Signed)
Amy This patient has an appointment scheduled for follow up in October. She has reached out to [place] with complaints of sensations of something in her throat, sore throat and belching. These are the symptoms she experiences when she has developed candida esophagitis. She was treated empirically by Dr Christella Hartigan last in May. There are not any earlier openings on the schedule. Please advise.

## 2021-08-31 NOTE — Telephone Encounter (Signed)
Patient notified

## 2021-09-06 ENCOUNTER — Other Ambulatory Visit: Payer: Medicare Other

## 2021-10-07 ENCOUNTER — Telehealth: Payer: Self-pay

## 2021-10-11 ENCOUNTER — Ambulatory Visit: Payer: Medicare Other | Admitting: Physician Assistant

## 2021-10-29 ENCOUNTER — Ambulatory Visit (INDEPENDENT_AMBULATORY_CARE_PROVIDER_SITE_OTHER): Payer: Medicare Other

## 2021-10-29 ENCOUNTER — Other Ambulatory Visit: Payer: Self-pay

## 2021-10-29 VITALS — Ht 60.0 in | Wt 137.0 lb

## 2021-10-29 DIAGNOSIS — Z122 Encounter for screening for malignant neoplasm of respiratory organs: Secondary | ICD-10-CM

## 2021-10-29 DIAGNOSIS — Z87891 Personal history of nicotine dependence: Secondary | ICD-10-CM

## 2021-10-29 DIAGNOSIS — J9611 Chronic respiratory failure with hypoxia: Secondary | ICD-10-CM

## 2021-10-29 DIAGNOSIS — Z780 Asymptomatic menopausal state: Secondary | ICD-10-CM | POA: Diagnosis not present

## 2021-10-29 DIAGNOSIS — M810 Age-related osteoporosis without current pathological fracture: Secondary | ICD-10-CM | POA: Diagnosis not present

## 2021-10-29 DIAGNOSIS — J438 Other emphysema: Secondary | ICD-10-CM

## 2021-10-29 DIAGNOSIS — Z0000 Encounter for general adult medical examination without abnormal findings: Secondary | ICD-10-CM | POA: Diagnosis not present

## 2021-10-29 NOTE — Progress Notes (Addendum)
Subjective:   Taylor Burnett is a 70 y.o. female who presents for an Initial Medicare Annual Wellness Visit. Virtual Visit via Telephone Note  I connected with  Taylor Burnett on 10/29/21 at  9:45 AM EDT by telephone and verified that I am speaking with the correct person using two identifiers.  Location: Patient: Home Provider: [facility] Persons participating in the virtual visit: patient/Nurse Health Advisor   I discussed the limitations, risks, security and privacy concerns of performing an evaluation and management service by telephone and the availability of in person appointments. The patient expressed understanding and agreed to proceed.  Interactive audio and video telecommunications were attempted between this nurse and patient, however failed, due to patient having technical difficulties OR patient did not have access to video capability.  We continued and completed visit with audio only.  Some vital signs may be absent or patient reported.   Darral Dash, LPN  Review of Systems     Cardiac Risk Factors include: advanced age (>1men, >27 women);sedentary lifestyle;dyslipidemia     Objective:    Today's Vitals   10/29/21 0947  Weight: 137 lb (62.1 kg)  Height: 5' (1.524 m)   Body mass index is 26.76 kg/m.  Advanced Directives 10/29/2021 03/25/2021 04/29/2019 12/31/2018 06/19/2017 05/16/2017 05/02/2017  Does Patient Have a Medical Advance Directive? No No No No No No No  Would patient like information on creating a medical advance directive? No - Patient declined No - Patient declined Yes (ED - Information included in AVS) Yes (MAU/Ambulatory/Procedural Areas - Information given) - No - Patient declined -  Pre-existing out of facility DNR order (yellow form or pink MOST form) - - - - - - -    Current Medications (verified) Outpatient Encounter Medications as of 10/29/2021  Medication Sig   albuterol (PROAIR HFA) 108 (90 Base) MCG/ACT inhaler INHALE 2 PUFFS INTO THE  LUNGS EVERY 6 HOURS AS NEEDED.   arformoterol (BROVANA) 15 MCG/2ML NEBU Take 2 mLs (15 mcg total) by nebulization daily.   aspirin EC 81 MG tablet Take 1 tablet (81 mg total) by mouth daily. Swallow whole.   atorvastatin (LIPITOR) 80 MG tablet Take 1 tablet (80 mg total) by mouth daily.   BREZTRI AEROSPHERE 160-9-4.8 MCG/ACT AERO INHALE (2) PUFFS INTO THE LUNGS IN THE MORNING AND AT BEDTIME   Cholecalciferol (VITAMIN D) 2000 units CAPS Take 2,000 Units by mouth daily.    diltiazem (CARDIZEM CD) 180 MG 24 hr capsule Take 1 capsule (180 mg total) by mouth daily.   fluticasone (FLONASE) 50 MCG/ACT nasal spray Place 2 sprays into both nostrils every evening.   gabapentin (NEURONTIN) 100 MG capsule Take 1 capsule (100 mg total) by mouth 3 (three) times daily.   Melatonin 10 MG TABS Take 10 mg by mouth at bedtime as needed (sleep).   naproxen sodium (ALEVE) 220 MG tablet Take 440 mg by mouth 2 (two) times daily as needed (pain).    OXYGEN Inhale 2 L into the lungs as needed. FOR USE WITH EXERTION   pantoprazole (PROTONIX) 40 MG tablet Take 1 tablet (40 mg total) by mouth daily.   Facility-Administered Encounter Medications as of 10/29/2021  Medication   denosumab (PROLIA) injection 60 mg    Allergies (verified) Prednisone and Toprol xl [metoprolol]   History: Past Medical History:  Diagnosis Date   Arthritis    CAD (coronary artery disease), native coronary artery    mild non obstructive CAD of the RCA, LCX, LAD  up to 25-49% by coronary CTA 11/2020   Cataract    COPD (chronic obstructive pulmonary disease) (HCC)    Cough    DDD (degenerative disc disease), cervical    Dysrhythmia    RAPID HEART BEAT OCC    Emphysema of lung (HCC)    GERD (gastroesophageal reflux disease)    OCC   H/O degenerative disc disease    Headache    Myocardial infarction (HCC) 2016   Osteoporosis    Panic attacks    PAT (paroxysmal atrial tachycardia) (HCC)    noted on event monitor 11/2020   Pre-diabetes     Severe lung disease    Shortness of breath dyspnea    Sleep apnea    Spondylosis    Past Surgical History:  Procedure Laterality Date   ANTERIOR CERVICAL DECOMP/DISCECTOMY FUSION N/A 03/13/2015   Procedure: CERVICAL THREE-CERVICAL FOUR, CERVICAL FOUR-CERVICAL FIVE, CERVICAL FIVE-CERVICAL SIX ANTERIOR CERVICAL DECOMPRESSION/DISCECTOMY FUSION 3 LEVELS;  Surgeon: Tia Alert, MD;  Location: [facility];  Service: Neurosurgery;  Laterality: N/A;   BIOPSY  03/25/2021   Procedure: BIOPSY;  Surgeon: Rachael Fee, MD;  Location: [facility];  Service: Endoscopy;;   BREAST ENHANCEMENT SURGERY  1990   CARDIAC CATHETERIZATION  1995   CATARACT EXTRACTION W/PHACO Right 05/02/2017   Procedure: CATARACT EXTRACTION PHACO AND INTRAOCULAR LENS PLACEMENT (IOC);  Surgeon: Jethro Bolus, MD;  Location: [facility];  Service: Ophthalmology;  Laterality: Right;  CDE: 6.22   CATARACT EXTRACTION W/PHACO Left 05/16/2017   Procedure: CATARACT EXTRACTION PHACO AND INTRAOCULAR LENS PLACEMENT (IOC);  Surgeon: Jethro Bolus, MD;  Location: [facility];  Service: Ophthalmology;  Laterality: Left;  CDE: 4.10   ESOPHAGOGASTRODUODENOSCOPY (EGD) WITH PROPOFOL N/A 03/25/2021   Procedure: ESOPHAGOGASTRODUODENOSCOPY (EGD) WITH PROPOFOL;  Surgeon: Rachael Fee, MD;  Location: [facility];  Service: Endoscopy;  Laterality: N/A;   TONSILLECTOMY     TUBAL LIGATION     VIDEO BRONCHOSCOPY Bilateral 12/31/2018   Procedure: VIDEO BRONCHOSCOPY WITHOUT FLUORO;  Surgeon: Leslye Peer, MD;  Location: [facility];  Service: Cardiopulmonary;  Laterality: Bilateral;   Family History  Problem Relation Age of Onset   Emphysema Father    Allergies Mother    Allergies Son    Allergies Brother    Heart disease Maternal Grandmother    Rheum arthritis Brother    Cancer Other        aunts and uncles   Social History   Socioeconomic History   Marital status: Divorced    Spouse name: Not on file   Number of children: 3   Years of  education: Not on file   Highest education level: Not on file  Occupational History   Occupation: on disability.  Tobacco Use   Smoking status: Former    Packs/day: 2.00    Years: 40.00    Pack years: 80.00    Types: Cigarettes    Quit date: 07/31/2013    Years since quitting: 8.2   Smokeless tobacco: Never   Tobacco comments:    Would like Lung Ca Screening, quit in 2014. 10/29/2021  Vaping Use   Vaping Use: Never used  Substance and Sexual Activity   Alcohol use: No    Comment: OCC WINE    Drug use: No   Sexual activity: Not on file  Other Topics Concern   Not on file  Social History Narrative   Divorced and lives alone.    2 sons and 1 daughter   6 grandchildren.  Social Determinants of Health   Financial Resource Strain: Not on file  Food Insecurity: No Food Insecurity   Worried About Programme researcher, broadcasting/film/video in the Last Year: Never true   Ran Out of Food in the Last Year: Never true  Transportation Needs: No Transportation Needs   Lack of Transportation (Medical): No   Lack of Transportation (Non-Medical): No  Physical Activity: Sufficiently Active   Days of Exercise per Week: 5 days   Minutes of Exercise per Session: 30 min  Stress: No Stress Concern Present   Feeling of Stress : Not at all  Social Connections: Moderately Integrated   Frequency of Communication with Friends and Family: More than three times a week   Frequency of Social Gatherings with Friends and Family: More than three times a week   Attends Religious Services: 1 to 4 times per year   Active Member of [employer] or Organizations: No   Attends Banker Meetings: 1 to 4 times per year   Marital Status: Widowed    Tobacco Counseling Counseling given: Not Answered Tobacco comments: Would like Lung Ca Screening, quit in 2014. 10/29/2021   Clinical Intake:  Pre-visit preparation completed: Yes  Pain : No/denies pain     BMI - recorded: 26.76 Nutritional Status: BMI 25 -29  Overweight Nutritional Risks: None Diabetes: No (Pre-diabetes.)  How often do you need to have someone help you when you read instructions, pamphlets, or other written materials from your doctor or pharmacy?: 1 - Never  Diabetic?no  Interpreter Needed?: No  Information entered by :: MJ Kylle Lall, LPN   Activities of Daily Living In your present state of health, do you have any difficulty performing the following activities: 10/29/2021  Hearing? N  Vision? N  Difficulty concentrating or making decisions? N  Walking or climbing stairs? N  Dressing or bathing? N  Doing errands, shopping? N  Preparing Food and eating ? N  Using the Toilet? N  In the past six months, have you accidently leaked urine? N  Do you have problems with loss of bowel control? N  Managing your Medications? N  Managing your Finances? N  Housekeeping or managing your Housekeeping? N  Some recent data might be hidden    Patient Care Team: Donita Brooks, MD as PCP - General (Family Medicine) Quintella Reichert, MD as PCP - Cardiology (Cardiology) Erroll Luna, [facility] (Pharmacist)  Indicate any recent Medical Services you may have received from other than [facility] providers in the past year (date may be approximate).     Assessment:   This is a routine wellness examination for [place].  Hearing/Vision screen Hearing Screening - Comments:: No hearing issues.  Vision Screening - Comments:: Dr. Nile Riggs, cataract surgery 2019. No glasses since. 2022, last exam.   Dietary issues and exercise activities discussed: Current Exercise Habits: Home exercise routine, Type of exercise: walking, Time (Minutes): 25, Frequency (Times/Week): 5, Weekly Exercise (Minutes/Week): 125, Intensity: Mild, Exercise limited by: cardiac condition(s);respiratory conditions(s)   Goals Addressed             This Visit's Progress    Pharmacy Care Plan:   On track    CARE PLAN ENTRY  Current Barriers:  Chronic Disease Management  support, education, and care coordination needs related to GERD and COPD  GERD Pharmacist Clinical Goal(s) Over the next 120 days, patient will work with PharmD and providers to optimize medication and symptoms related to GERD Current regimen:  Nexium 40mg  daily Interventions: Counseled  on appropriate dosing and administration Discussed avoidance of trigger foods Patient self care activities - Over the next 120 days, patient will: Contact PharmD or PCP with increased symptoms Work on trigger food elimination  COPD Pharmacist Clinical Goal(s) Over the next 120 days, patient will work with PharmD and providers to optimize medication and minimize symptoms related to COPD. Current regimen:  Proair HFA  ipratropium .02% nebulizer Brovana 60mcg/2ml neb Breztri 160-9-4.7mcg Interventions: Comprehensive medication review Discussed frequency of rescue inhaler use Patient self care activities - Over the next 180 days, patient will: Continue to take medication as directed. Contact PharmD or PCP with any medication related concerns or increase in symptoms related to COPD.  Please see past updates related to this goal by clicking on the "Past Updates" button in the selected goal       Track and Manage Heart Rate and Rhythm-Atrial Fibrillation   On track    Timeframe:  Long-Range Goal Priority:  High Start Date:  04/05/21                           Expected End Date:     10/25/21                  Follow Up Date 06/31/22   - check pulse (heart) rate before taking medicine - check pulse (heart) rate once a day - make a plan to eat healthy - take medicine as prescribed    Why is this important?   Atrial fibrillation may have no symptoms. Sometimes the symptoms get worse or happen more often.  It is important to keep track of what your symptoms are and when they happen.  A change in symptoms is important to discuss with your doctor or nurse.  Being active and healthy eating will  also help you manage your heart condition.     Notes:        Depression Screen PHQ 2/9 Scores 10/29/2021 07/20/2021 06/19/2017 01/23/2017 07/21/2016 06/06/2016 05/19/2015  PHQ - 2 Score 0 3 0 0 0 1 0  PHQ- 9 Score - 10 - - - - -    Fall Risk Fall Risk  10/29/2021 07/20/2021 06/19/2017 01/23/2017 07/21/2016  Falls in the past year? 0 0 No No No  Number falls in past yr: 0 0 - - -  Injury with Fall? 0 0 - - -  Risk for fall due to : Impaired mobility;Other (Comment);Impaired balance/gait No Fall Risks - - -  Risk for fall due to: Comment COPD - - - -  Follow up Falls prevention discussed Falls evaluation completed - - -    FALL RISK PREVENTION PERTAINING TO THE HOME:  Any stairs in or around the home? No  If so, are there any without handrails? No  Home free of loose throw rugs in walkways, pet beds, electrical cords, etc? Yes  Adequate lighting in your home to reduce risk of falls? Yes   ASSISTIVE DEVICES UTILIZED TO PREVENT FALLS:  Life alert? No  Use of a cane, walker or w/c? No  Grab bars in the bathroom? No  Shower chair or bench in shower? Yes  Elevated toilet seat or a handicapped toilet? No   TIMED UP AND GO:  Was the test performed? No . Phone visit.   Cognitive Function:     6CIT Screen 10/29/2021  What Year? 0 points  What month? 0 points  What time? 0 points  Count  back from 20 0 points  Months in reverse 0 points  Repeat phrase 0 points  Total Score 0    Immunizations Immunization History  Administered Date(s) Administered   PFIZER Comirnaty(Gray Top)Covid-19 Tri-Sucrose Vaccine 01/28/2021   PFIZER(Purple Top)SARS-COV-2 Vaccination 01/07/2021   Pneumococcal Conjugate-13 07/30/2019   Pneumococcal Polysaccharide-23 04/20/2018   Tdap 06/06/2016    TDAP status: Up to date  Flu Vaccine status: Declined, Education has been provided regarding the importance of this vaccine but patient still declined. Advised may receive this vaccine at local pharmacy or Health  Dept. Aware to provide a copy of the vaccination record if obtained from local pharmacy or Health Dept. Verbalized acceptance and understanding.  Pneumococcal vaccine status: Up to date  Covid-19 vaccine status: Information provided on how to obtain vaccines.   Qualifies for Shingles Vaccine? Yes   Zostavax completed No   Shingrix Completed?: No.    Education has been provided regarding the importance of this vaccine. Patient has been advised to call insurance company to determine out of pocket expense if they have not yet received this vaccine. Advised may also receive vaccine at local pharmacy or Health Dept. Verbalized acceptance and understanding.  Screening Tests Health Maintenance  Topic Date Due   Zoster Vaccines- Shingrix (1 of 2) Never done   MAMMOGRAM  06/14/2020   COVID-19 Vaccine (3 - Booster for Pfizer series) 03/25/2021   INFLUENZA VACCINE  Never done   Fecal DNA (Cologuard)  07/15/2024   TETANUS/TDAP  06/06/2026   Pneumonia Vaccine 90+ Years old  Completed   DEXA SCAN  Completed   Hepatitis C Screening  Completed   HPV VACCINES  Aged Out    Health Maintenance  Health Maintenance Due  Topic Date Due   Zoster Vaccines- Shingrix (1 of 2) Never done   MAMMOGRAM  06/14/2020   COVID-19 Vaccine (3 - Booster for Pfizer series) 03/25/2021   INFLUENZA VACCINE  Never done    Colorectal cancer screening: Type of screening: Cologuard. Completed 08/10/2021. Repeat every 3 years  Mammogram status: Completed 06/14/2018. Repeat every year  Bone Density status: Ordered 10/29/2021. Pt provided with contact info and advised to call to schedule appt.  Lung Cancer Screening: (Low Dose CT Chest recommended if Age 76-80 years, 30 pack-year currently smoking OR have quit w/in 15years.) does qualify.   Lung Cancer Screening Referral: Order placed today.   Additional Screening:  Hepatitis C Screening: does qualify; Completed 06/06/2016  Vision Screening: Recommended annual  ophthalmology exams for early detection of glaucoma and other disorders of the eye. Is the patient up to date with their annual eye exam?  Yes  Who is the provider or what is the name of the office in which the patient attends annual eye exams? Dr. Nile Riggs If pt is not established with a provider, would they like to be referred to a provider to establish care? No .   Dental Screening: Recommended annual dental exams for proper oral hygiene  Community Resource Referral / Chronic Care Management: CRR required this visit?  No   CCM required this visit?  Yes      Plan:     I have personally reviewed and noted the following in the patient's chart:   Medical and social history Use of alcohol, tobacco or illicit drugs  Current medications and supplements including opioid prescriptions. Patient is not currently taking opioid prescriptions. Functional ability and status Nutritional status Physical activity Advanced directives List of other physicians Hospitalizations, surgeries, and ER visits in  previous 12 months Vitals Screenings to include cognitive, depression, and falls Referrals and appointments  In addition, I have reviewed and discussed with patient certain preventive protocols, quality metrics, and best practice recommendations. A written personalized care plan for preventive services as well as general preventive health recommendations were provided to patient.     Darral Dash, LPN   51/06/16   Nurse Notes: CCM referral placed to assist patient with oxygen supplier. Pt states she is currently with [employer] and the equipment doesn't work a lot of the time. Lung Ca screen ordered. Pt is agreeable. Dexa ordered. Pt is agreeable. Pt states she would like to wait until next June for Mammogram.     I have collaborated with the care management provider regarding care management and care coordination activities outlined in this encounter and have reviewed this encounter  including documentation in the note and care plan. I am certifying that I agree with the content of this note and encounter as supervising physician. I have collaborated with the care management provider regarding care management and care coordination activities outlined in this encounter and have reviewed this encounter including documentation in the note and care plan. I am certifying that I agree with the content of this note and encounter as supervising physician.

## 2021-10-29 NOTE — Patient Instructions (Signed)
Taylor Burnett , Thank you for taking time to come for your Medicare Wellness Visit. I appreciate your ongoing commitment to your health goals. Please review the following plan we discussed and let me know if I can assist you in the future.   Screening recommendations/referrals: Colonoscopy: Cologuard done 08/10/21, repeat in 2025. Mammogram: Done 06/14/2018 Repeat annually  Bone Density: Done 06/14/2018, order placed to repeat. Recommended yearly ophthalmology/optometry visit for glaucoma screening and checkup Recommended yearly dental visit for hygiene and checkup  Vaccinations: Influenza vaccine: Declined Pneumococcal vaccine: Done 04/20/2018 and 07/30/2019 Tdap vaccine: Done 06/06/2016 Repeat in 10 years  Shingles vaccine: Shingrix discussed. Please contact your pharmacy for coverage information.     Covid-19:Done 01/07/2021 and 01/28/2021  Advanced directives: Advance directive discussed with you today. Even though you declined this today, please call our office should you change your mind, and we can give you the proper paperwork for you to fill out.   Conditions/risks identified: Aim for 30 minutes of exercise or walking each day, drink 6-8 glasses of water and eat lots of fruits and vegetables.   Next appointment: Follow up in one year for your annual wellness visit 2023   Preventive Care 65 Years and Older, Female Preventive care refers to lifestyle choices and visits with your health care provider that can promote health and wellness. What does preventive care include? A yearly physical exam. This is also called an annual well check. Dental exams once or twice a year. Routine eye exams. Ask your health care provider how often you should have your eyes checked. Personal lifestyle choices, including: Daily care of your teeth and gums. Regular physical activity. Eating a healthy diet. Avoiding tobacco and drug use. Limiting alcohol use. Practicing safe sex. Taking low-dose aspirin  every day. Taking vitamin and mineral supplements as recommended by your health care provider. What happens during an annual well check? The services and screenings done by your health care provider during your annual well check will depend on your age, overall health, lifestyle risk factors, and family history of disease. Counseling  Your health care provider may ask you questions about your: Alcohol use. Tobacco use. Drug use. Emotional well-being. Home and relationship well-being. Sexual activity. Eating habits. History of falls. Memory and ability to understand (cognition). Work and work Astronomer. Reproductive health. Screening  You may have the following tests or measurements: Height, weight, and BMI. Blood pressure. Lipid and cholesterol levels. These may be checked every 5 years, or more frequently if you are over 57 years old. Skin check. Lung cancer screening. You may have this screening every year starting at age 60 if you have a 30-pack-year history of smoking and currently smoke or have quit within the past 15 years. Fecal occult blood test (FOBT) of the stool. You may have this test every year starting at age 26. Flexible sigmoidoscopy or colonoscopy. You may have a sigmoidoscopy every 5 years or a colonoscopy every 10 years starting at age 18. Hepatitis C blood test. Hepatitis B blood test. Sexually transmitted disease (STD) testing. Diabetes screening. This is done by checking your blood sugar (glucose) after you have not eaten for a while (fasting). You may have this done every 1-3 years. Bone density scan. This is done to screen for osteoporosis. You may have this done starting at age 66. Mammogram. This may be done every 1-2 years. Talk to your health care provider about how often you should have regular mammograms. Talk with your health care provider about your  test results, treatment options, and if necessary, the need for more tests. Vaccines  Your health  care provider may recommend certain vaccines, such as: Influenza vaccine. This is recommended every year. Tetanus, diphtheria, and acellular pertussis (Tdap, Td) vaccine. You may need a Td booster every 10 years. Zoster vaccine. You may need this after age 17. Pneumococcal 13-valent conjugate (PCV13) vaccine. One dose is recommended after age 49. Pneumococcal polysaccharide (PPSV23) vaccine. One dose is recommended after age 63. Talk to your health care provider about which screenings and vaccines you need and how often you need them. This information is not intended to replace advice given to you by your health care provider. Make sure you discuss any questions you have with your health care provider. Document Released: 01/08/2016 Document Revised: 08/31/2016 Document Reviewed: 10/13/2015 Elsevier Interactive Patient Education  2017 [employer].  Fall Prevention in the Home Falls can cause injuries. They can happen to people of all ages. There are many things you can do to make your home safe and to help prevent falls. What can I do on the outside of my home? Regularly fix the edges of walkways and driveways and fix any cracks. Remove anything that might make you trip as you walk through a door, such as a raised step or threshold. Trim any bushes or trees on the path to your home. Use bright outdoor lighting. Clear any walking paths of anything that might make someone trip, such as rocks or tools. Regularly check to see if handrails are loose or broken. Make sure that both sides of any steps have handrails. Any raised decks and porches should have guardrails on the edges. Have any leaves, snow, or ice cleared regularly. Use sand or salt on walking paths during winter. Clean up any spills in your garage right away. This includes oil or grease spills. What can I do in the bathroom? Use night lights. Install grab bars by the toilet and in the tub and shower. Do not use towel bars as grab  bars. Use non-skid mats or decals in the tub or shower. If you need to sit down in the shower, use a plastic, non-slip stool. Keep the floor dry. Clean up any water that spills on the floor as soon as it happens. Remove soap buildup in the tub or shower regularly. Attach bath mats securely with double-sided non-slip rug tape. Do not have throw rugs and other things on the floor that can make you trip. What can I do in the bedroom? Use night lights. Make sure that you have a light by your bed that is easy to reach. Do not use any sheets or blankets that are too big for your bed. They should not hang down onto the floor. Have a firm chair that has side arms. You can use this for support while you get dressed. Do not have throw rugs and other things on the floor that can make you trip. What can I do in the kitchen? Clean up any spills right away. Avoid walking on wet floors. Keep items that you use a lot in easy-to-reach places. If you need to reach something above you, use a strong step stool that has a grab bar. Keep electrical cords out of the way. Do not use floor polish or wax that makes floors slippery. If you must use wax, use non-skid floor wax. Do not have throw rugs and other things on the floor that can make you trip. What can I do with my  stairs? Do not leave any items on the stairs. Make sure that there are handrails on both sides of the stairs and use them. Fix handrails that are broken or loose. Make sure that handrails are as long as the stairways. Check any carpeting to make sure that it is firmly attached to the stairs. Fix any carpet that is loose or worn. Avoid having throw rugs at the top or bottom of the stairs. If you do have throw rugs, attach them to the floor with carpet tape. Make sure that you have a light switch at the top of the stairs and the bottom of the stairs. If you do not have them, ask someone to add them for you. What else can I do to help prevent  falls? Wear shoes that: Do not have high heels. Have rubber bottoms. Are comfortable and fit you well. Are closed at the toe. Do not wear sandals. If you use a stepladder: Make sure that it is fully opened. Do not climb a closed stepladder. Make sure that both sides of the stepladder are locked into place. Ask someone to hold it for you, if possible. Clearly mark and make sure that you can see: Any grab bars or handrails. First and last steps. Where the edge of each step is. Use tools that help you move around (mobility aids) if they are needed. These include: Canes. Walkers. Scooters. Crutches. Turn on the lights when you go into a dark area. Replace any light bulbs as soon as they burn out. Set up your furniture so you have a clear path. Avoid moving your furniture around. If any of your floors are uneven, fix them. If there are any pets around you, be aware of where they are. Review your medicines with your doctor. Some medicines can make you feel dizzy. This can increase your chance of falling. Ask your doctor what other things that you can do to help prevent falls. This information is not intended to replace advice given to you by your health care provider. Make sure you discuss any questions you have with your health care provider. Document Released: 10/08/2009 Document Revised: 05/19/2016 Document Reviewed: 01/16/2015 Elsevier Interactive Patient Education  2017 [employer].

## 2021-11-02 ENCOUNTER — Telehealth: Payer: Self-pay | Admitting: *Deleted

## 2021-11-02 NOTE — Chronic Care Management (AMB) (Signed)
  Chronic Care Management   Note  11/02/2021 Name: BAELEIGH DEVINCENT MRN: [id number] DOB: 03/23/1951  JANNELLY BERGREN is a 70 y.o. year old female who is a primary care patient of Dennard Schaumann, Cammie Mcgee, MD. I reached out to Vevelyn Pat by phone today in response to a referral sent by Ms. Remington PCP.  Ms. Panik was given information about Chronic Care Management services today including:  CCM service includes personalized support from designated clinical staff supervised by her physician, including individualized plan of care and coordination with other care providers 24/7 contact phone numbers for assistance for urgent and routine care needs. Service will only be billed when office clinical staff spend 20 minutes or more in a month to coordinate care. Only one practitioner may furnish and bill the service in a calendar month. The patient may stop CCM services at any time (effective at the end of the month) by phone call to the office staff. The patient is responsible for co-pay (up to 20% after annual deductible is met) if co-pay is required by the individual health plan.   Patient agreed to services and verbal consent obtained.   Follow up plan: Telephone appointment with care management team member scheduled for:11/05/21  Will Management  Direct Dial: [phone]

## 2021-11-05 ENCOUNTER — Ambulatory Visit (INDEPENDENT_AMBULATORY_CARE_PROVIDER_SITE_OTHER): Payer: Medicare Other | Admitting: *Deleted

## 2021-11-05 DIAGNOSIS — J438 Other emphysema: Secondary | ICD-10-CM

## 2021-11-05 DIAGNOSIS — I2510 Atherosclerotic heart disease of native coronary artery without angina pectoris: Secondary | ICD-10-CM

## 2021-11-05 NOTE — Patient Instructions (Signed)
Visit Information   PATIENT GOALS/PLAN OF CARE:  Care Plan : RN Care Manager plan of care  Updates made by Kassie Mends, RN since 11/05/2021 12:00 AM     Problem: No plan of care established for management of chronic disease states (COPD, CAD)   Priority: High     Long-Range Goal: Development of plan of care for chronic disease management (COPD, CAD)   Priority: High  Note:   Current Barriers:  Knowledge Deficits related to plan of care for management of CAD and COPD  Chronic Disease Management support and education needs related to CAD and COPD No Advanced Directives in place- pt declines at present Patient reports she lives with her older brother, has a daughter she depends on for errands, etc.  Patient needs reinforcement of COPD action plan as she cannot verbalize the plan.  Pt reports she will consider getting the flu vaccine next week when she sees Dr. Lamonte Sakai.  Pt reports she tries to eat healthy, walks some if she is able, becomes dyspneic easily.  RNCM Clinical Goal(s):  Patient will verbalize understanding of plan for management of CAD and COPD as evidenced by review of EHR, collaboration with care team and patient report take all medications exactly as prescribed and will call provider for medication related questions as evidenced by pt review, EHR review.    attend all scheduled medical appointments:  as evidenced by pt report, EHR review, through collaboration with RN Care manager, provider, and care team.   Interventions: 1:1 collaboration with primary care provider regarding development and update of comprehensive plan of care as evidenced by provider attestation and co-signature Inter-disciplinary care team collaboration (see longitudinal plan of care) Evaluation of current treatment plan related to  self management and patient's adherence to plan as established by provider  COPD Interventions:   Provided patient with basic written and verbal COPD education on self  care/management/and exacerbation prevention; Provided instruction about proper use of medications used for management of COPD including inhalers; Advised patient to self assesses COPD action plan zone and make appointment with provider if in the yellow zone for 48 hours without improvement; Advised patient to engage in light exercise as tolerated 3-5 days a week to aid in the the management of COPD; Provided education about and advised patient to utilize infection prevention strategies to reduce risk of respiratory infection; Discussed the importance of adequate rest and management of fatigue with COPD; Screening for signs and symptoms of depression related to chronic disease state;  Assessed social determinant of health barriers;  Provided education via My Chart- COPD action plan Pain assessment completed Encouraged patient to get flu vaccine next week at pulmonary visit on 11/12/21  CAD Interventions:  Assessed understanding of CAD diagnosis Medications reviewed including medications utilized in CAD treatment plan Provided education on importance of blood pressure control in management of CAD; Provided education on Importance of limiting foods high in cholesterol; Counseled on importance of regular laboratory monitoring as prescribed; Reviewed Importance of taking all medications as prescribed  Reviewed importance of adherence to heart healthy diet Provided education via My Chart- heart healthy diet  Patient Goals/Self-Care Activities: Patient will self administer medications as prescribed as evidenced by self report/primary caregiver report  Patient will attend all scheduled provider appointments as evidenced by clinician review of documented attendance to scheduled appointments and patient/caregiver report Patient will continue to perform ADL's independently as evidenced by patient/caregiver report Patient will continue to perform IADL's independently as evidenced by patient/caregiver  report Patient will call provider office for new concerns or questions as evidenced by review of documented incoming telephone call notes and patient report Look over education sent via My Chart- COPD action plan, heart healthy diet Talk to doctor next week about getting flu vaccine Call your doctor early on for change in health status, symptoms Try to do light exercise as tolerated   Follow Up Plan:  Telephone follow up appointment with care management team member scheduled for:  12/13/2021       Consent to CCM Services: Ms. Hadley was given information about Chronic Care Management services including:  CCM service includes personalized support from designated clinical staff supervised by her physician, including individualized plan of care and coordination with other care providers 24/7 contact phone numbers for assistance for urgent and routine care needs. Service will only be billed when office clinical staff spend 20 minutes or more in a month to coordinate care. Only one practitioner may furnish and bill the service in a calendar month. The patient may stop CCM services at any time (effective at the end of the month) by phone call to the office staff. The patient will be responsible for cost sharing (co-pay) of up to 20% of the service fee (after annual deductible is met).  Patient agreed to services and verbal consent obtained.   Patient verbalizes understanding of instructions provided today and agrees to view in [place].   Telephone follow up appointment with care management team member scheduled for:   12/13/2021  Heart-Healthy Eating Plan Heart-healthy meal planning includes: Eating less unhealthy fats. Eating more healthy fats. Making other changes in your diet. Talk with your doctor or a diet specialist (dietitian) to create an eating plan that is right for you. What is my plan? Your doctor may recommend an eating plan that includes: Total fat: ______% or less of total  calories a day. Saturated fat: ______% or less of total calories a day. Cholesterol: less than _________mg a day. What are tips for following this plan? Cooking Avoid frying your food. Try to bake, boil, grill, or broil it instead. You can also reduce fat by: Removing the skin from poultry. Removing all visible fats from meats. Steaming vegetables in water or broth. Meal planning  At meals, divide your plate into four equal parts: Fill one-half of your plate with vegetables and green salads. Fill one-fourth of your plate with whole grains. Fill one-fourth of your plate with lean protein foods. Eat 4-5 servings of vegetables per day. A serving of vegetables is: 1 cup of raw or cooked vegetables. 2 cups of raw leafy greens. Eat 4-5 servings of fruit per day. A serving of fruit is: 1 medium whole fruit.  cup of dried fruit.  cup of fresh, frozen, or canned fruit.  cup of 100% fruit juice. Eat more foods that have soluble fiber. These are apples, broccoli, carrots, beans, peas, and barley. Try to get 20-30 g of fiber per day. Eat 4-5 servings of nuts, legumes, and seeds per week: 1 serving of dried beans or legumes equals  cup after being cooked. 1 serving of nuts is  cup. 1 serving of seeds equals 1 tablespoon. General information Eat more home-cooked food. Eat less restaurant, buffet, and fast food. Limit or avoid alcohol. Limit foods that are high in starch and sugar. Avoid fried foods. Lose weight if you are overweight. Keep track of how much salt (sodium) you eat. This is important if you have high blood pressure. Ask your  doctor to tell you more about this. Try to add vegetarian meals each week. Fats Choose healthy fats. These include olive oil and canola oil, flaxseeds, walnuts, almonds, and seeds. Eat more omega-3 fats. These include salmon, mackerel, sardines, tuna, flaxseed oil, and ground flaxseeds. Try to eat fish at least 2 times each week. Check food labels.  Avoid foods with trans fats or high amounts of saturated fat. Limit saturated fats. These are often found in animal products, such as meats, butter, and cream. These are also found in plant foods, such as palm oil, palm kernel oil, and coconut oil. Avoid foods with partially hydrogenated oils in them. These have trans fats. Examples are stick margarine, some tub margarines, cookies, crackers, and other baked goods. What foods can I eat? Fruits All fresh, canned (in natural juice), or frozen fruits. Vegetables Fresh or frozen vegetables (raw, steamed, roasted, or grilled). Green salads. Grains Most grains. Choose whole wheat and whole grains most of the time. Rice and pasta, including brown rice and pastas made with whole wheat. Meats and other proteins Lean, well-trimmed beef, veal, pork, and lamb. Chicken and [place] without skin. All fish and shellfish. Wild duck, rabbit, pheasant, and venison. Egg whites or low-cholesterol egg substitutes. Dried beans, peas, lentils, and tofu. Seeds and most nuts. Dairy Low-fat or nonfat cheeses, including ricotta and mozzarella. Skim or 1% milk that is liquid, powdered, or evaporated. Buttermilk that is made with low-fat milk. Nonfat or low-fat yogurt. Fats and oils Non-hydrogenated (trans-free) margarines. Vegetable oils, including soybean, sesame, sunflower, olive, peanut, safflower, corn, canola, and cottonseed. Salad dressings or mayonnaise made with a vegetable oil. Beverages Mineral water. Coffee and tea. Diet carbonated beverages. Sweets and desserts Sherbet, gelatin, and fruit ice. Small amounts of dark chocolate. Limit all sweets and desserts. Seasonings and condiments All seasonings and condiments. The items listed above may not be a complete list of foods and drinks you can eat. Contact a dietitian for more options. What foods should I avoid? Fruits Canned fruit in heavy syrup. Fruit in cream or butter sauce. Fried fruit. Limit  coconut. Vegetables Vegetables cooked in cheese, cream, or butter sauce. Fried vegetables. Grains Breads that are made with saturated or trans fats, oils, or whole milk. Croissants. Sweet rolls. Donuts. High-fat crackers, such as cheese crackers. Meats and other proteins Fatty meats, such as hot dogs, ribs, sausage, bacon, rib-eye roast or steak. High-fat deli meats, such as salami and bologna. Caviar. Domestic duck and goose. Organ meats, such as liver. Dairy Cream, sour cream, cream cheese, and creamed cottage cheese. Whole-milk cheeses. Whole or 2% milk that is liquid, evaporated, or condensed. Whole buttermilk. Cream sauce or high-fat cheese sauce. Yogurt that is made from whole milk. Fats and oils Meat fat, or shortening. Cocoa butter, hydrogenated oils, palm oil, coconut oil, palm kernel oil. Solid fats and shortenings, including bacon fat, salt pork, lard, and butter. Nondairy cream substitutes. Salad dressings with cheese or sour cream. Beverages Regular sodas and juice drinks with added sugar. Sweets and desserts Frosting. Pudding. Cookies. Cakes. Pies. Milk chocolate or white chocolate. Buttered syrups. Full-fat ice cream or ice cream drinks. The items listed above may not be a complete list of foods and drinks to avoid. Contact a dietitian for more information. Summary Heart-healthy meal planning includes eating less unhealthy fats, eating more healthy fats, and making other changes in your diet. Eat a balanced diet. This includes fruits and vegetables, low-fat or nonfat dairy, lean protein, nuts and legumes, whole grains, and  heart-healthy oils and fats. This information is not intended to replace advice given to you by your health care provider. Make sure you discuss any questions you have with your health care provider. Document Revised: 04/22/2021 Document Reviewed: 04/22/2021 Elsevier Patient Education  2022 [place]. COPD Action Plan A COPD action plan is a description of  what to do when you have a flare (exacerbation) of chronic obstructive pulmonary disease (COPD). Your action plan is a color-coded plan that lists the symptoms that indicate whether your condition is under control and what actions to take. If you have symptoms in the green zone, it means you are doing well that day. If you have symptoms in the yellow zone, it means you are having a bad day or an exacerbation. If you have symptoms in the red zone, you need urgent medical care. Follow the plan that you and your health care provider developed. Review your plan with your health care provider at each visit. Red zone Symptoms in this zone mean that you should get medical help right away. They include: Feeling very short of breath, even when you are resting. Not being able to do any activities because of poor breathing. Not being able to sleep because of poor breathing. Fever or shaking chills. Feeling confused or very sleepy. Chest pain. Coughing up blood. If you have any of these symptoms, call emergency services (911 in the [place] or go to the nearest emergency room. Yellow zone Symptoms in this zone mean that your condition may be getting worse. They include: Feeling more short of breath than usual. Having less energy for daily activities than usual. Phlegm or mucus that is thicker than usual. Needing to use your rescue inhaler or nebulizer more often than usual. More ankle swelling than usual. Coughing more than usual. Feeling like you have a chest cold. Trouble sleeping due to COPD symptoms. Decreased appetite. COPD medicines not helping as much as usual. If you experience any "yellow" symptoms: Keep taking your daily medicines as directed. Use your quick-relief inhaler as told by your health care provider. If you were prescribed steroid medicine to take by mouth (oral medicine), start taking it as told by your health care provider. If you were prescribed an antibiotic medicine, start  taking it as told by your health care provider. Do not stop taking the antibiotic even if you start to feel better. Use oxygen as told by your health care provider. Get more rest. Do your pursed-lip breathing exercises. Do not smoke. Avoid any irritants in the air. If your signs and symptoms do not improve after taking these steps, call your health care provider right away. Green zone Symptoms in this zone mean that you are doing well. They include: Being able to do your usual activities and exercise. Having the usual amount of coughing, including the same amount of phlegm or mucus. Being able to sleep well. Having a good appetite. Where to find more information: You can find more information about COPD from: American Lung Association, My COPD Action Plan: www.lung.org COPD Foundation: www.copdfoundation.[place]: https://wilson-eaton.com/ Follow these instructions at home: Continue taking your daily medicines as told by your health care provider. Make sure you receive all the immunizations that your health care provider recommends, especially the pneumococcal and influenza vaccines. Wash your hands often with soap and water. Have family members wash their hands too. Regular hand washing can help prevent infections. Follow your usual exercise and diet plan. Avoid irritants in the  air, such as smoke. Do not use any products that contain nicotine or tobacco. These products include cigarettes, chewing tobacco, and vaping devices, such as e-cigarettes. If you need help quitting, ask your health care provider. Summary A COPD action plan tells you what to do when you have a flare (exacerbation) of chronic obstructive pulmonary disease (COPD). Follow each action plan for your symptoms. If you have any symptoms in the red zone, call emergency services (911 in the [place] or go to the nearest emergency room. This information is not intended to replace advice given to you by  your health care provider. Make sure you discuss any questions you have with your health care provider. Document Revised: 10/20/2020 Document Reviewed: 10/20/2020 Elsevier Patient Education  2022 [place]   Jacqlyn Larsen [facility], BSN RN Case Manager [facility] [phone]

## 2021-11-05 NOTE — Chronic Care Management (AMB) (Signed)
Chronic Care Management   CCM RN Visit Note  11/05/2021 Name: Taylor Burnett MRN: [id number] DOB: 1951/05/10  Subjective: Taylor Burnett is a 70 y.o. year old female who is a primary care patient of Pickard, Cammie Mcgee, MD. The care management team was consulted for assistance with disease management and care coordination needs.    Engaged with patient by telephone for initial visit in response to provider referral for case management and/or care coordination services.   Consent to Services:  The patient was given the following information about Chronic Care Management services today, agreed to services, and gave verbal consent: 1. CCM service includes personalized support from designated clinical staff supervised by the primary care provider, including individualized plan of care and coordination with other care providers 2. 24/7 contact phone numbers for assistance for urgent and routine care needs. 3. Service will only be billed when office clinical staff spend 20 minutes or more in a month to coordinate care. 4. Only one practitioner may furnish and bill the service in a calendar month. 5.The patient may stop CCM services at any time (effective at the end of the month) by phone call to the office staff. 6. The patient will be responsible for cost sharing (co-pay) of up to 20% of the service fee (after annual deductible is met). Patient agreed to services and consent obtained.  Patient agreed to services and verbal consent obtained.   Assessment: Review of patient past medical history, allergies, medications, health status, including review of consultants reports, laboratory and other test data, was performed as part of comprehensive evaluation and provision of chronic care management services.   SDOH (Social Determinants of Health) assessments and interventions performed:  SDOH Interventions    Flowsheet Row Most Recent Value  SDOH Interventions   Food Insecurity Interventions  Intervention Not Indicated  Transportation Interventions Intervention Not Indicated        CCM Care Plan  Allergies  Allergen Reactions   Prednisone Other (See Comments)    Depression and suicidal   Toprol Xl [Metoprolol] Other (See Comments)    Fatigue    Outpatient Encounter Medications as of 11/05/2021  Medication Sig   albuterol (PROAIR HFA) 108 (90 Base) MCG/ACT inhaler INHALE 2 PUFFS INTO THE LUNGS EVERY 6 HOURS AS NEEDED.   arformoterol (BROVANA) 15 MCG/2ML NEBU Take 2 mLs (15 mcg total) by nebulization daily.   aspirin EC 81 MG tablet Take 1 tablet (81 mg total) by mouth daily. Swallow whole.   atorvastatin (LIPITOR) 80 MG tablet Take 1 tablet (80 mg total) by mouth daily.   BREZTRI AEROSPHERE 160-9-4.8 MCG/ACT AERO INHALE (2) PUFFS INTO THE LUNGS IN THE MORNING AND AT BEDTIME   Cholecalciferol (VITAMIN D) 2000 units CAPS Take 2,000 Units by mouth daily.    diltiazem (CARDIZEM CD) 180 MG 24 hr capsule Take 1 capsule (180 mg total) by mouth daily.   fluticasone (FLONASE) 50 MCG/ACT nasal spray Place 2 sprays into both nostrils every evening.   gabapentin (NEURONTIN) 100 MG capsule Take 1 capsule (100 mg total) by mouth 3 (three) times daily.   Melatonin 10 MG TABS Take 10 mg by mouth at bedtime as needed (sleep).   naproxen sodium (ALEVE) 220 MG tablet Take 440 mg by mouth 2 (two) times daily as needed (pain).    OXYGEN Inhale 2 L into the lungs as needed. FOR USE WITH EXERTION   pantoprazole (PROTONIX) 40 MG tablet Take 1 tablet (40 mg total) by mouth daily.  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/2019   Chronic cough 10/22/2018   GERD (gastroesophageal reflux disease) 07/20/2018   Anxiety 06/21/2017    Prediabetes 06/21/2017   Chronic respiratory failure ([place]) 04/18/2017   Other insomnia 01/23/2017   COPD (chronic obstructive pulmonary disease) with emphysema ([place]) 01/16/2017   Sleep related hypoxia 01/16/2017   Sleep apnea in adult 07/11/2016   Chronic rhinitis 01/05/2016   S/P cervical spinal fusion 03/13/2015   Hemoptysis 01/01/2014   Atypical chest pain 06/08/2012   Neutropenia 12/23/2011   Thrombocytopenia ([place]) 12/22/2011   Hypoxemia 12/19/2011    Conditions to be addressed/monitored:CAD and COPD  Care Plan : RN Care Manager plan of care  Updates made by Kassie Mends, RN since 11/05/2021 12:00 AM     Problem: No plan of care established for management of chronic disease states (COPD, CAD)   Priority: High     Long-Range Goal: Development of plan of care for chronic disease management (COPD, CAD)   Priority: High  Note:   Current Barriers:  Knowledge Deficits related to plan of care for management of CAD and COPD  Chronic Disease Management support and education needs related to CAD and COPD No Advanced Directives in place- pt declines at present Patient reports she lives with her older brother, has a daughter she depends on for errands, etc.  Patient needs reinforcement of COPD action plan as she cannot verbalize the plan.  Pt reports she will consider getting the flu vaccine next week when she sees Dr. Lamonte Sakai.  Pt reports she tries to eat healthy, walks some if she is able, becomes dyspneic easily.  RNCM Clinical Goal(s):  Patient will verbalize understanding of plan for management of CAD and COPD as evidenced by review of EHR, collaboration with care team and patient report take all medications exactly as prescribed and will call provider for medication related questions as evidenced by pt review, EHR review.    attend all scheduled medical appointments:  as evidenced by pt report, EHR review, through collaboration with RN Care manager, provider, and care team.    Interventions: 1:1 collaboration with primary care provider regarding development and update of comprehensive plan of care as evidenced by provider attestation and co-signature Inter-disciplinary care team collaboration (see longitudinal plan of care) Evaluation of current treatment plan related to  self management and patient's adherence to plan as established by provider  COPD Interventions:   Provided patient with basic written and verbal COPD education on self care/management/and exacerbation prevention; Provided instruction about proper use of medications used for management of COPD including inhalers; Advised patient to self assesses COPD action plan zone and make appointment with provider if in the yellow zone for 48 hours without improvement; Advised patient to engage in light exercise as tolerated 3-5 days a week to aid in the the management of COPD; Provided education about and advised patient to utilize infection prevention strategies to reduce risk of respiratory infection; Discussed the importance of adequate rest and management of fatigue with COPD; Screening for signs and symptoms of depression related to chronic disease state;  Assessed social determinant of health barriers;  Provided education via My Chart- COPD action plan Pain assessment completed Encouraged patient to get flu vaccine next  week at pulmonary visit on 11/12/21  CAD Interventions:  Assessed understanding of CAD diagnosis Medications reviewed including medications utilized in CAD treatment plan Provided education on importance of blood pressure control in management of CAD; Provided education on Importance of limiting foods high in cholesterol; Counseled on importance of regular laboratory monitoring as prescribed; Reviewed Importance of taking all medications as prescribed  Reviewed importance of adherence to heart healthy diet Provided education via My Chart- heart healthy diet  Patient  Goals/Self-Care Activities: Patient will self administer medications as prescribed as evidenced by self report/primary caregiver report  Patient will attend all scheduled provider appointments as evidenced by clinician review of documented attendance to scheduled appointments and patient/caregiver report Patient will continue to perform ADL's independently as evidenced by patient/caregiver report Patient will continue to perform IADL's independently as evidenced by patient/caregiver report Patient will call provider office for new concerns or questions as evidenced by review of documented incoming telephone call notes and patient report Look over education sent via My Chart- COPD action plan, heart healthy diet Talk to doctor next week about getting flu vaccine Call your doctor early on for change in health status, symptoms Try to do light exercise as tolerated   Follow Up Plan:  Telephone follow up appointment with care management team member scheduled for:  12/13/2021       Plan:Telephone follow up appointment with care management team member scheduled for:  12/13/2021  Jacqlyn Larsen [facility], BSN RN Case Manager [facility] [phone]

## 2021-11-12 ENCOUNTER — Other Ambulatory Visit: Payer: Self-pay

## 2021-11-12 ENCOUNTER — Encounter: Payer: Self-pay | Admitting: Emergency Medicine

## 2021-11-12 ENCOUNTER — Ambulatory Visit (INDEPENDENT_AMBULATORY_CARE_PROVIDER_SITE_OTHER): Payer: Medicare Other | Admitting: Emergency Medicine

## 2021-11-12 VITALS — BP 124/76 | HR 84 | Temp 98.7°F | Ht 62.0 in | Wt 137.8 lb

## 2021-11-12 DIAGNOSIS — I2510 Atherosclerotic heart disease of native coronary artery without angina pectoris: Secondary | ICD-10-CM

## 2021-11-12 DIAGNOSIS — J9611 Chronic respiratory failure with hypoxia: Secondary | ICD-10-CM

## 2021-11-12 DIAGNOSIS — J438 Other emphysema: Secondary | ICD-10-CM | POA: Diagnosis not present

## 2021-11-12 DIAGNOSIS — Z23 Encounter for immunization: Secondary | ICD-10-CM | POA: Diagnosis not present

## 2021-11-12 NOTE — Progress Notes (Signed)
Subjective:    Patient ID: KEELIN NEVILLE, female    DOB: January 27, 1951, 70 y.o.   MRN: [id number]  Shortness of Breath  ROV 03/05/21 --70 year old woman with very severe COPD and associated chronic hypoxemic respiratory failure.  She does not tolerate prednisone but is on cycled antibiotics.  She is doing Brovana and ipratropium in the morning, Breztri mid day and then evening.  She is colonized with Mycobacterium gordonae and returns today to review her CT chest. Remains on allergy and GERD regimens. More dry cough lately especially in the am. She doesn;t [place] any albuterol because it doesn't seem to help her.   She is dyspneic with exertion. Her BD's don't hold her for 12 hours.   High-resolution CT scan of the chest done 03/03/2021 reviewed by me, shows stable scattered pulmonary nodules including stable 1.0 cm lateral right middle lobe groundglass nodule.  ROV 08/25/21 --Willene is 70, has a history of very severe COPD with associated chronic hypoxemic respiratory failure.  She is also colonized with Mycobacterium gordonae, has scattered pulmonary nodular disease associated with this.  She has chronic cough in the setting of allergic rhinitis and GERD. She had COVID in June - has been more SOB since then. She is still able to exert but more winded. Wears her O2 with significant exertion. Coughs mainly in the am. She has some nocturnal awakenings w dyspnea.  Her bronchodilator regimen currently includes Brovana in the am, ipratropium nebulizer. Breztri twice a day, mid-day and evening. Albuterol 4x a day.  Flonase daily. No longer on cycled abx.   ROV 11/12/21 --follow-up visit for very severe COPD, Mycobacterium gordonae colonization with associated pulmonary nodular disease.  She has chronic cough and chronic hypoxemic respiratory failure in the setting of these as well as rhinitis and GERD.  She is been dealing with more exertional dyspnea since she had COVID-19 in June 2022.  She is managed on  [place] as well as Breztri.  At her last visit we tried increasing the Brovana up to twice daily but it perturbs her sleep. She mixes the brovana w ipratropium.   She does not tolerate prednisone well. Her breathing is stable - no significant change. O2 on 2L/min, uses POC from [employer], ? Whether it is working correctly, she is going to have it evaluated.     Review of Systems  Respiratory:  Positive for shortness of breath.  As per HPI     Objective:   Physical Exam Vitals:   11/12/21 1055  BP: 124/76  Pulse: 84  Temp: 98.7 F (37.1 C)  TempSrc: Oral  SpO2: 94%  Weight: 137 lb 12.8 oz (62.5 kg)  Height: 5\' 2"  (1.575 m)   Gen: Pleasant, well-nourished, in no distress,  normal affect  ENT: No lesions,  mouth clear,  oropharynx clear, no postnasal drip  Neck: No JVD, no stridor  Lungs: No use of accessory muscles, very distant, clear, no wheeze on normal breath.   Cardiovascular: RRR, heart sounds normal, no murmur or gallops, no peripheral edema  Musculoskeletal: No deformities, no cyanosis or clubbing  Neuro: alert, non focal  Skin: Warm, no lesions or rashes      Assessment & Plan:  COPD (chronic obstructive pulmonary disease) with emphysema (HCC) Severe disease but overall stable.  No flare since last time.  She did not increase the Brovana twice daily because it causes slight agitation and difficulty sleeping.  She does mix ipratropium nebs with the Brovana which I did not realize.  I do not see it on her medication list I will need to confirm that we are prescribing so that we can continue it.  Asked her to consider using the ipratropium nebs twice a day to see if this offers benefit and is better tolerated than the Brovana.  We will also continue the [facility] as she is taking it.  She needs a flu shot today and is interested in getting the COVID-19 vaccine but wants to avoid a busy pharmacy if possible.  We will look to see if there is a slight available through  [facility].  Please continue Breztri 2 puffs twice a day.  Rinse and gargle after using. Continue your Brovana and ipratropium nebulizer combination once daily.  You could consider using another dose of ipratropium alone later in the day.  We will need to confirm that we are prescribing your ipratropium-I do not have it on your current active medication list. Use albuterol either 2 puffs or 1 nebulizer treatment if needed for shortness of breath, chest tightness, wheezing. Continue your oxygen at 2 L/min at all times. Flu shot today. We will work on finding out which [facility] facilities can offer the new COVID-19 booster shot Follow with Dr Delton Coombes in 6 months or sooner if you have any problems  Chronic respiratory failure (HCC) Continue oxygen at 2 L/min pulsed.  She uses [employer].  She is having trouble with her device and is going to speak to them about possible repair or exchange.  Levy Pupa, MD, PhD 11/12/2021, 11:36 AM [facility] [phone] or if no answer [phone]

## 2021-11-12 NOTE — Assessment & Plan Note (Signed)
Continue oxygen at 2 L/min pulsed.  She uses [facility].  She is having trouble with her device and is going to speak to them about possible repair or exchange.

## 2021-11-12 NOTE — Patient Instructions (Signed)
Please continue Breztri 2 puffs twice a day.  Rinse and gargle after using. Continue your Brovana and ipratropium nebulizer combination once daily.  You could consider using another dose of ipratropium alone later in the day.  We will need to confirm that we are prescribing your ipratropium-I do not have it on your current active medication list. Use albuterol either 2 puffs or 1 nebulizer treatment if needed for shortness of breath, chest tightness, wheezing. Continue your oxygen at 2 L/min at all times. Flu shot today. We will work on finding out which [facility] facilities can offer the new COVID-19 booster shot Follow with Dr Delton Coombes in 6 months or sooner if you have any problems

## 2021-11-12 NOTE — Assessment & Plan Note (Signed)
Severe disease but overall stable.  No flare since last time.  She did not increase the Brovana twice daily because it causes slight agitation and difficulty sleeping.  She does mix ipratropium nebs with the Brovana which I did not realize.  I do not see it on her medication list I will need to confirm that we are prescribing so that we can continue it.  Asked her to consider using the ipratropium nebs twice a day to see if this offers benefit and is better tolerated than the Brovana.  We will also continue the [facility] as she is taking it.  She needs a flu shot today and is interested in getting the COVID-19 vaccine but wants to avoid a busy pharmacy if possible.  We will look to see if there is a slight available through [facility].  Please continue Breztri 2 puffs twice a day.  Rinse and gargle after using. Continue your Brovana and ipratropium nebulizer combination once daily.  You could consider using another dose of ipratropium alone later in the day.  We will need to confirm that we are prescribing your ipratropium-I do not have it on your current active medication list. Use albuterol either 2 puffs or 1 nebulizer treatment if needed for shortness of breath, chest tightness, wheezing. Continue your oxygen at 2 L/min at all times. Flu shot today. We will work on finding out which [facility] facilities can offer the new COVID-19 booster shot Follow with Dr Delton Coombes in 6 months or sooner if you have any problems

## 2021-11-16 ENCOUNTER — Telehealth: Payer: Self-pay | Admitting: Emergency Medicine

## 2021-11-16 MED ORDER — BREZTRI AEROSPHERE 160-9-4.8 MCG/ACT IN AERO: INHALATION_SPRAY | RESPIRATORY_TRACT | 0 refills | Status: DC

## 2021-11-16 NOTE — Telephone Encounter (Signed)
Call made to patient, confirmed DOB. Patient states she lives in [place] and would like to pick the sample up from [place] office.   Message sent via teams no response as of yet. Will route message to . Please contact patient for pick up of Breztri sample if available. If not let us know and we will make the patient aware she will have to come to [place]. Thanks :)

## 2021-11-16 NOTE — Telephone Encounter (Signed)
RB please advise if you are ok with giving a sample of the Breztri to the pt since hers is broken and the pharmacy will not refill for another 10 days.   thanks

## 2021-11-16 NOTE — Telephone Encounter (Signed)
Yes please do

## 2021-11-16 NOTE — Telephone Encounter (Signed)
Pt came by to pick up sample.

## 2021-11-16 NOTE — Telephone Encounter (Signed)
ATC patient to notify her that we do have a breztri sample here at the [facility] office she can have. LMTCB with [facility] office number.

## 2021-11-24 DIAGNOSIS — I2510 Atherosclerotic heart disease of native coronary artery without angina pectoris: Secondary | ICD-10-CM | POA: Diagnosis not present

## 2021-11-24 DIAGNOSIS — J449 Chronic obstructive pulmonary disease, unspecified: Secondary | ICD-10-CM | POA: Diagnosis not present

## 2021-12-13 ENCOUNTER — Ambulatory Visit (INDEPENDENT_AMBULATORY_CARE_PROVIDER_SITE_OTHER): Payer: Medicare Other | Admitting: *Deleted

## 2021-12-13 DIAGNOSIS — J438 Other emphysema: Secondary | ICD-10-CM

## 2021-12-13 DIAGNOSIS — I2510 Atherosclerotic heart disease of native coronary artery without angina pectoris: Secondary | ICD-10-CM

## 2021-12-13 NOTE — Chronic Care Management (AMB) (Signed)
Chronic Care Management   CCM RN Visit Note  12/13/2021 Name: Taylor Burnett MRN: [id number] DOB: 1951/10/02  Subjective: Taylor Burnett is a 70 y.o. year old female who is a primary care patient of Pickard, Priscille Heidelberg, MD. The care management team was consulted for assistance with disease management and care coordination needs.    Engaged with patient by telephone for follow up visit in response to provider referral for case management and/or care coordination services.   Consent to Services:  The patient was given information about Chronic Care Management services, agreed to services, and gave verbal consent prior to initiation of services.  Please see initial visit note for detailed documentation.   Patient agreed to services and verbal consent obtained.   Assessment: Review of patient past medical history, allergies, medications, health status, including review of consultants reports, laboratory and other test data, was performed as part of comprehensive evaluation and provision of chronic care management services.   SDOH (Social Determinants of Health) assessments and interventions performed:    CCM Care Plan  Allergies  Allergen Reactions   Prednisone Other (See Comments)    Depression and suicidal   Toprol Xl [Metoprolol] Other (See Comments)    Fatigue    Outpatient Encounter Medications as of 12/13/2021  Medication Sig   albuterol (PROAIR HFA) 108 (90 Base) MCG/ACT inhaler INHALE 2 PUFFS INTO THE LUNGS EVERY 6 HOURS AS NEEDED.   arformoterol (BROVANA) 15 MCG/2ML NEBU Take 2 mLs (15 mcg total) by nebulization daily.   aspirin EC 81 MG tablet Take 1 tablet (81 mg total) by mouth daily. Swallow whole.   atorvastatin (LIPITOR) 80 MG tablet Take 1 tablet (80 mg total) by mouth daily.   Cholecalciferol (VITAMIN D) 2000 units CAPS Take 2,000 Units by mouth daily.    diltiazem (CARDIZEM CD) 180 MG 24 hr capsule Take 1 capsule (180 mg total) by mouth daily.   fluticasone  (FLONASE) 50 MCG/ACT nasal spray Place 2 sprays into both nostrils every evening.   gabapentin (NEURONTIN) 100 MG capsule Take 1 capsule (100 mg total) by mouth 3 (three) times daily.   Melatonin 10 MG TABS Take 10 mg by mouth at bedtime as needed (sleep).   naproxen sodium (ALEVE) 220 MG tablet Take 440 mg by mouth 2 (two) times daily as needed (pain).    OXYGEN Inhale 2 L into the lungs as needed. FOR USE WITH EXERTION   pantoprazole (PROTONIX) 40 MG tablet Take 1 tablet (40 mg total) by mouth daily.   Facility-Administered Encounter Medications as of 12/13/2021  Medication   denosumab (PROLIA) injection 60 mg    Patient Active Problem List   Diagnosis Date Noted   PAT (paroxysmal atrial tachycardia) (HCC)    CAD (coronary artery disease), native coronary artery    Mycobacterium gordonae infection 03/11/2019   Chronic cough 10/22/2018   GERD (gastroesophageal reflux disease) 07/20/2018   Anxiety 06/21/2017   Prediabetes 06/21/2017   Chronic respiratory failure (HCC) 04/18/2017   Other insomnia 01/23/2017   COPD (chronic obstructive pulmonary disease) with emphysema (HCC) 01/16/2017   Sleep related hypoxia 01/16/2017   Sleep apnea in adult 07/11/2016   Chronic rhinitis 01/05/2016   S/P cervical spinal fusion 03/13/2015   Hemoptysis 01/01/2014   Atypical chest pain 06/08/2012   Neutropenia 12/23/2011   Thrombocytopenia (HCC) 12/22/2011   Hypoxemia 12/19/2011    Conditions to be addressed/monitored:CAD and COPD  Care Plan : RN Care Manager plan of care  Updates made by Jimmye Norman,  Zenaida Deed, RN since 12/13/2021 12:00 AM     Problem: No plan of care established for management of chronic disease states (COPD, CAD)   Priority: High     Long-Range Goal: Development of plan of care for chronic disease management (COPD, CAD)   Start Date: 11/04/2021  Expected End Date: 06/11/2022  Priority: High  Note:   Current Barriers:  Knowledge Deficits related to plan of care for management  of CAD and COPD  Chronic Disease Management support and education needs related to CAD and COPD No Advanced Directives in place- pt declines at present Patient reports she lives with her older brother, has a daughter she depends on for errands, etc.  Patient needs reinforcement of COPD action plan as she cannot verbalize the plan.  Pt reports she did get the flu vaccine.  Pt reports she tries to eat healthy, walks some if she is able, becomes dyspneic easily.  RNCM Clinical Goal(s):  Patient will verbalize understanding of plan for management of CAD and COPD as evidenced by review of EHR, collaboration with care team and patient report take all medications exactly as prescribed and will call provider for medication related questions as evidenced by pt review, EHR review.    attend all scheduled medical appointments:  as evidenced by pt report, EHR review, through collaboration with RN Care manager, provider, and care team.   Interventions: 1:1 collaboration with primary care provider regarding development and update of comprehensive plan of care as evidenced by provider attestation and co-signature Inter-disciplinary care team collaboration (see longitudinal plan of care) Evaluation of current treatment plan related to  self management and patient's adherence to plan as established by provider  COPD Interventions:   Advised patient to track and manage COPD triggers Advised patient to self assesses COPD action plan zone and make appointment with provider if in the yellow zone for 48 hours without improvement Advised patient to engage in light exercise as tolerated 3-5 days a week to aid in the the management of COPD Provided education about and advised patient to utilize infection prevention strategies to reduce risk of respiratory infection RN care manager reviewed upcoming appointments Pain assessment completed  CAD Interventions:  Assessed understanding of CAD diagnosis Medications  reviewed including medications utilized in CAD treatment plan Provided education on importance of blood pressure control in management of CAD Provided education on Importance of limiting foods high in cholesterol Reviewed Importance of taking all medications as prescribed Reviewed Importance of attending all scheduled provider appointments  Reviewed importance of adherence to heart healthy diet Reinforced heart healthy diet Reviewed importance of light exercise if able  Patient Goals/Self-Care Activities: Take medications as prescribed   Attend all scheduled provider appointments Perform all self care activities independently  Perform IADL's (shopping, preparing meals, housekeeping, managing finances) independently Call provider office for new concerns or questions  Follow heart healthy diet Call your doctor early on for change in health status, symptoms Try to do light exercise as tolerated  Follow COPD action plan, call your doctor early on for change in health status, symptoms Practice good handwashing and wear a mask as needed  Follow Up Plan:  Telephone follow up appointment with care management team member scheduled for:  01/28/2022       Plan:Telephone follow up appointment with care management team member scheduled for:  01/28/2022  Irving Shows [facility], BSN RN Case Manager [facility] [phone]

## 2021-12-13 NOTE — Patient Instructions (Signed)
Visit Information  Thank you for taking time to visit with me today. Please don't hesitate to contact me if I can be of assistance to you before our next scheduled telephone appointment.  Following are the goals we discussed today:  Take medications as prescribed   Attend all scheduled provider appointments Perform all self care activities independently  Perform IADL's (shopping, preparing meals, housekeeping, managing finances) independently Call provider office for new concerns or questions  Follow heart healthy diet Call your doctor early on for change in health status, symptoms Try to do light exercise as tolerated  Follow COPD action plan, call your doctor early on for change in health status, symptoms Practice good handwashing and wear a mask as needed  Our next appointment is by telephone on 01/28/2022 at 3 pm  Please call the care guide team at [phone] if you need to cancel or reschedule your appointment.   If you are experiencing a Mental Health or Behavioral Health Crisis or need someone to talk to, please call the [place] National Suicide Prevention Lifeline: (681) 781-3567 or TTY: 847-671-6509 TTY 770 400 4922) to talk to a trained counselor call 1-800-273-TALK (toll free, 24 hour hotline) go to [facility] [address], [place] [phone]) call the [facility] Crisis Line: [phone] call 911   Patient verbalizes understanding of instructions provided today and agrees to view in MyChart.   Irving Shows RNC, BSN RN Case Manager [facility] [phone]

## 2021-12-17 ENCOUNTER — Other Ambulatory Visit: Payer: Self-pay | Admitting: Emergency Medicine

## 2021-12-20 ENCOUNTER — Encounter: Payer: Self-pay | Admitting: Emergency Medicine

## 2021-12-25 DIAGNOSIS — J449 Chronic obstructive pulmonary disease, unspecified: Secondary | ICD-10-CM | POA: Diagnosis not present

## 2021-12-25 DIAGNOSIS — I2510 Atherosclerotic heart disease of native coronary artery without angina pectoris: Secondary | ICD-10-CM

## 2022-01-06 ENCOUNTER — Telehealth: Payer: Self-pay | Admitting: Pharmacist

## 2022-01-06 NOTE — Progress Notes (Signed)
Chronic Care Management Pharmacy Assistant   Name: Taylor Burnett  MRN: [id number] DOB: 1951-06-05   Reason for Encounter: Disease State - Hypertension Call     Recent office visits:  None noted.   Recent consult visits:   11/12/21 Levy Pupa, MD - Need for immunization - Pulmonology -  Flu vaccine administered. Please continue Breztri 2 puffs twice a day.  Rinse and gargle after using. Continue your Brovana and ipratropium nebulizer combination once daily.  You could consider using another dose of ipratropium alone later in the day.  We will need to confirm that we are prescribing your ipratropium-I do not have it on your current active medication list. Use albuterol either 2 puffs or 1 nebulizer treatment if needed for shortness of breath, chest tightness, wheezing. Continue your oxygen at 2 L/min at all times. Follow up in 6 months.   08/25/21 11/12/21 Levy Pupa, MD - Emphysema - Pulmonology - Chest xray ordered. Start taking your Brovana nebulizer twice a day. Continue your Breztri 2 puffs twice a day.  Rinse and gargle after you use it. Continue albuterol 2 puffs or 1 nebulizer treatment up to every 4 hours if needed for shortness of breath, chest tightness, wheezing. Continue your oxygen at 2 L/min with all exertion. Chest x-ray today We can discuss the timing of a repeat CT scan of your chest at your next visit.  We will probably repeat it in March 2023. Continue your Flonase daily Follow with Dr Delton Coombes in 3 months or sooner if you have any problems  Hospital visits:  None in previous 6 months  Medications: Outpatient Encounter Medications as of 01/06/2022  Medication Sig   albuterol (PROAIR HFA) 108 (90 Base) MCG/ACT inhaler INHALE 2 PUFFS INTO THE LUNGS EVERY 6 HOURS AS NEEDED.   arformoterol (BROVANA) 15 MCG/2ML NEBU Take 2 mLs (15 mcg total) by nebulization daily.   aspirin EC 81 MG tablet Take 1 tablet (81 mg total) by mouth daily. Swallow whole.   atorvastatin  (LIPITOR) 80 MG tablet Take 1 tablet (80 mg total) by mouth daily.   BREZTRI AEROSPHERE 160-9-4.8 MCG/ACT AERO INHALE (2) PUFFS INTO THE LUNGS IN THE MORNING AND AT BEDTIME   Cholecalciferol (VITAMIN D) 2000 units CAPS Take 2,000 Units by mouth daily.    diltiazem (CARDIZEM CD) 180 MG 24 hr capsule Take 1 capsule (180 mg total) by mouth daily.   fluticasone (FLONASE) 50 MCG/ACT nasal spray Place 2 sprays into both nostrils every evening.   gabapentin (NEURONTIN) 100 MG capsule Take 1 capsule (100 mg total) by mouth 3 (three) times daily.   Melatonin 10 MG TABS Take 10 mg by mouth at bedtime as needed (sleep).   naproxen sodium (ALEVE) 220 MG tablet Take 440 mg by mouth 2 (two) times daily as needed (pain).    OXYGEN Inhale 2 L into the lungs as needed. FOR USE WITH EXERTION   pantoprazole (PROTONIX) 40 MG tablet Take 1 tablet (40 mg total) by mouth daily.   Facility-Administered Encounter Medications as of 01/06/2022  Medication   denosumab (PROLIA) injection 60 mg   Current antihypertensive regimen:  Diltiazem 180 mg daily     How often are you checking your Blood Pressure?  Patient reported checking blood pressures three times a daily.   Current home BP readings: 120/80 this am 01/10/22   What recent interventions/DTPs have been made by any provider to improve Blood Pressure control since last CPP Visit:  Patient reported no changes  in her current medications.    Any recent hospitalizations or ED visits since last visit with CPP?  Patient has not had any hospitalizations or ED visits since last visit with CPP.   What diet changes have been made to improve Blood Pressure Control?   Patient reported minimizing her salt intake daily.    What exercise is being done to improve your Blood Pressure Control?  Patient reported walking daily and doing housework and reports she stays as active as she can.    Adherence Review: Is the patient currently on ACE/ARB medication? No Does  the patient have >5 day gap between last estimated fill dates? No   Care Gaps  AWV: done 10/29/21 Colonoscopy: unknown upper EGD done 03/25/21 DM Eye Exam: N/A DM Foot Exam: N/A Microalbumin: N/A HbgAIC: N/A DEXA: scheduled 04/11/22 Mammogram: ordered   Star Rating Drugs: Atorvastatin 80 mg - last filled 10/15/21 90 days (pt reported) / Future Appointments  Date Time Provider Department Center  01/28/2022  3:00 PM [facility]-CCM CARE MGR [facility] None  04/11/2022  1:30 PM GI-[facility] DX DEXA 1 [facility]  11/04/2022  9:45 AM [facility]-NURSE HEALTH ADVISOR [facility] None    Eugenie Filler, CCMA Clinical Pharmacist Assistant  [phone]

## 2022-01-28 ENCOUNTER — Telehealth: Payer: Self-pay | Admitting: *Deleted

## 2022-01-28 ENCOUNTER — Telehealth: Payer: Medicare Other

## 2022-01-28 NOTE — Chronic Care Management (AMB) (Signed)
°  Care Management   Note  01/28/2022 Name: [name] MRN: [id number] DOB: Apr 17, 1951  Taylor Burnett is a 71 y.o. year old female who is a primary care patient of Susy Frizzle, MD and is actively engaged with the care management team. I reached out to Vevelyn Pat by phone today to assist with re-scheduling a follow up visit with the RN Case Manager  Follow up plan: Patient declines further follow up and engagement by the care management team. Appropriate care team members and provider have been notified via electronic communication. The care management team is available to follow up with the patient after provider conversation with the patient regarding recommendation for care management engagement and subsequent re-referral to the care management team.   [facility] Management  Direct Dial: [phone]

## 2022-02-01 ENCOUNTER — Other Ambulatory Visit: Payer: Self-pay | Admitting: Family Medicine

## 2022-02-01 ENCOUNTER — Ambulatory Visit: Payer: Self-pay | Admitting: *Deleted

## 2022-02-01 DIAGNOSIS — J438 Other emphysema: Secondary | ICD-10-CM

## 2022-02-01 DIAGNOSIS — I2510 Atherosclerotic heart disease of native coronary artery without angina pectoris: Secondary | ICD-10-CM

## 2022-02-01 NOTE — Chronic Care Management (AMB) (Signed)
° °  02/01/2022  Taylor Burnett 17-Dec-1951 [id number]   Message received 01/28/22 from care guide stating patient no longer wishes to continue with CCM services.  Care plan completed and case closed.  Jacqlyn Larsen RNC, BSN RN Case Manager [facility] [phone]

## 2022-02-02 ENCOUNTER — Encounter: Payer: Self-pay | Admitting: Emergency Medicine

## 2022-02-02 DIAGNOSIS — J438 Other emphysema: Secondary | ICD-10-CM

## 2022-02-02 DIAGNOSIS — J9611 Chronic respiratory failure with hypoxia: Secondary | ICD-10-CM

## 2022-02-02 NOTE — Telephone Encounter (Signed)
I would try nasal saline spray to keep the nasal passages moist.   If she uses a home O2 concentrator, there should be a way to add humidity to this. We may need to contact her DME to get them to confirm and show her how to do it.

## 2022-02-02 NOTE — Telephone Encounter (Signed)
Dr. Byrum, please see mychart messages sent by pt and advise. 

## 2022-03-11 ENCOUNTER — Encounter: Payer: Self-pay | Admitting: Family Medicine

## 2022-03-11 ENCOUNTER — Ambulatory Visit (INDEPENDENT_AMBULATORY_CARE_PROVIDER_SITE_OTHER): Payer: Medicare Other | Admitting: Family Medicine

## 2022-03-11 ENCOUNTER — Other Ambulatory Visit: Payer: Self-pay

## 2022-03-11 VITALS — BP 124/72 | HR 93 | Temp 97.5°F | Resp 18 | Ht 62.0 in | Wt 136.0 lb

## 2022-03-11 DIAGNOSIS — R29898 Other symptoms and signs involving the musculoskeletal system: Secondary | ICD-10-CM

## 2022-03-11 DIAGNOSIS — G629 Polyneuropathy, unspecified: Secondary | ICD-10-CM

## 2022-03-11 DIAGNOSIS — I2510 Atherosclerotic heart disease of native coronary artery without angina pectoris: Secondary | ICD-10-CM

## 2022-03-11 DIAGNOSIS — M25511 Pain in right shoulder: Secondary | ICD-10-CM | POA: Diagnosis not present

## 2022-03-11 DIAGNOSIS — R519 Headache, unspecified: Secondary | ICD-10-CM | POA: Diagnosis not present

## 2022-03-11 NOTE — Progress Notes (Signed)
? ?Subjective:  ? ? Patient ID: Taylor Burnett, female    DOB: [birth year], 71 y.o.   MRN: [id number] ? ?HPI ?7/22 ?Patient is a very pleasant 71 year old Caucasian female who presents today complaining of burning pain in both feet.  She has diminished sensation to 10 g monofilament per tickly in the second through fifth toes bilaterally.  She also has diminished sensation in her feet to vibration suggesting underlying peripheral neuropathy.  She denies any pain radiating down her legs.  The pain initiates on the plantar surfaces of both feet and will radiate up to her mid shins.  Is particularly worse at night.  She states that she tried gabapentin in the past 100 mg 3 times a day and it helped however it seems to have lost its effectiveness.  At that time, my plan was: ? ?Patient has polyneuropathy.  Increase gabapentin to 300 mg p.o. 3 times daily and check TSH CMP and CBC.  Reassess in 2 to 3 weeks to see if gabapentin is helping ? ?03/11/22 ?Patient presents today with several symptoms.  First she reports daily headaches for the last month.  They are located all over her head.  They occur only in the morning when she wakes up.  She constantly feels tired.  Throughout the day the headaches get better.  The headaches are not associated with photophobia or phonophobia.  She denies any sinus pain.  She denies any jaw pain.  Patient has a history of obstructive sleep apnea.  She is not currently wearing CPAP.  She also wears oxygen at night due to nocturnal hypoxia.  However her sleep apnea is currently going untreated. ? ?She also reports that her legs feel like they are getting weaker.  She is currently being treated for peripheral neuropathy with gabapentin.  She continues to have burning stinging pain and the gabapentin helps.  However now her legs electrical buckle.  I performed a neurologic exam today.  Effort at times is suboptimal.  However her muscle strength appears to be 5/5 equal and symmetric in both legs  with ankle flexion extension, knee flexion extension, hip flexion and extension.  She has normal reflexes.  She does have a history of mild spinal stenosis in the lumbar spine, CT scan in 2016. ? ?She also reports bilateral shoulder pain right greater than left.  She has full abduction without pain.  She has a negative Hawkins sign.  However she has crepitus in both shoulders with range of motion and pain with empty can testing. ?Past Medical History:  ?Diagnosis Date  ? Arthritis   ? CAD (coronary artery disease), native coronary artery   ? mild non obstructive CAD of the RCA, LCX, LAD up to 25-49% by coronary CTA 11/2020  ? Cataract   ? COPD (chronic obstructive pulmonary disease) (HCC)   ? Cough   ? DDD (degenerative disc disease), cervical   ? Dysrhythmia   ? RAPID HEART BEAT OCC   ? Emphysema of lung (HCC)   ? GERD (gastroesophageal reflux disease)   ? OCC  ? H/O degenerative disc disease   ? Headache   ? Myocardial infarction [facility]) 2016  ? Osteoporosis   ? Panic attacks   ? PAT (paroxysmal atrial tachycardia) (HCC)   ? noted on event monitor 11/2020  ? Pre-diabetes   ? Severe lung disease   ? Shortness of breath dyspnea   ? Sleep apnea   ? Spondylosis   ? ?Past Surgical History:  ?Procedure Laterality  Date  ? ANTERIOR CERVICAL DECOMP/DISCECTOMY FUSION N/A 03/13/2015  ? Procedure: CERVICAL THREE-CERVICAL FOUR, CERVICAL FOUR-CERVICAL FIVE, CERVICAL FIVE-CERVICAL SIX ANTERIOR CERVICAL DECOMPRESSION/DISCECTOMY FUSION 3 LEVELS;  Surgeon: Tia Alert, MD;  Location: [facility];  Service: Neurosurgery;  Laterality: N/A;  ? BIOPSY  03/25/2021  ? Procedure: BIOPSY;  Surgeon: Rachael Fee, MD;  Location: [facility];  Service: Endoscopy;;  ? BREAST ENHANCEMENT SURGERY  1990  ? CARDIAC CATHETERIZATION  1995  ? CATARACT EXTRACTION W/PHACO Right 05/02/2017  ? Procedure: CATARACT EXTRACTION PHACO AND INTRAOCULAR LENS PLACEMENT (IOC);  Surgeon: Jethro Bolus, MD;  Location: [facility];  Service: Ophthalmology;  Laterality:  Right;  CDE: 6.22  ? CATARACT EXTRACTION W/PHACO Left 05/16/2017  ? Procedure: CATARACT EXTRACTION PHACO AND INTRAOCULAR LENS PLACEMENT (IOC);  Surgeon: Jethro Bolus, MD;  Location: [facility];  Service: Ophthalmology;  Laterality: Left;  CDE: 4.10  ? ESOPHAGOGASTRODUODENOSCOPY (EGD) WITH PROPOFOL N/A 03/25/2021  ? Procedure: ESOPHAGOGASTRODUODENOSCOPY (EGD) WITH PROPOFOL;  Surgeon: Rachael Fee, MD;  Location: [facility];  Service: Endoscopy;  Laterality: N/A;  ? TONSILLECTOMY    ? TUBAL LIGATION    ? VIDEO BRONCHOSCOPY Bilateral 12/31/2018  ? Procedure: VIDEO BRONCHOSCOPY WITHOUT FLUORO;  Surgeon: Leslye Peer, MD;  Location: [facility];  Service: Cardiopulmonary;  Laterality: Bilateral;  ? ?Current Outpatient Medications on File Prior to Visit  ?Medication Sig Dispense Refill  ? albuterol (PROAIR HFA) 108 (90 Base) MCG/ACT inhaler INHALE 2 PUFFS INTO THE LUNGS EVERY 6 HOURS AS NEEDED. 18 g 5  ? arformoterol (BROVANA) 15 MCG/2ML NEBU Take 2 mLs (15 mcg total) by nebulization daily. 60 mL 11  ? aspirin EC 81 MG tablet Take 1 tablet (81 mg total) by mouth daily. Swallow whole. 90 tablet 3  ? atorvastatin (LIPITOR) 80 MG tablet Take 1 tablet (80 mg total) by mouth daily. 90 tablet 3  ? BREZTRI AEROSPHERE 160-9-4.8 MCG/ACT AERO INHALE (2) PUFFS INTO THE LUNGS IN THE MORNING AND AT BEDTIME 10.7 g 4  ? Cholecalciferol (VITAMIN D) 2000 units CAPS Take 2,000 Units by mouth daily.     ? diltiazem (CARDIZEM CD) 180 MG 24 hr capsule Take 1 capsule (180 mg total) by mouth daily. 90 capsule 3  ? fluticasone (FLONASE) 50 MCG/ACT nasal spray Place 2 sprays into both nostrils every evening. 16 mL 12  ? gabapentin (NEURONTIN) 100 MG capsule TAKE 1 CAPSULE BY MOUTH 3 TIMES DAILY. 90 capsule 1  ? Melatonin 10 MG TABS Take 10 mg by mouth at bedtime as needed (sleep).    ? naproxen sodium (ALEVE) 220 MG tablet Take 440 mg by mouth 2 (two) times daily as needed (pain).     ? OXYGEN Inhale 2 L into the lungs as needed. FOR USE WITH  EXERTION    ? pantoprazole (PROTONIX) 40 MG tablet Take 1 tablet (40 mg total) by mouth daily. 30 tablet 11  ? ?Current Facility-Administered Medications on File Prior to Visit  ?Medication Dose Route Frequency Provider Last Rate Last Admin  ? denosumab (PROLIA) injection 60 mg  60 mg Subcutaneous Q6 months Donita Brooks, MD   60 mg at 02/13/19 1553  ? ?Allergies  ?Allergen Reactions  ? Prednisone Other (See Comments)  ?  Depression and suicidal  ? Toprol Xl [Metoprolol] Other (See Comments)  ?  Fatigue  ? ?Social History  ? ?Socioeconomic History  ? Marital status: Divorced  ?  Spouse name: Not on file  ? Number of children: 3  ?  Years of education: Not on file  ? Highest education level: Not on file  ?Occupational History  ? Occupation: on disability.  ?Tobacco Use  ? Smoking status: Former  ?  Packs/day: 2.00  ?  Years: 40.00  ?  Pack years: 80.00  ?  Types: Cigarettes  ?  Quit date: 07/31/2013  ?  Years since quitting: 8.6  ? Smokeless tobacco: Never  ? Tobacco comments:  ?  Would like Lung Ca Screening, quit in 2014. 10/29/2021  ?Vaping Use  ? Vaping Use: Never used  ?Substance and Sexual Activity  ? Alcohol use: No  ?  Comment: OCC WINE   ? Drug use: No  ? Sexual activity: Not on file  ?Other Topics Concern  ? Not on file  ?Social History Narrative  ? Divorced and lives alone.   ? 2 sons and 1 daughter  ? 6 grandchildren.   ? ?Social Determinants of Health  ? ?Financial Resource Strain: Not on file  ?Food Insecurity: No Food Insecurity  ? Worried About Programme researcher, broadcasting/film/video in the Last Year: Never true  ? Ran Out of Food in the Last Year: Never true  ?Transportation Needs: No Transportation Needs  ? Lack of Transportation (Medical): No  ? Lack of Transportation (Non-Medical): No  ?Physical Activity: Sufficiently Active  ? Days of Exercise per Week: 5 days  ? Minutes of Exercise per Session: 30 min  ?Stress: No Stress Concern Present  ? Feeling of Stress : Not at all  ?Social Connections: Moderately Integrated   ? Frequency of Communication with Friends and Family: More than three times a week  ? Frequency of Social Gatherings with Friends and Family: More than three times a week  ? Attends Religious Service

## 2022-03-14 ENCOUNTER — Encounter: Payer: Self-pay | Admitting: Family Medicine

## 2022-03-14 NOTE — Telephone Encounter (Signed)
Taylor Burnett location has been changed in PT referral. Thanks! ?

## 2022-03-18 ENCOUNTER — Ambulatory Visit
Admission: RE | Admit: 2022-03-18 | Discharge: 2022-03-18 | Disposition: A | Discharge status: Home / Self Care | Payer: Medicare Other | Source: Ambulatory Visit | Attending: Family Medicine | Admitting: Family Medicine

## 2022-03-18 DIAGNOSIS — M25511 Pain in right shoulder: Secondary | ICD-10-CM

## 2022-03-21 ENCOUNTER — Other Ambulatory Visit: Payer: Self-pay | Admitting: Adult Health

## 2022-03-21 DIAGNOSIS — R053 Chronic cough: Secondary | ICD-10-CM

## 2022-03-21 DIAGNOSIS — J438 Other emphysema: Secondary | ICD-10-CM

## 2022-03-23 ENCOUNTER — Other Ambulatory Visit: Payer: Self-pay

## 2022-03-23 ENCOUNTER — Ambulatory Visit: Payer: Medicare Other | Attending: Family Medicine

## 2022-03-23 DIAGNOSIS — R262 Difficulty in walking, not elsewhere classified: Secondary | ICD-10-CM | POA: Diagnosis not present

## 2022-03-23 DIAGNOSIS — M79672 Pain in left foot: Secondary | ICD-10-CM | POA: Diagnosis not present

## 2022-03-23 DIAGNOSIS — M6281 Muscle weakness (generalized): Secondary | ICD-10-CM | POA: Diagnosis not present

## 2022-03-23 DIAGNOSIS — R29898 Other symptoms and signs involving the musculoskeletal system: Secondary | ICD-10-CM | POA: Diagnosis not present

## 2022-03-23 DIAGNOSIS — M67912 Unspecified disorder of synovium and tendon, left shoulder: Secondary | ICD-10-CM

## 2022-03-23 DIAGNOSIS — M79671 Pain in right foot: Secondary | ICD-10-CM | POA: Diagnosis not present

## 2022-03-23 NOTE — Therapy (Addendum)
?OUTPATIENT PHYSICAL THERAPY LOWER EXTREMITY EVALUATION/ DISCHARGE SUMMARY ? ? ?Patient Name: Taylor Burnett ?MRN: [id number] ?DOB:1951/08/22, 71 y.o., female ?Today's Date: 03/23/2022 ? ? PT End of Session - 03/23/22 1347   ? ? Visit Number 1   ? Number of Visits 17   ? Date for PT Re-Evaluation 05/18/22   ? Authorization Type MCR   ? Authorization Time Period FOTO v6, v10, kx mod v15   ? Progress Note Due on Visit 10   ? PT Start Time 1300   ? PT Stop Time 1350   ? PT Time Calculation (min) 50 min   ? Activity Tolerance Patient tolerated treatment well   ? Behavior During Therapy [facility] for tasks assessed/performed   ? ?  ?  ? ?  ? ? ?Past Medical History:  ?Diagnosis Date  ? Arthritis   ? CAD (coronary artery disease), native coronary artery   ? mild non obstructive CAD of the RCA, LCX, LAD up to 25-49% by coronary CTA 11/2020  ? Cataract   ? COPD (chronic obstructive pulmonary disease) ([place])   ? Cough   ? DDD (degenerative disc disease), cervical   ? Dysrhythmia   ? RAPID HEART BEAT OCC   ? Emphysema of lung ([place])   ? GERD (gastroesophageal reflux disease)   ? OCC  ? H/O degenerative disc disease   ? Headache   ? Myocardial infarction [facility]) 2016  ? Osteoporosis   ? Panic attacks   ? PAT (paroxysmal atrial tachycardia) ([place])   ? noted on event monitor 11/2020  ? Pre-diabetes   ? Severe lung disease   ? Shortness of breath dyspnea   ? Sleep apnea   ? Spondylosis   ? ?Past Surgical History:  ?Procedure Laterality Date  ? ANTERIOR CERVICAL DECOMP/DISCECTOMY FUSION N/A 03/13/2015  ? Procedure: CERVICAL THREE-CERVICAL FOUR, CERVICAL FOUR-CERVICAL FIVE, CERVICAL FIVE-CERVICAL SIX ANTERIOR CERVICAL DECOMPRESSION/DISCECTOMY FUSION 3 LEVELS;  Surgeon: Eustace Moore, MD;  Location: [place] NEURO ORS;  Service: Neurosurgery;  Laterality: N/A;  ? BIOPSY  03/25/2021  ? Procedure: BIOPSY;  Surgeon: Milus Banister, MD;  Location: [facility];  Service: Endoscopy;;  ? [place]  ? CARDIAC CATHETERIZATION  1995  ?  CATARACT EXTRACTION W/PHACO Right 05/02/2017  ? Procedure: CATARACT EXTRACTION PHACO AND INTRAOCULAR LENS PLACEMENT (IOC);  Surgeon: Rutherford Guys, MD;  Location: [facility];  Service: Ophthalmology;  Laterality: Right;  CDE: 6.22  ? CATARACT EXTRACTION W/PHACO Left 05/16/2017  ? Procedure: CATARACT EXTRACTION PHACO AND INTRAOCULAR LENS PLACEMENT (IOC);  Surgeon: Rutherford Guys, MD;  Location: [facility];  Service: Ophthalmology;  Laterality: Left;  CDE: 4.10  ? ESOPHAGOGASTRODUODENOSCOPY (EGD) WITH PROPOFOL N/A 03/25/2021  ? Procedure: ESOPHAGOGASTRODUODENOSCOPY (EGD) WITH PROPOFOL;  Surgeon: Milus Banister, MD;  [facility];  Service: Endoscopy;  Laterality: N/A;  ? TONSILLECTOMY    ? TUBAL LIGATION    ? VIDEO BRONCHOSCOPY Bilateral 12/31/2018  ? Procedure: VIDEO BRONCHOSCOPY WITHOUT FLUORO;  Surgeon: Collene Gobble, MD;  Location: [facility];  Service: Cardiopulmonary;  Laterality: Bilateral;  ? ?Patient Active Problem List  ? Diagnosis Date Noted  ? PAT (paroxysmal atrial tachycardia) ([place])   ? CAD (coronary artery disease), native coronary artery   ? Mycobacterium gordonae infection 03/11/2019  ? Chronic cough 10/22/2018  ? GERD (gastroesophageal reflux disease) 07/20/2018  ? Anxiety 06/21/2017  ? Prediabetes 06/21/2017  ? Chronic respiratory failure ([place]) 04/18/2017  ? Other insomnia 01/23/2017  ? COPD (chronic obstructive pulmonary disease) with  emphysema ([place]) 01/16/2017  ? Sleep related hypoxia 01/16/2017  ? Sleep apnea in adult 07/11/2016  ? Chronic rhinitis 01/05/2016  ? S/P cervical spinal fusion 03/13/2015  ? Hemoptysis 01/01/2014  ? Atypical chest pain 06/08/2012  ? Neutropenia 12/23/2011  ? Thrombocytopenia ([place]) 12/22/2011  ? Hypoxemia 12/19/2011  ? ? ?PCP: Susy Frizzle, MD ? ?REFERRING PROVIDER: Susy Frizzle, MD ? ?REFERRING DIAG: R29.898 (ICD-10-CM) - Weakness of both lower extremities ? ?THERAPY DIAG:  ?Muscle weakness (generalized) ? ?Difficulty in walking, not elsewhere  classified ? ?Pain in left foot ? ?Pain in right foot ? ?ONSET DATE: 2-3 years ago ? ?SUBJECTIVE:  ? ?SUBJECTIVE STATEMENT: ?Pt reports primary c/o BIL lower leg weakness, along with N/T, burning, and cramping in BIL feet, primarily at the ball of the feet, but occasionally at the arch.  The These symptoms are of insidious onset lasting about 2-3 years. Pt denies any sxs at the knees or proximal to the knees. Pt reports that she mostly notices her pain when sitting after prolonged standing or walking. She reports difficulty falling asleep due to the pain, although it does not wake her from her sleep. Pt adds that if she is exerting herself, her legs can give out "like jello." She also reports low aerobic endurance related to COPD and emphysema; she presents with portable Oxygen tank (2L). Aggravating factors include prolonged standing/walking >8 hours. Easing factors include rest and massage. Current pain is 6/10. Worst pain is 10/10. Best pain is 2-3/10. ? ?PERTINENT HISTORY: ?COPD, pre-diabetes, osteoporosis, emphysema, CAD, hx of MI in 2016, C3-C6 anterior decompression/ fusion in 2016 ? ?PAIN:  ?Are you having pain? Yes: NPRS scale: 6/10 ?Pain location: BIL feet (medial arch, toes) ?Pain description: burning, numb ?Aggravating factors: prolonged standing/walking >8 hours ?Relieving factors: rest and massage ? ?PRECAUTIONS: None ? ?WEIGHT BEARING RESTRICTIONS No ? ?FALLS:  ?Has patient fallen in last 6 months? No ? ?LIVING ENVIRONMENT: ?Lives with: lives with their family ?Lives in: House/apartment ?Stairs: Yes: External: 2 steps; none ?Has following equipment at home: None ? ?OCCUPATION: Retired ? ?PLOF: Independent ? ?PATIENT GOALS: Improve endurance, return to walking without limitation. ? ? ?OBJECTIVE:  ? ?DIAGNOSTIC FINDINGS: None related to primary complaint ? ?PATIENT SURVEYS:  ?FOTO 49%, predicted 59% in 12 visits ? ?COGNITION: ? Overall cognitive status: Within functional limits for tasks  assessed   ?  ?SENSATION: ?Light touch: Impaired diminished L4-S2 on Lt vs Rt ? ?MUSCLE LENGTH: ?Gastrocnemius: Moderate tightness BIL ?Soleus: Moderate tightness BIL ? ?POSTURE:  ?WNL ? ?PALPATION: ?TTP to proximal half of BIL peroneus longus, BIL plantar metatarsal heads, BIL medial arch ? ?LE ROM: ? ?A/PROM Right ?03/23/2022 Left ?03/23/2022  ?Knee flexion WNL WNL  ?Knee extension WNL WNL  ?Ankle dorsiflexion -10/0 2/5  ?Ankle plantarflexion 50 50  ?Ankle inversion 25/35 15p! At lateral lower leg/30p!  ?Ankle eversion 0/30 -5/20p!  ? (Blank rows = not tested) ? ?LE MMT: ? ?MMT Right ?03/23/2022 Left ?03/23/2022  ?Hip flexion 4/5 4/5  ?Hip extension 2/5 2/5  ?Hip abduction 3/5 3/5  ?Knee flexion 4/5 4/5  ?Knee extension 4/5 4/5  ?Ankle dorsiflexion 3+/5 3+/5  ?Ankle plantarflexion 3+/5 3+/5  ?Ankle inversion 3/5 3/5  ?Ankle eversion 3/5 3/5  ? (Blank rows = not tested) ? ?LOWER EXTREMITY SPECIAL TESTS:  ?Ankle special tests: Morton's test: (+) BIL ?Tinel's test-Posterior tibialis:  (-) BIL ?Tinel's test-Deep peroneal:  (+) BIL ?Windlass: (+) BIL ? ?FUNCTIONAL TESTS:  ?DL heel raises: Unable to perform  due to weakness ?Squat: 50% with shaking ?5xSTS: 26 seconds ? ? ? ?TODAY'S TREATMENT: ?Demonstrated and issued HEP ? ? ?PATIENT EDUCATION:  ?Education details: Pt educated on potential underlying pathophysiology behind pain presentation, prognosis, POC, FOTO, HEP ?Person educated: Patient ?Education method: Explanation, Demonstration, and Handouts ?Education comprehension: verbalized understanding and returned demonstration ? ? ?HOME EXERCISE PROGRAM: ?Access Code: [email or web address] ?URL: [email or web address] ?Date: 03/23/2022 ?Prepared by: Vanessa [place] ? ?Exercises ?- Supine Peroneal Nerve Glide  - 1 x daily - 7 x weekly - 3 sets - 15 reps ?- Seated Heel Raise with HANDHOLD RESISTANCE  - 1 x daily - 7 x weekly - 3 sets - 10 reps - 3-sec hold ?- Supine Bridge  - 1 x daily - 7 x weekly - 3 sets - 10 reps -  3-sec hold ?- Ankle Inversion Eversion Towel Slide  - 1 x daily - 7 x weekly - 3 sets - 5 reps ? ?ASSESSMENT: ? ?CLINICAL IMPRESSION: ?Patient is a 71 y.o. F who was seen today for physical therapy evaluation and treatment for chroni

## 2022-03-24 ENCOUNTER — Encounter: Payer: Self-pay | Admitting: Family Medicine

## 2022-04-06 ENCOUNTER — Ambulatory Visit: Payer: Medicare Other

## 2022-04-08 ENCOUNTER — Ambulatory Visit: Payer: Medicare Other | Admitting: Physical Therapy

## 2022-04-11 ENCOUNTER — Ambulatory Visit
Admission: RE | Admit: 2022-04-11 | Discharge: 2022-04-11 | Disposition: A | Discharge status: Home / Self Care | Payer: Medicare Other | Source: Ambulatory Visit | Attending: Family Medicine | Admitting: Family Medicine

## 2022-04-11 DIAGNOSIS — Z780 Asymptomatic menopausal state: Secondary | ICD-10-CM | POA: Diagnosis not present

## 2022-04-11 DIAGNOSIS — M810 Age-related osteoporosis without current pathological fracture: Secondary | ICD-10-CM | POA: Diagnosis not present

## 2022-04-13 ENCOUNTER — Ambulatory Visit (INDEPENDENT_AMBULATORY_CARE_PROVIDER_SITE_OTHER): Payer: Medicare Other

## 2022-04-13 ENCOUNTER — Ambulatory Visit (INDEPENDENT_AMBULATORY_CARE_PROVIDER_SITE_OTHER): Payer: Medicare Other | Admitting: Orthopedic Surgery

## 2022-04-13 ENCOUNTER — Encounter: Payer: Self-pay | Admitting: Orthopedic Surgery

## 2022-04-13 DIAGNOSIS — I2510 Atherosclerotic heart disease of native coronary artery without angina pectoris: Secondary | ICD-10-CM | POA: Diagnosis not present

## 2022-04-13 DIAGNOSIS — M5412 Radiculopathy, cervical region: Secondary | ICD-10-CM | POA: Diagnosis not present

## 2022-04-13 DIAGNOSIS — G8929 Other chronic pain: Secondary | ICD-10-CM

## 2022-04-13 DIAGNOSIS — M25512 Pain in left shoulder: Secondary | ICD-10-CM

## 2022-04-13 DIAGNOSIS — M25511 Pain in right shoulder: Secondary | ICD-10-CM

## 2022-04-13 DIAGNOSIS — M7551 Bursitis of right shoulder: Secondary | ICD-10-CM

## 2022-04-14 ENCOUNTER — Ambulatory Visit: Payer: Medicare Other

## 2022-04-14 ENCOUNTER — Encounter: Payer: Self-pay | Admitting: Orthopedic Surgery

## 2022-04-14 NOTE — Progress Notes (Signed)
? ?Office Visit Note ?  ?Patient: Taylor Burnett           ?Date of Birth: 28-May-1951           ?MRN: [id number] ?Visit Date: 04/13/2022 ?Requested by: Donita Brooks, MD ?[address] [place],  [place] [address] ?PCP: Donita Brooks, MD ? ?Subjective: ?Chief Complaint  ?Patient presents with  ? Right Shoulder - Pain  ? Left Shoulder - Pain  ? ? ?HPI: Taylor Burnett is a 71 year old patient with bilateral shoulder pain right worse than left.  Pain has been going on for 6 months.  Takes Aleve which does not help.  Does not have any range of motion issues just aching and sharp pain which occasionally radiates down to the elbow.  Also reports pain which radiates across her upper back.  Pain does wake her from sleep at night.  She has a history of neck fusion in 2017.  C-spine MRI from 2020 demonstrates some residual stenosis in the C3-4 region. ?             ?ROS: All systems reviewed are negative as they relate to the chief complaint within the history of present illness.  Patient denies  fevers or chills. ? ? ?Assessment & Plan: ?Visit Diagnoses:  ?1. Chronic left shoulder pain   ?2. Right shoulder pain, unspecified chronicity   ?3. Radiculopathy, cervical region   ? ? ?Plan: Impression is bilateral shoulder pain right worse than left.  I think there is a component of cervical radiculopathy involved related to her prior surgery.  She may also have some rotator cuff pathology.  The pain pattern is more consistent with neck origin as opposed to shoulder origin of pain.  She describes dropping things and also reports some decreased strength in her hands.  Plan today is diagnostic and therapeutic subacromial injection into the right shoulder.  MRI arthrogram of the right shoulder to evaluate rotator cuff tear.  She does have some coarseness and grinding in the right shoulder but it is not to the degree that I would expect it to be giving her this amount of symptoms.  MRI cervical spine to evaluate bilateral  radiculopathy.  Follow-up after that study. ? ?Follow-Up Instructions: No follow-ups on file.  ? ?Orders:  ?Orders Placed This Encounter  ?Procedures  ? XR Shoulder Left  ? MR Cervical Spine w/o contrast  ? MR SHOULDER RIGHT W CONTRAST  ? Arthrogram  ? ?No orders of the defined types were placed in this encounter. ? ? ? ? Procedures: ?Large Joint Inj: R subacromial bursa on 04/13/2022 6:57 AM ?Indications: diagnostic evaluation and pain ?Details: 18 G 1.5 in needle, posterior approach ? ?Arthrogram: No ? ?Medications: 9 mL bupivacaine 0.5 %; 40 mg methylPREDNISolone acetate 40 MG/ML; 5 mL lidocaine 1 % ?Outcome: tolerated well, no immediate complications ?Procedure, treatment alternatives, risks and benefits explained, specific risks discussed. Consent was given by the patient. Immediately prior to procedure a time out was called to verify the correct patient, procedure, equipment, support staff and site/side marked as required. Patient was prepped and draped in the usual sterile fashion.  ? ? ? ? ?Clinical Data: ?No additional findings. ? ?Objective: ?Vital Signs: There were no vitals taken for this visit. ? ?Physical Exam:  ? ?Constitutional: Patient appears well-developed ?HEENT:  ?Head: Normocephalic ?Eyes:EOM are normal ?Neck: Normal range of motion ?Cardiovascular: Normal rate ?Pulmonary/chest: Effort normal ?Neurologic: Patient is alert ?Skin: Skin is warm ?Psychiatric: Patient has normal  mood and affect ? ? ?Ortho Exam: Ortho exam demonstrates pretty reasonable cervical spine examination considering her prior fusion.  She lacks about 50% of her flexion extension arc and rotates about 40 degrees bilaterally.  5 out of 5 grip EPL FPL interosseous resection extension bicep triceps and deltoid strength with good rotator cuff strength I stated infraspinatus supraspinatus and subscap muscle testing.  Deltoid fires bilaterally.  No definite paresthesias C5-T1.  No restriction of passive range of motion of either  shoulder.  Passive range of motion in each shoulder is approximately 60/100/170.  No masses lymphadenopathy or skin changes noted in the shoulder girdle region. ? ?Specialty Comments:  ?No specialty comments available. ? ?Imaging: ?XR Shoulder Left ? ?Result Date: 04/13/2022 ?AP axillary outlet radiographs left shoulder reviewed.  No significant glenohumeral joint or AC joint arthritis.  No acute fracture.  Shoulder is located.  Visualized lung fields clear.  ? ? ?PMFS History: ?Patient Active Problem List  ? Diagnosis Date Noted  ? PAT (paroxysmal atrial tachycardia) ([facility])   ? CAD (coronary artery disease), native coronary artery   ? Mycobacterium gordonae infection 03/11/2019  ? Chronic cough 10/22/2018  ? GERD (gastroesophageal reflux disease) 07/20/2018  ? Anxiety 06/21/2017  ? Prediabetes 06/21/2017  ? Chronic respiratory failure ([facility]) 04/18/2017  ? Other insomnia 01/23/2017  ? COPD (chronic obstructive pulmonary disease) with emphysema ([facility]) 01/16/2017  ? Sleep related hypoxia 01/16/2017  ? Sleep apnea in adult 07/11/2016  ? Chronic rhinitis 01/05/2016  ? S/P cervical spinal fusion 03/13/2015  ? Hemoptysis 01/01/2014  ? Atypical chest pain 06/08/2012  ? Neutropenia 12/23/2011  ? Thrombocytopenia ([facility]) 12/22/2011  ? Hypoxemia 12/19/2011  ? ?Past Medical History:  ?Diagnosis Date  ? Arthritis   ? CAD (coronary artery disease), native coronary artery   ? mild non obstructive CAD of the RCA, LCX, LAD up to 25-49% by coronary CTA 11/2020  ? Cataract   ? COPD (chronic obstructive pulmonary disease) ([facility])   ? Cough   ? DDD (degenerative disc disease), cervical   ? Dysrhythmia   ? RAPID HEART BEAT OCC   ? Emphysema of lung ([facility])   ? GERD (gastroesophageal reflux disease)   ? OCC  ? H/O degenerative disc disease   ? Headache   ? Myocardial infarction [facility]) 2016  ? Osteoporosis   ? Panic attacks   ? PAT (paroxysmal atrial tachycardia) ([facility])   ? noted on event monitor 11/2020  ? Pre-diabetes   ? Severe lung disease   ?  Shortness of breath dyspnea   ? Sleep apnea   ? Spondylosis   ?  ?Family History  ?Problem Relation Age of Onset  ? Emphysema Father   ? Allergies Mother   ? Allergies Son   ? Allergies Brother   ? Heart disease Maternal Grandmother   ? Rheum arthritis Brother   ? Cancer Other   ?     aunts and uncles  ?  ?Past Surgical History:  ?Procedure Laterality Date  ? ANTERIOR CERVICAL DECOMP/DISCECTOMY FUSION N/A 03/13/2015  ? Procedure: CERVICAL THREE-CERVICAL FOUR, CERVICAL FOUR-CERVICAL FIVE, CERVICAL FIVE-CERVICAL SIX ANTERIOR CERVICAL DECOMPRESSION/DISCECTOMY FUSION 3 LEVELS;  Surgeon: Tia Alert, MD;  Location: [facility]O ORS;  Service: Neurosurgery;  Laterality: N/A;  ? BIOPSY  03/25/2021  ? Procedure: BIOPSY;  Surgeon: Rachael Fee, MD;  Location: [facility];  Service: Endoscopy;;  ? BREAST ENHANCEMENT SURGERY  1990  ? CARDIAC CATHETERIZATION  1995  ? CATARACT EXTRACTION W/PHACO Right 05/02/2017  ?  Procedure: CATARACT EXTRACTION PHACO AND INTRAOCULAR LENS PLACEMENT (IOC);  Surgeon: Jethro Bolus, MD;  Location[facility];  Service: Ophthalmology;  Laterality: Right;  CDE: 6.22  ? CATARACT EXTRACTION W/PHACO Left 05/16/2017  ? Procedure: CATARACT EXTRACTION PHACO AND INTRAOCULAR LENS PLACEMENT (IOC);  Surgeon: Jethro Bolus, MD;  [facility];  Service: Ophthalmology;  Laterality: Left;  CDE: 4.10  ? ESOPHAGOGASTRODUODENOSCOPY (EGD) WITH PROPOFOL N/A 03/25/2021  ? Procedure: ESOPHAGOGASTRODUODENOSCOPY (EGD) WITH PROPOFOL;  Surgeon: Rachael Fee, MD;  [facility];  Service: Endoscopy;  Laterality: N/A;  ? TONSILLECTOMY    ? TUBAL LIGATION    ? VIDEO BRONCHOSCOPY Bilateral 12/31/2018  ? Procedure: VIDEO BRONCHOSCOPY WITHOUT FLUORO;  Surgeon: Leslye Peer, MD;  Location: [facility];  Service: Cardiopulmonary;  Laterality: Bilateral;  ? ?Social History  ? ?Occupational History  ? Occupation: on disability.  ?Tobacco Use  ? Smoking status: Former  ?  Packs/day: 2.00  ?  Years: 40.00  ?  Pack years:  80.00  ?  Types: Cigarettes  ?  Quit date: 07/31/2013  ?  Years since quitting: 8.7  ? Smokeless tobacco: Never  ? Tobacco comments:  ?  Would like Lung Ca Screening, quit in 2014. 10/29/2021  ?Vaping Use  ? Vaping Use:

## 2022-04-15 MED ORDER — BUPIVACAINE HCL 0.5 % IJ SOLN
9.0000 mL | INTRAMUSCULAR | Status: AC | PRN
  Administered 2022-04-13: 9 mL via INTRA_ARTICULAR

## 2022-04-15 MED ORDER — METHYLPREDNISOLONE ACETATE 40 MG/ML IJ SUSP
40.0000 mg | INTRAMUSCULAR | Status: AC | PRN
  Administered 2022-04-13: 40 mg via INTRA_ARTICULAR

## 2022-04-15 MED ORDER — LIDOCAINE HCL 1 % IJ SOLN
5.0000 mL | INTRAMUSCULAR | Status: AC | PRN
  Administered 2022-04-13: 5 mL

## 2022-04-17 ENCOUNTER — Ambulatory Visit: Payer: Medicare Other

## 2022-04-19 ENCOUNTER — Other Ambulatory Visit: Payer: Self-pay | Admitting: Emergency Medicine

## 2022-04-19 ENCOUNTER — Other Ambulatory Visit: Payer: Self-pay

## 2022-04-19 ENCOUNTER — Encounter: Payer: Self-pay | Admitting: Emergency Medicine

## 2022-04-19 ENCOUNTER — Other Ambulatory Visit: Payer: Self-pay | Admitting: Physician Assistant

## 2022-04-19 ENCOUNTER — Other Ambulatory Visit: Payer: Medicare Other

## 2022-04-19 MED ORDER — CALCIUM CARBONATE 600 MG PO TABS: 1200.0000 mg | ORAL_TABLET | Freq: Every day | ORAL | 0 refills | Status: DC

## 2022-04-19 MED ORDER — ALENDRONATE SODIUM 70 MG PO TABS: 70.0000 mg | ORAL_TABLET | ORAL | 11 refills | Status: DC

## 2022-04-20 ENCOUNTER — Other Ambulatory Visit: Payer: Self-pay | Admitting: Emergency Medicine

## 2022-04-20 ENCOUNTER — Other Ambulatory Visit: Payer: Medicare Other

## 2022-04-21 ENCOUNTER — Other Ambulatory Visit: Payer: Self-pay

## 2022-04-21 ENCOUNTER — Ambulatory Visit: Payer: Medicare Other

## 2022-04-21 DIAGNOSIS — M810 Age-related osteoporosis without current pathological fracture: Secondary | ICD-10-CM

## 2022-04-21 DIAGNOSIS — Z961 Presence of intraocular lens: Secondary | ICD-10-CM | POA: Diagnosis not present

## 2022-04-22 ENCOUNTER — Encounter: Payer: Self-pay | Admitting: Family Medicine

## 2022-04-25 ENCOUNTER — Ambulatory Visit
Admission: RE | Admit: 2022-04-25 | Discharge: 2022-04-25 | Disposition: A | Discharge status: Home / Self Care | Payer: Medicare Other | Source: Ambulatory Visit | Attending: Orthopedic Surgery | Admitting: Orthopedic Surgery

## 2022-04-25 DIAGNOSIS — M542 Cervicalgia: Secondary | ICD-10-CM | POA: Diagnosis not present

## 2022-04-25 DIAGNOSIS — M5412 Radiculopathy, cervical region: Secondary | ICD-10-CM | POA: Diagnosis not present

## 2022-04-25 DIAGNOSIS — R200 Anesthesia of skin: Secondary | ICD-10-CM | POA: Diagnosis not present

## 2022-04-25 DIAGNOSIS — M4312 Spondylolisthesis, cervical region: Secondary | ICD-10-CM | POA: Diagnosis not present

## 2022-04-25 DIAGNOSIS — M4802 Spinal stenosis, cervical region: Secondary | ICD-10-CM | POA: Diagnosis not present

## 2022-04-27 ENCOUNTER — Ambulatory Visit
Admission: RE | Admit: 2022-04-27 | Discharge: 2022-04-27 | Disposition: A | Discharge status: Home / Self Care | Payer: Medicare Other | Source: Ambulatory Visit | Attending: Orthopedic Surgery | Admitting: Orthopedic Surgery

## 2022-04-27 DIAGNOSIS — M25511 Pain in right shoulder: Secondary | ICD-10-CM

## 2022-04-27 DIAGNOSIS — S46011A Strain of muscle(s) and tendon(s) of the rotator cuff of right shoulder, initial encounter: Secondary | ICD-10-CM | POA: Diagnosis not present

## 2022-04-27 DIAGNOSIS — G8929 Other chronic pain: Secondary | ICD-10-CM | POA: Diagnosis not present

## 2022-04-27 MED ORDER — IOPAMIDOL (ISOVUE-M 200) INJECTION 41%
13.0000 mL | Freq: Once | INTRAMUSCULAR | Status: AC
  Administered 2022-04-27: 13 mL via INTRA_ARTICULAR

## 2022-04-28 ENCOUNTER — Ambulatory Visit: Payer: Medicare Other

## 2022-05-04 ENCOUNTER — Encounter: Payer: Self-pay | Admitting: Orthopedic Surgery

## 2022-05-04 ENCOUNTER — Ambulatory Visit (INDEPENDENT_AMBULATORY_CARE_PROVIDER_SITE_OTHER): Payer: Medicare Other | Admitting: Orthopedic Surgery

## 2022-05-04 DIAGNOSIS — I2510 Atherosclerotic heart disease of native coronary artery without angina pectoris: Secondary | ICD-10-CM | POA: Diagnosis not present

## 2022-05-04 DIAGNOSIS — G8929 Other chronic pain: Secondary | ICD-10-CM | POA: Diagnosis not present

## 2022-05-04 DIAGNOSIS — M25511 Pain in right shoulder: Secondary | ICD-10-CM

## 2022-05-04 DIAGNOSIS — M25512 Pain in left shoulder: Secondary | ICD-10-CM | POA: Diagnosis not present

## 2022-05-04 NOTE — Progress Notes (Signed)
? ?Office Visit Note ?  ?Patient: Taylor Burnett           ?Date of Birth: August 01, 1951           ?MRN: [id number] ?Visit Date: 05/04/2022 ?Requested by: Donita Brooks, MD ?[address] [place],  [place] [address] ?PCP: Donita Brooks, MD ? ?Subjective: ?Chief Complaint  ?Patient presents with  ? Left Shoulder - Pain  ? ? ?HPI: Taylor Burnett is a 71 year old patient with neck and shoulder pain.  Since she was last seen she has had an MRI scan of her neck which shows C2-3 moderate stenosis and facet arthritis along with cervical fusion multiple levels below.  She does describe both left and right-sided symptoms.  Shoulder demonstrates massive retracted supraspinatus and partial infraspinatus rotator cuff tear.  Injection into the shoulder joint did take the edge off.  She does report good function but significant pain in the shoulder.  Uses Aleve and ice. ?             ?ROS: All systems reviewed are negative as they relate to the chief complaint within the history of present illness.  Patient denies  fevers or chills. ? ? ?Assessment & Plan: ?Visit Diagnoses:  ?1. Chronic left shoulder pain   ?2. Right shoulder pain, unspecified chronicity   ? ? ?Plan: Impression is neck symptoms likely contributing to upper shoulder girdle region pain.  She also has significant rotator cuff tear which is not really repairable at this time.  Because it is only involving most of the supraspinatus she does continue with good function.  Neck injection would be neck step.  Could also consider shoulder injection.  She will consider these options and come back as needed.  She does not really want to do anything intervention wise today. ? ?Follow-Up Instructions: Return if symptoms worsen or fail to improve.  ? ?Orders:  ?No orders of the defined types were placed in this encounter. ? ?No orders of the defined types were placed in this encounter. ? ? ? ? Procedures: ?No procedures performed ? ? ?Clinical Data: ?No additional  findings. ? ?Objective: ?Vital Signs: There were no vitals taken for this visit. ? ?Physical Exam:  ? ?Constitutional: Patient appears well-developed ?HEENT:  ?Head: Normocephalic ?Eyes:EOM are normal ?Neck: Normal range of motion ?Cardiovascular: Normal rate ?Pulmonary/chest: Effort normal ?Neurologic: Patient is alert ?Skin: Skin is warm ?Psychiatric: Patient has normal mood and affect ? ? ?Ortho Exam: Ortho exam demonstrates pretty reasonable cervical spine range of motion with flexion almost chin to chest and extension about 30 degrees.  Rotation is about 40 degrees bilaterally.  5 out of 5 grip EPL FPL interosseous wrist flexion extension bicep triceps and deltoid strength.  Patient does have forward flexion and AB duction 170 and 100 respectively.  Mild crepitus with internal/external rotation of that right arm.  No muscle atrophy in the right arm versus left. ? ?Specialty Comments:  ?No specialty comments available. ? ?Imaging: ?No results found. ? ? ?PMFS History: ?Patient Active Problem List  ? Diagnosis Date Noted  ? PAT (paroxysmal atrial tachycardia) ([facility])   ? CAD (coronary artery disease), native coronary artery   ? Mycobacterium gordonae infection 03/11/2019  ? Chronic cough 10/22/2018  ? GERD (gastroesophageal reflux disease) 07/20/2018  ? Anxiety 06/21/2017  ? Prediabetes 06/21/2017  ? Chronic respiratory failure ([facility]) 04/18/2017  ? Other insomnia 01/23/2017  ? COPD (chronic obstructive pulmonary disease) with emphysema ([facility]) 01/16/2017  ? Sleep related hypoxia  01/16/2017  ? Sleep apnea in adult 07/11/2016  ? Chronic rhinitis 01/05/2016  ? S/P cervical spinal fusion 03/13/2015  ? Hemoptysis 01/01/2014  ? Atypical chest pain 06/08/2012  ? Neutropenia 12/23/2011  ? Thrombocytopenia ([facility]) 12/22/2011  ? Hypoxemia 12/19/2011  ? ?Past Medical History:  ?Diagnosis Date  ? Arthritis   ? CAD (coronary artery disease), native coronary artery   ? mild non obstructive CAD of the RCA, LCX, LAD up to 25-49% by  coronary CTA 11/2020  ? Cataract   ? COPD (chronic obstructive pulmonary disease) ([facility])   ? Cough   ? DDD (degenerative disc disease), cervical   ? Dysrhythmia   ? RAPID HEART BEAT OCC   ? Emphysema of lung (HCC)   ? GERD (gastroesophageal reflux disease)   ? OCC  ? H/O degenerative disc disease   ? Headache   ? Myocardial infarction [facility]) 2016  ? Osteoporosis   ? Panic attacks   ? PAT (paroxysmal atrial tachycardia) ([facility])   ? noted on event monitor 11/2020  ? Pre-diabetes   ? Severe lung disease   ? Shortness of breath dyspnea   ? Sleep apnea   ? Spondylosis   ?  ?Family History  ?Problem Relation Age of Onset  ? Emphysema Father   ? Allergies Mother   ? Allergies Son   ? Allergies Brother   ? Heart disease Maternal Grandmother   ? Rheum arthritis Brother   ? Cancer Other   ?     aunts and uncles  ?  ?Past Surgical History:  ?Procedure Laterality Date  ? ANTERIOR CERVICAL DECOMP/DISCECTOMY FUSION N/A 03/13/2015  ? Procedure: CERVICAL THREE-CERVICAL FOUR, CERVICAL FOUR-CERVICAL FIVE, CERVICAL FIVE-CERVICAL SIX ANTERIOR CERVICAL DECOMPRESSION/DISCECTOMY FUSION 3 LEVELS;  Surgeon: Tia Alert, MD;  Location: [facility]O [facility];  Service: Neurosurgery;  Laterality: N/A;  ? BIOPSY  03/25/2021  ? Procedure: BIOPSY;  Surgeon: Rachael Fee, MD;  Location: [facility];  Service: Endoscopy;;  ? BREAST ENHANCEMENT SURGERY  1990  ? CARDIAC CATHETERIZATION  1995  ? CATARACT EXTRACTION W/PHACO Right 05/02/2017  ? Procedure: CATARACT EXTRACTION PHACO AND INTRAOCULAR LENS PLACEMENT (IOC);  Surgeon: Jethro Bolus, MD;  Location: [facility];  Service: Ophthalmology;  Laterality: Right;  CDE: 6.22  ? CATARACT EXTRACTION W/PHACO Left 05/16/2017  ? Procedure: CATARACT EXTRACTION PHACO AND INTRAOCULAR LENS PLACEMENT (IOC);  Surgeon: Jethro Bolus, MD;  Location: [facility];  Service: Ophthalmology;  Laterality: Left;  CDE: 4.10  ? ESOPHAGOGASTRODUODENOSCOPY (EGD) WITH PROPOFOL N/A 03/25/2021  ? Procedure: ESOPHAGOGASTRODUODENOSCOPY (EGD) WITH  PROPOFOL;  Surgeon: Rachael Fee, MD;  Location: [facility];  Service: Endoscopy;  Laterality: N/A;  ? TONSILLECTOMY    ? TUBAL LIGATION    ? VIDEO BRONCHOSCOPY Bilateral 12/31/2018  ? Procedure: VIDEO BRONCHOSCOPY WITHOUT FLUORO;  Surgeon: Leslye Peer, MD;  Location: [facility];  Service: Cardiopulmonary;  Laterality: Bilateral;  ? ?Social History  ? ?Occupational History  ? Occupation: on disability.  ?Tobacco Use  ? Smoking status: Former  ?  Packs/day: 2.00  ?  Years: 40.00  ?  Pack years: 80.00  ?  Types: Cigarettes  ?  Quit date: 07/31/2013  ?  Years since quitting: 8.7  ? Smokeless tobacco: Never  ? Tobacco comments:  ?  Would like Lung Ca Screening, quit in 2014. 10/29/2021  ?Vaping Use  ? Vaping Use: Never used  ?Substance and Sexual Activity  ? Alcohol use: No  ?  Comment: OCC WINE   ? Drug use:  No  ? Sexual activity: Not on file  ? ? ? ? ? ?

## 2022-05-05 ENCOUNTER — Ambulatory Visit: Payer: Medicare Other | Admitting: "Endocrinology

## 2022-05-16 ENCOUNTER — Telehealth: Payer: Self-pay | Admitting: Emergency Medicine

## 2022-05-16 MED ORDER — PREDNISONE 10 MG PO TABS
ORAL_TABLET | ORAL | 0 refills | Status: AC
Start: 2022-05-16 — End: 2022-05-28

## 2022-05-16 MED ORDER — DOXYCYCLINE HYCLATE 100 MG PO TABS
100.0000 mg | ORAL_TABLET | Freq: Two times a day (BID) | ORAL | 0 refills | Status: DC
Start: 2022-05-16 — End: 2022-06-22

## 2022-05-16 NOTE — Telephone Encounter (Signed)
Called patient and she states for the past few days she has been sick. She states her fever is 102. She is having SOB when she is doing any activity. Cough that is not productive. Congestion is noted. Covid was negative.   Please advise Dr Lamonte Sakai

## 2022-05-16 NOTE — Telephone Encounter (Signed)
Called and spoke with pt letting her know recs per RB and she verbalized understanding. Nothing further needed. °

## 2022-05-16 NOTE — Telephone Encounter (Signed)
Please have her start OTC decongestant such as tylenol cold and flu (take as directed on package)  Have her take pred taper > Take 40mg  daily for 3 days, then 30mg  daily for 3 days, then 20mg  daily for 3 days, then 10mg  daily for 3 days, then stop Take doxycycline 100mg  bid x 7 days

## 2022-05-18 ENCOUNTER — Ambulatory Visit: Payer: Medicare Other | Admitting: Emergency Medicine

## 2022-06-01 ENCOUNTER — Encounter: Payer: Self-pay | Admitting: Family Medicine

## 2022-06-02 ENCOUNTER — Other Ambulatory Visit: Payer: Self-pay | Admitting: Family Medicine

## 2022-06-12 ENCOUNTER — Other Ambulatory Visit: Payer: Self-pay | Admitting: Family Medicine

## 2022-06-12 ENCOUNTER — Encounter: Payer: Self-pay | Admitting: Emergency Medicine

## 2022-06-13 ENCOUNTER — Other Ambulatory Visit: Payer: Self-pay | Admitting: Emergency Medicine

## 2022-06-13 MED ORDER — BREZTRI AEROSPHERE 160-9-4.8 MCG/ACT IN AERO: INHALATION_SPRAY | RESPIRATORY_TRACT | 5 refills | Status: DC

## 2022-06-14 NOTE — Telephone Encounter (Signed)
Requested medication (s) are due for refill today: Due 07/02/22  Requested medication (s) are on the active medication list: yes    Last refill: 06/02/22  #90  0 refills  Future visit scheduled  With nurse 11/04/22  Notes to clinic:Historical Provider, please review. Thank you.  Requested Prescriptions  Pending Prescriptions Disp Refills   gabapentin (NEURONTIN) 100 MG capsule 90 capsule 0    Sig: Take 1 capsule (100 mg total) by mouth 3 (three) times daily.     Neurology: Anticonvulsants - gabapentin Passed - 06/14/2022  9:55 AM      Passed - Cr in normal range and within 360 days    Creat  Date Value Ref Range Status  07/20/2021 0.96 0.50 - 1.05 mg/dL Final         Passed - Completed PHQ-2 or PHQ-9 in the last 360 days      Passed - Valid encounter within last 12 months    Recent Outpatient Visits           3 months ago Acute pain of right shoulder   [facility] Tanya Nones, Priscille Heidelberg, MD   10 months ago Neuropathy   [facility] Tanya Nones, Priscille Heidelberg, MD   1 year ago Chest pain, unspecified type   [facility] Donita Brooks, MD   2 years ago Rib pain on left side   [facility] Donita Brooks, MD   2 years ago Low back pain at multiple sites   [facility] Pickard, Priscille Heidelberg, MD       Future Appointments             In 1 week Byrum, Les Pou, MD [facility]

## 2022-06-22 ENCOUNTER — Other Ambulatory Visit: Payer: Self-pay | Admitting: Adult Health

## 2022-06-22 ENCOUNTER — Ambulatory Visit (INDEPENDENT_AMBULATORY_CARE_PROVIDER_SITE_OTHER): Payer: Medicare Other | Admitting: Emergency Medicine

## 2022-06-22 ENCOUNTER — Encounter: Payer: Self-pay | Admitting: Emergency Medicine

## 2022-06-22 DIAGNOSIS — R053 Chronic cough: Secondary | ICD-10-CM

## 2022-06-22 DIAGNOSIS — K219 Gastro-esophageal reflux disease without esophagitis: Secondary | ICD-10-CM

## 2022-06-22 DIAGNOSIS — R918 Other nonspecific abnormal finding of lung field: Secondary | ICD-10-CM | POA: Diagnosis not present

## 2022-06-22 DIAGNOSIS — J438 Other emphysema: Secondary | ICD-10-CM

## 2022-06-22 DIAGNOSIS — J310 Chronic rhinitis: Secondary | ICD-10-CM

## 2022-06-22 DIAGNOSIS — I2510 Atherosclerotic heart disease of native coronary artery without angina pectoris: Secondary | ICD-10-CM | POA: Diagnosis not present

## 2022-06-22 DIAGNOSIS — J9611 Chronic respiratory failure with hypoxia: Secondary | ICD-10-CM | POA: Diagnosis not present

## 2022-06-22 NOTE — Progress Notes (Signed)
   Subjective:    Patient ID: Taylor Burnett, female    DOB: 06/14/1951, 71 y.o.   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  She has chronic cough and chronic hypoxemic respiratory failure in the setting of these as well as rhinitis and GERD.  She is been dealing with more exertional dyspnea since she had COVID-19 in June 2022.  She is managed on [place] as well as Breztri.  At her last visit we tried increasing the Brovana up to twice daily but it perturbs her sleep. She mixes the brovana w ipratropium.   She does not tolerate prednisone well. Her breathing is stable - no significant change. O2 on 2L/min, uses POC from [employer], ? Whether it is working correctly, she is going to have it evaluated.   ROV 06/22/22 --71 year old woman with very severe COPD.  She is colonized with Mycobacterium gordonii and has associated pulmonary nodular disease, chronic cough.  She has chronic hypoxemic respiratory failure, GERD, rhinitis.  I treated her for an acute flare 05/16/2022 with prednisone and doxycycline. She didn't take the pred because it gives her suicidal ideation.  She is currently managed on Breztri as well with Brovana bid.  Using albuterol approximately 3-4x a day  Flonase.  No longer on pantoprazole She has a cough right now, has paroxysms and then becomes dyspneic.     Review of Systems  Respiratory:  Positive for shortness of breath.    As per HPI     Objective:   Physical Exam Vitals:   06/22/22 1612  BP: 128/72  Pulse: 87  SpO2: 92%  Weight: 134 lb 12.8 oz (61.1 kg)  Height: 5' (1.524 m)   Gen: Pleasant, well-nourished, in no distress,  normal affect  ENT: No lesions,  mouth clear,  oropharynx clear, no postnasal drip  Neck: No JVD, no stridor  Lungs: No use of accessory muscles, very distant, clear, no wheeze on normal breath.   Cardiovascular:  RRR, heart sounds normal, no murmur or gallops, no peripheral edema  Musculoskeletal: No deformities, no cyanosis or clubbing  Neuro: alert, non focal  Skin: Warm, no lesions or rashes      Assessment & Plan:  COPD (chronic obstructive pulmonary disease) with emphysema (HCC) Very severe disease.  Limited options here.  She is on maximal bronchodilator therapy, cannot tolerate prednisone, has GI upset when she is on chronic antibiotics.  Persistent symptoms of dyspnea and cough.  For now I think we have to continue the same regimen of breast tree and superimposed Brovana.   Chronic respiratory failure (HCC) Encouraged her to have good compliance with her supplemental oxygen.  She acknowledged that she is exerting herself without it at times.  She needs to wear it 24/7  GERD (gastroesophageal reflux disease) Not currently on therapy  Chronic rhinitis Flonase as ordered  Pulmonary nodules Suspect that these have been consistent with her Mycobacterium gordonae.  Last CT was 02/2021.  We will repeat a super D CT now to establish stability.  If there is an enlarging nodule, suspicion for malignancy then we will talk about possible diagnostics.  Levy Pupa, MD, PhD 06/22/2022, 4:45 PM [facility] [phone] or if no answer [phone]

## 2022-06-22 NOTE — Assessment & Plan Note (Signed)
Not currently on therapy 

## 2022-06-22 NOTE — Assessment & Plan Note (Signed)
Very severe disease.  Limited options here.  She is on maximal bronchodilator therapy, cannot tolerate prednisone, has GI upset when she is on chronic antibiotics.  Persistent symptoms of dyspnea and cough.  For now I think we have to continue the same regimen of breast tree and superimposed Brovana.

## 2022-06-22 NOTE — Patient Instructions (Signed)
We will repeat a CT scan of the chest to follow pulmonary nodules and inflammatory changes. Please continue Breztri 2 puffs twice a day.  Rinse and gargle after using. Please continue your Brovana, 1 nebulizer treatment twice daily. Continue albuterol either 2 puffs or 1 nebulizer treatment up to every 4 hours if needed for shortness of breath, chest tightness, wheezing. Continue fluticasone nasal spray. Follow Dr. Delton Coombes next available after your CT scan of the chest so we can review the results together.

## 2022-06-22 NOTE — Assessment & Plan Note (Signed)
Encouraged her to have good compliance with her supplemental oxygen.  She acknowledged that she is exerting herself without it at times.  She needs to wear it 24/7

## 2022-06-22 NOTE — Assessment & Plan Note (Signed)
Suspect that these have been consistent with her Mycobacterium gordonae.  Last CT was 02/2021.  We will repeat a super D CT now to establish stability.  If there is an enlarging nodule, suspicion for malignancy then we will talk about possible diagnostics.

## 2022-06-22 NOTE — Assessment & Plan Note (Signed)
Flonase as ordered

## 2022-07-03 ENCOUNTER — Other Ambulatory Visit: Payer: Self-pay | Admitting: Family Medicine

## 2022-07-03 ENCOUNTER — Encounter: Payer: Self-pay | Admitting: Family Medicine

## 2022-07-04 MED ORDER — GABAPENTIN 100 MG PO CAPS: 100.0000 mg | ORAL_CAPSULE | Freq: Three times a day (TID) | ORAL | 0 refills | Status: DC

## 2022-07-12 ENCOUNTER — Encounter: Payer: Self-pay | Admitting: Emergency Medicine

## 2022-07-12 DIAGNOSIS — R918 Other nonspecific abnormal finding of lung field: Secondary | ICD-10-CM

## 2022-07-13 NOTE — Telephone Encounter (Signed)
Pulmonary nodules Suspect that these have been consistent with her Mycobacterium gordonae.  Last CT was 02/2021.  We will repeat a super D CT now to establish stability.  If there is an enlarging nodule, suspicion for malignancy then we will talk about possible diagnostics.    Checked pt's last visit and do not see where an order had been placed for pt to have repeat CT. I have placed this order.

## 2022-08-04 ENCOUNTER — Other Ambulatory Visit: Payer: Self-pay | Admitting: Family Medicine

## 2022-08-04 MED ORDER — GABAPENTIN 100 MG PO CAPS
100.0000 mg | ORAL_CAPSULE | Freq: Three times a day (TID) | ORAL | 0 refills | Status: DC
Start: 2022-08-04 — End: 2022-09-01

## 2022-08-04 NOTE — Addendum Note (Signed)
Addended by: Arta Silence on: 08/04/2022 11:45 AM   Modules accepted: Orders

## 2022-08-04 NOTE — Telephone Encounter (Signed)
Requested medication (s) are due for refill today: yes  Requested medication (s) are on the active medication list: yes  Last refill:  07/04/22 #90 0 refills  Future visit scheduled: no   Notes to clinic:  do you want to continue refills?     Requested Prescriptions  Pending Prescriptions Disp Refills   gabapentin (NEURONTIN) 100 MG capsule [Pharmacy Med Name: GABAPENTIN 100 MG CAPSULE] 90 capsule 0    Sig: TAKE 1 CAPSULE BY MOUTH 3 TIMES DAILY.     Neurology: Anticonvulsants - gabapentin Failed - 08/04/2022  8:32 AM      Failed - Cr in normal range and within 360 days    Creat  Date Value Ref Range Status  07/20/2021 0.96 0.50 - 1.05 mg/dL Final         Passed - Completed PHQ-2 or PHQ-9 in the last 360 days      Passed - Valid encounter within last 12 months    Recent Outpatient Visits           4 months ago Acute pain of right shoulder   [facility] Pickard, Priscille Heidelberg, MD   1 year ago Neuropathy   [facility] Donita Brooks, MD   1 year ago Chest pain, unspecified type   [facility] Donita Brooks, MD   2 years ago Rib pain on left side   [facility] Donita Brooks, MD   2 years ago Low back pain at multiple sites   [facility] Pickard, Priscille Heidelberg, MD

## 2022-08-10 ENCOUNTER — Ambulatory Visit
Admission: RE | Admit: 2022-08-10 | Discharge: 2022-08-10 | Disposition: A | Discharge status: Home / Self Care | Payer: Medicare Other | Source: Ambulatory Visit | Attending: Emergency Medicine | Admitting: Emergency Medicine

## 2022-08-10 DIAGNOSIS — R0602 Shortness of breath: Secondary | ICD-10-CM | POA: Diagnosis not present

## 2022-08-10 DIAGNOSIS — R911 Solitary pulmonary nodule: Secondary | ICD-10-CM | POA: Diagnosis not present

## 2022-08-10 DIAGNOSIS — J439 Emphysema, unspecified: Secondary | ICD-10-CM | POA: Diagnosis not present

## 2022-08-10 DIAGNOSIS — I700 Atherosclerosis of aorta: Secondary | ICD-10-CM | POA: Diagnosis not present

## 2022-08-10 DIAGNOSIS — J449 Chronic obstructive pulmonary disease, unspecified: Secondary | ICD-10-CM | POA: Diagnosis not present

## 2022-08-10 DIAGNOSIS — R918 Other nonspecific abnormal finding of lung field: Secondary | ICD-10-CM

## 2022-08-15 ENCOUNTER — Other Ambulatory Visit: Payer: Self-pay | Admitting: Emergency Medicine

## 2022-08-31 ENCOUNTER — Encounter: Payer: Self-pay | Admitting: Family Medicine

## 2022-09-01 ENCOUNTER — Other Ambulatory Visit: Payer: Self-pay | Admitting: Family Medicine

## 2022-09-05 ENCOUNTER — Telehealth: Payer: Self-pay | Admitting: Emergency Medicine

## 2022-09-05 DIAGNOSIS — R918 Other nonspecific abnormal finding of lung field: Secondary | ICD-10-CM

## 2022-09-05 NOTE — Telephone Encounter (Signed)
Called patient and went over CT scan results. Nothing further needed

## 2022-09-07 ENCOUNTER — Encounter: Payer: Self-pay | Admitting: Emergency Medicine

## 2022-09-07 ENCOUNTER — Ambulatory Visit (INDEPENDENT_AMBULATORY_CARE_PROVIDER_SITE_OTHER): Payer: Medicare Other | Admitting: Emergency Medicine

## 2022-09-07 VITALS — BP 120/72 | HR 91 | Temp 97.4°F | Ht 61.0 in | Wt 132.6 lb

## 2022-09-07 DIAGNOSIS — I2510 Atherosclerotic heart disease of native coronary artery without angina pectoris: Secondary | ICD-10-CM | POA: Diagnosis not present

## 2022-09-07 DIAGNOSIS — R918 Other nonspecific abnormal finding of lung field: Secondary | ICD-10-CM | POA: Diagnosis not present

## 2022-09-07 DIAGNOSIS — Z23 Encounter for immunization: Secondary | ICD-10-CM | POA: Diagnosis not present

## 2022-09-07 DIAGNOSIS — J9611 Chronic respiratory failure with hypoxia: Secondary | ICD-10-CM

## 2022-09-07 DIAGNOSIS — J438 Other emphysema: Secondary | ICD-10-CM

## 2022-09-07 NOTE — Progress Notes (Signed)
   Subjective:    Patient ID: Taylor Burnett, female    DOB: 20-Aug-1951, 71 y.o.   MRN: [id number]  Shortness of Breath   ROV 06/22/22 --71 year old woman with very severe COPD.  She is colonized with Mycobacterium gordonii and has associated pulmonary nodular disease, chronic cough.  She has chronic hypoxemic respiratory failure, GERD, rhinitis.  I treated her for an acute flare 05/16/2022 with prednisone and doxycycline. She didn't take the pred because it gives her suicidal ideation.  She is currently managed on Breztri as well with Brovana bid.  Using albuterol approximately 3-4x a day  Flonase.  No longer on pantoprazole She has a cough right now, has paroxysms and then becomes dyspneic.   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  She has chronic hypoxemic respiratory failure she has been managed on Breztri as well as Brovana twice daily.  Ipratropium nebs as well. She uses albuterol approximately 4x a day. Her O2 is 2L/min.   CT chest performed on 08/10/2022 reviewed by me showed moderate centrilobular emphysema, stable lateral right middle lobe nodule 1 cm, stable (definitively benign) 5 mm left lower lobe nodule.    Review of Systems  Respiratory:  Positive for shortness of breath.    As per HPI     Objective:   Physical Exam Vitals:   09/07/22 1548  BP: 120/72  Pulse: 91  Temp: (!) 97.4 F (36.3 C)  TempSrc: Oral  SpO2: 92%  Weight: 132 lb 9.6 oz (60.1 kg)  Height: 5\' 1"  (1.549 m)   Gen: Pleasant, well-nourished, in no distress,  normal affect  ENT: No lesions,  mouth clear,  oropharynx clear, no postnasal drip  Neck: No JVD, no stridor  Lungs: No use of accessory muscles, very distant, clear, no wheeze on normal breath.   Cardiovascular: RRR, heart sounds normal, no murmur or gallops, no peripheral edema  Musculoskeletal: No  deformities, no cyanosis or clubbing  Neuro: alert, non focal  Skin: Warm, no lesions or rashes      Assessment & Plan:  Pulmonary nodules Pulmonary nodules stable on her most recent CT.  Plan to repeat in 6 months.  Reviewed with her.  We will move your CT scan of the chest up to February 2024 and will try to get this done at an alternative location so you do not have to go to [facility] Follow [facility] in February after your CT or sooner if you have any problems  COPD (chronic obstructive pulmonary disease) with emphysema (HCC) Continue your Breztri 2 puffs twice a day.  Rinse and gargle after using Continue Brovana nebulizer twice a day Continue ipratropium nebulizer 1-2 times a day Keep your albuterol available to use 2 puffs up to every 4 hours if needed for shortness of breath, chest tightness, wheezing. Get the flu shot this fall You would benefit from getting the COVID-19 vaccine this fall   Chronic respiratory failure (HCC) Continue oxygen at 2 L/min  March, MD, PhD 09/07/2022, 4:06 PM [place] [facility] [phone] or if no answer [phone]

## 2022-09-07 NOTE — Assessment & Plan Note (Signed)
Continue oxygen at 2 L/min 

## 2022-09-07 NOTE — Assessment & Plan Note (Signed)
Continue your Breztri 2 puffs twice a day.  Rinse and gargle after using Continue Brovana nebulizer twice a day Continue ipratropium nebulizer 1-2 times a day Keep your albuterol available to use 2 puffs up to every 4 hours if needed for shortness of breath, chest tightness, wheezing. Get the flu shot this fall You would benefit from getting the COVID-19 vaccine this fall

## 2022-09-07 NOTE — Assessment & Plan Note (Signed)
Pulmonary nodules stable on her most recent CT.  Plan to repeat in 6 months.  Reviewed with her.  We will move your CT scan of the chest up to February 2024 and will try to get this done at an alternative location so you do not have to go to [facility] Follow Dr. Delton Coombes in February after your CT or sooner if you have any problems

## 2022-09-07 NOTE — Patient Instructions (Signed)
We will move your CT scan of the chest up to February 2024 and will try to get this done at an alternative location so you do not have to go to [facility] 2 puffs twice a day.  Rinse and gargle after using Continue Brovana nebulizer twice a day Continue ipratropium nebulizer 1-2 times a day Keep your albuterol available to use 2 puffs up to every 4 hours if needed for shortness of breath, chest tightness, wheezing. Continue oxygen at 2 L/min Get the flu shot this fall You would benefit from getting the COVID-19 vaccine this fall Follow Dr. Delton Coombes in February after your CT or sooner if you have any problems

## 2022-09-29 ENCOUNTER — Telehealth: Payer: Self-pay | Admitting: Emergency Medicine

## 2022-10-03 ENCOUNTER — Encounter: Payer: Self-pay | Admitting: Emergency Medicine

## 2022-10-11 ENCOUNTER — Other Ambulatory Visit: Payer: Self-pay | Admitting: Family Medicine

## 2022-10-11 NOTE — Telephone Encounter (Signed)
Requested medications are due for refill today.  yes  Requested medications are on the active medications list.  yes  Last refill. 09/01/2022 #90 0 rf  Future visit scheduled.   no  Notes to clinic.  Missing lab.    Requested Prescriptions  Pending Prescriptions Disp Refills   gabapentin (NEURONTIN) 100 MG capsule [Pharmacy Med Name: GABAPENTIN 100 MG CAPSULE] 90 capsule 0    Sig: TAKE 1 CAPSULE BY MOUTH 3 TIMES DAILY.     Neurology: Anticonvulsants - gabapentin Failed - 10/11/2022  8:15 AM      Failed - Cr in normal range and within 360 days    Creat  Date Value Ref Range Status  07/20/2021 0.96 0.50 - 1.05 mg/dL Final         Passed - Completed PHQ-2 or PHQ-9 in the last 360 days      Passed - Valid encounter within last 12 months    Recent Outpatient Visits           7 months ago Acute pain of right shoulder   [place] Pickard, Cammie Mcgee, MD   1 year ago Neuropathy   [place] Susy Frizzle, MD   2 years ago Chest pain, unspecified type   [place] Susy Frizzle, MD   2 years ago Rib pain on left side   Heidelberg, Warren T, MD   3 years ago Low back pain at multiple sites   [place], Cammie Mcgee, MD

## 2022-10-12 NOTE — Telephone Encounter (Signed)
Pt called requesting a refill on her Gabapentin. Last RF was

## 2022-10-13 ENCOUNTER — Other Ambulatory Visit: Payer: Self-pay | Admitting: Family Medicine

## 2022-10-13 MED ORDER — GABAPENTIN 100 MG PO CAPS: 100.0000 mg | ORAL_CAPSULE | Freq: Three times a day (TID) | ORAL | 1 refills | Status: DC

## 2022-10-20 ENCOUNTER — Encounter: Payer: Self-pay | Admitting: Emergency Medicine

## 2022-10-21 MED ORDER — CLARITHROMYCIN 500 MG PO TABS: 500.0000 mg | ORAL_TABLET | Freq: Two times a day (BID) | ORAL | 0 refills | Status: DC

## 2022-10-21 NOTE — Telephone Encounter (Signed)
Please have her take clarithromycin 500mg  bid x 7 days.  She needs to call us and/or seek care in ED if not improving

## 2022-10-21 NOTE — Telephone Encounter (Signed)
Dr. Byrum, please see mychart messages sent by pt and advise. 

## 2022-10-26 NOTE — Telephone Encounter (Signed)
Patient's CT has already been changed to GI.

## 2022-11-30 ENCOUNTER — Other Ambulatory Visit: Payer: Self-pay | Admitting: Emergency Medicine

## 2022-12-02 ENCOUNTER — Ambulatory Visit (INDEPENDENT_AMBULATORY_CARE_PROVIDER_SITE_OTHER): Payer: Medicare Other

## 2022-12-02 DIAGNOSIS — Z0000 Encounter for general adult medical examination without abnormal findings: Secondary | ICD-10-CM

## 2022-12-02 NOTE — Progress Notes (Signed)
Subjective:   Taylor Burnett is a 71 y.o. female who presents for Medicare Annual (Subsequent) preventive examination.  Review of Systems     Cardiac Risk Factors include: advanced age (>66men, >42 women)     Objective:    Today's Vitals   12/02/22 1527  PainSc: 10-Worst pain ever   There is no height or weight on file to calculate BMI.     12/02/2022    3:40 PM 03/23/2022    1:04 PM 11/05/2021    1:34 PM 10/29/2021    9:58 AM 03/25/2021    6:54 AM 04/29/2019    4:42 PM 12/31/2018    6:39 AM  Advanced Directives  Does Patient Have a Medical Advance Directive? No No No No No No No  Would patient like information on creating a medical advance directive?  Yes (MAU/Ambulatory/Procedural Areas - Information given) No - Patient declined No - Patient declined No - Patient declined Yes (ED - Information included in AVS) Yes (MAU/Ambulatory/Procedural Areas - Information given)    Current Medications (verified) Outpatient Encounter Medications as of 12/02/2022  Medication Sig   albuterol (VENTOLIN HFA) 108 (90 Base) MCG/ACT inhaler INHALE 2 PUFFS INTO THE LUNGS EVERY 6 HOURS AS NEEDED.   arformoterol (BROVANA) 15 MCG/2ML NEBU USE 1 VIAL  IN  NEBULIZER TWICE  DAILY - morning and evening   aspirin EC 81 MG tablet Take 1 tablet (81 mg total) by mouth daily. Swallow whole.   atorvastatin (LIPITOR) 80 MG tablet Take 1 tablet (80 mg total) by mouth daily.   Budeson-Glycopyrrol-Formoterol (BREZTRI AEROSPHERE) 160-9-4.8 MCG/ACT AERO INHALE (2) PUFFS INTO THE LUNGS IN THE MORNING AND AT BEDTIME   calcium carbonate (CALCIUM 600) 600 MG TABS tablet Take 2 tablets (1,200 mg total) by mouth daily with breakfast.   Cholecalciferol (VITAMIN D) 2000 units CAPS Take 2,000 Units by mouth daily.    clarithromycin (BIAXIN) 500 MG tablet Take 1 tablet (500 mg total) by mouth 2 (two) times daily.   diltiazem (CARDIZEM CD) 180 MG 24 hr capsule Take 1 capsule (180 mg total) by mouth daily.   fluticasone  (FLONASE) 50 MCG/ACT nasal spray Place 2 sprays into both nostrils every evening.   gabapentin (NEURONTIN) 100 MG capsule Take 1 capsule (100 mg total) by mouth 3 (three) times daily.   ipratropium (ATROVENT) 0.02 % nebulizer solution USE 1 VIAL IN NEBULIZER 3 TIMES DAILY   naproxen sodium (ALEVE) 220 MG tablet Take 440 mg by mouth 2 (two) times daily as needed (pain).    OXYGEN Inhale 2 L into the lungs as needed. FOR USE WITH EXERTION   Facility-Administered Encounter Medications as of 12/02/2022  Medication   denosumab (PROLIA) injection 60 mg    Allergies (verified) Prednisone and Toprol xl [metoprolol]   History: Past Medical History:  Diagnosis Date   Arthritis    CAD (coronary artery disease), native coronary artery    mild non obstructive CAD of the RCA, LCX, LAD up to 25-49% by coronary CTA 11/2020   Cataract    COPD (chronic obstructive pulmonary disease) (HCC)    Cough    DDD (degenerative disc disease), cervical    Dysrhythmia    RAPID HEART BEAT OCC    Emphysema of lung (HCC)    GERD (gastroesophageal reflux disease)    OCC   H/O degenerative disc disease    Headache    Myocardial infarction (HCC) 2016   Osteoporosis    Panic attacks    PAT (paroxysmal  atrial tachycardia) (HCC)    noted on event monitor 11/2020   Pre-diabetes    Severe lung disease    Shortness of breath dyspnea    Sleep apnea    Spondylosis    Past Surgical History:  Procedure Laterality Date   ANTERIOR CERVICAL DECOMP/DISCECTOMY FUSION N/A 03/13/2015   Procedure: CERVICAL THREE-CERVICAL FOUR, CERVICAL FOUR-CERVICAL FIVE, CERVICAL FIVE-CERVICAL SIX ANTERIOR CERVICAL DECOMPRESSION/DISCECTOMY FUSION 3 LEVELS;  Surgeon: Tia Alert, MD;  Location: [facility];  Service: Neurosurgery;  Laterality: N/A;   BIOPSY  03/25/2021   Procedure: BIOPSY;  Surgeon: Rachael Fee, MD;  Location: [facility];  Service: Endoscopy;;   BREAST ENHANCEMENT SURGERY  1990   CARDIAC CATHETERIZATION  1995    CATARACT EXTRACTION W/PHACO Right 05/02/2017   Procedure: CATARACT EXTRACTION PHACO AND INTRAOCULAR LENS PLACEMENT (IOC);  Surgeon: Jethro Bolus, MD;  Location: [facility];  Service: Ophthalmology;  Laterality: Right;  CDE: 6.22   CATARACT EXTRACTION W/PHACO Left 05/16/2017   Procedure: CATARACT EXTRACTION PHACO AND INTRAOCULAR LENS PLACEMENT (IOC);  Surgeon: Jethro Bolus, MD;  Location: [facility];  Service: Ophthalmology;  Laterality: Left;  CDE: 4.10   ESOPHAGOGASTRODUODENOSCOPY (EGD) WITH PROPOFOL N/A 03/25/2021   Procedure: ESOPHAGOGASTRODUODENOSCOPY (EGD) WITH PROPOFOL;  Surgeon: Rachael Fee, MD;  Location: [facility];  Service: Endoscopy;  Laterality: N/A;   TONSILLECTOMY     TUBAL LIGATION     VIDEO BRONCHOSCOPY Bilateral 12/31/2018   Procedure: VIDEO BRONCHOSCOPY WITHOUT FLUORO;  Surgeon: Leslye Peer, MD;  Location: [facility];  Service: Cardiopulmonary;  Laterality: Bilateral;   Family History  Problem Relation Age of Onset   Emphysema Father    Allergies Mother    Allergies Son    Allergies Brother    Heart disease Maternal Grandmother    Rheum arthritis Brother    Cancer Other        aunts and uncles   Social History   Socioeconomic History   Marital status: Divorced    Spouse name: Not on file   Number of children: 3   Years of education: Not on file   Highest education level: Not on file  Occupational History   Occupation: on disability.  Tobacco Use   Smoking status: Former    Packs/day: 2.00    Years: 40.00    Total pack years: 80.00    Types: Cigarettes    Quit date: 07/31/2013    Years since quitting: 9.3   Smokeless tobacco: Never   Tobacco comments:    Would like Lung Ca Screening, quit in 2014. 10/29/2021  Vaping Use   Vaping Use: Never used  Substance and Sexual Activity   Alcohol use: No    Comment: OCC WINE    Drug use: No   Sexual activity: Not on file  Other Topics Concern   Not on file  Social History Narrative   Divorced and lives alone.     2 sons and 1 daughter   6 grandchildren.    Social Determinants of Health   Financial Resource Strain: Low Risk  (12/02/2022)   Overall Financial Resource Strain (CARDIA)    Difficulty of Paying Living Expenses: Not very hard  Food Insecurity: No Food Insecurity (12/02/2022)   Hunger Vital Sign    Worried About Running Out of Food in the Last Year: Never true    Ran Out of Food in the Last Year: Never true  Transportation Needs: No Transportation Needs (12/02/2022)   PRAPARE - Transportation    Lack  of Transportation (Medical): No    Lack of Transportation (Non-Medical): No  Physical Activity: Inactive (12/02/2022)   Exercise Vital Sign    Days of Exercise per Week: 0 days    Minutes of Exercise per Session: 0 min  Stress: No Stress Concern Present (12/02/2022)   [employer] of Occupational Health - Occupational Stress Questionnaire    Feeling of Stress : Not at all  Social Connections: Socially Isolated (12/02/2022)   Social Connection and Isolation Panel [NHANES]    Frequency of Communication with Friends and Family: More than three times a week    Frequency of Social Gatherings with Friends and Family: More than three times a week    Attends Religious Services: Never    Database administrator or Organizations: No    Attends Banker Meetings: Never    Marital Status: Divorced    Tobacco Counseling Counseling given: Not Answered Tobacco comments: Would like Lung Ca Screening, quit in 2014. 10/29/2021   Clinical Intake:  Pre-visit preparation completed: Yes  Pain : 0-10 (R-arm and neck pain) Pain Score: 10-Worst pain ever Pain Type: Chronic pain Pain Location: Neck Pain Orientation: Right Pain Descriptors / Indicators: Aching, Constant, Sharp Pain Onset: More than a month ago Pain Frequency: Constant Pain Relieving Factors: take alieve Effect of Pain on Daily Activities: yes, feel pain all the other to finger tips, can use arm  Pain Relieving  Factors: take alieve  Nutritional Status: BMI 25 -29 Overweight Diabetes: No  How often do you need to have someone help you when you read instructions, pamphlets, or other written materials from your doctor or pharmacy?: 1 - Never What is the last grade level you completed in school?: 12  Diabetic?n/a  Interpreter Needed?: No      Activities of Daily Living    12/02/2022    3:35 PM  In your present state of health, do you have any difficulty performing the following activities:  Hearing? 0  Vision? 0  Difficulty concentrating or making decisions? 0  Walking or climbing stairs? 0  Dressing or bathing? 1  Doing errands, shopping? 1  Preparing Food and eating ? N  Using the Toilet? N  In the past six months, have you accidently leaked urine? N  Do you have problems with loss of bowel control? N  Managing your Medications? Y  Managing your Finances? Y  Housekeeping or managing your Housekeeping? Y    Patient Care Team: Donita Brooks, MD as PCP - General (Family Medicine) Quintella Reichert, MD as PCP - Cardiology (Cardiology) Erroll Luna, [facility] (Pharmacist)  Indicate any recent Medical Services you may have received from other than [facility] providers in the past year (date may be approximate).     Assessment:   This is a routine wellness examination for Duncan.  Hearing/Vision screen No results found.  Dietary issues and exercise activities discussed: Exercise limited by: orthopedic condition(s);respiratory conditions(s);cardiac condition(s)   Goals Addressed   None    Depression Screen    12/02/2022    3:35 PM 11/05/2021    1:29 PM 10/29/2021    9:54 AM 07/20/2021   12:45 PM 06/19/2017    1:24 PM 01/23/2017    1:18 PM 07/21/2016    1:30 PM  PHQ 2/9 Scores  PHQ - 2 Score 0 0 0 3 0 0 0  PHQ- 9 Score    10       Fall Risk    11/05/2021  1:29 PM 10/29/2021    9:58 AM 07/20/2021   12:45 PM 06/19/2017    1:24 PM 01/23/2017    1:18 PM  Fall Risk   Falls  in the past year? 0 0 0 No No  Number falls in past yr:  0 0    Injury with Fall?  0 0    Risk for fall due to :  Impaired mobility;Other (Comment);Impaired balance/gait No Fall Risks    Risk for fall due to: Comment  COPD     Follow up  Falls prevention discussed Falls evaluation completed      FALL RISK PREVENTION PERTAINING TO THE HOME:  Any stairs in or around the home? Yes  If so, are there any without handrails? No  Home free of loose throw rugs in walkways, pet beds, electrical cords, etc? Yes  Adequate lighting in your home to reduce risk of falls? Yes   ASSISTIVE DEVICES UTILIZED TO PREVENT FALLS:  Life alert? Yes  Use of a cane, walker or w/c? No  Grab bars in the bathroom? Yes  Shower chair or bench in shower? Yes  Elevated toilet seat or a handicapped toilet? No   TIMED UP AND GO:  Was the test performed? No .  Length of time to ambulate 10 feet: n/a sec.     Cognitive Function:        12/02/2022    3:37 PM 10/29/2021   10:01 AM  6CIT Screen  What Year? 0 points 0 points  What month? 0 points 0 points  What time? 0 points 0 points  Count back from 20 0 points 0 points  Months in reverse 0 points 0 points  Repeat phrase 0 points 0 points  Total Score 0 points 0 points    Immunizations Immunization History  Administered Date(s) Administered   Fluad Quad(high Dose 65+) 11/12/2021, 09/07/2022   PFIZER Comirnaty(Gray Top)Covid-19 Tri-Sucrose Vaccine 01/28/2021   PFIZER(Purple Top)SARS-COV-2 Vaccination 01/07/2021   Pneumococcal Conjugate-13 07/30/2019   Pneumococcal Polysaccharide-23 04/20/2018   Tdap 06/06/2016    TDAP status: Up to date  Flu Vaccine status: Up to date  Pneumococcal vaccine status: Up to date  Covid-19 vaccine status: Declined, Education has been provided regarding the importance of this vaccine but patient still declined. Advised may receive this vaccine at local pharmacy or Health Dept.or vaccine clinic. Aware to provide a copy  of the vaccination record if obtained from local pharmacy or Health Dept. Verbalized acceptance and understanding.  Qualifies for Shingles Vaccine? No   Zostavax completed No   Shingrix Completed?: No.    Education has been provided regarding the importance of this vaccine. Patient has been advised to call insurance company to determine out of pocket expense if they have not yet received this vaccine. Advised may also receive vaccine at local pharmacy or Health Dept. Verbalized acceptance and understanding.  Screening Tests Health Maintenance  Topic Date Due   Zoster Vaccines- Shingrix (1 of 2) Never done   MAMMOGRAM  06/14/2020   COVID-19 Vaccine (3 - 2023-24 season) 08/26/2022   Lung Cancer Screening  08/11/2023   Medicare Annual Wellness (AWV)  12/03/2023   Fecal DNA (Cologuard)  07/15/2024   DTaP/Tdap/Td (2 - Td or Tdap) 06/06/2026   Pneumonia Vaccine 4+ Years old  Completed   INFLUENZA VACCINE  Completed   DEXA SCAN  Completed   Hepatitis C Screening  Completed   HPV VACCINES  Aged Out    Health Maintenance  Health Maintenance Due  Topic Date Due   Zoster Vaccines- Shingrix (1 of 2) Never done   MAMMOGRAM  06/14/2020   COVID-19 Vaccine (3 - 2023-24 season) 08/26/2022    Colorectal cancer screening: Type of screening: Cologuard. Completed 8/23. Repeat every 1 years  Mammogram status: Ordered Need to schedule. Pt provided with contact info and advised to call to schedule appt.   Bone Density status: Completed 4/23. Results reflect: Bone density results: OSTEOPOROSIS. Repeat every 1 years.  Lung Cancer Screening: (Low Dose CT Chest recommended if Age 76-80 years, 30 pack-year currently smoking OR have quit w/in 15years.) does not qualify.   Lung Cancer Screening Referral: n/a  Additional Screening:  Hepatitis C Screening: does not qualify; Completed n/a  Vision Screening: Recommended annual ophthalmology exams for early detection of glaucoma and other disorders of the  eye. Is the patient up to date with their annual eye exam?  Yes  Who is the provider or what is the name of the office in which the patient attends annual eye exams? Dr. Nile Riggs If pt is not established with a provider, would they like to be referred to a provider to establish care? No .   Dental Screening: Recommended annual dental exams for proper oral hygiene  Community Resource Referral / Chronic Care Management: CRR required this visit?  No   CCM required this visit?  No      Plan:     I have personally reviewed and noted the following in the patient's chart:   Medical and social history Use of alcohol, tobacco or illicit drugs  Current medications and supplements including opioid prescriptions. Patient is not currently taking opioid prescriptions. Functional ability and status Nutritional status Physical activity Advanced directives List of other physicians Hospitalizations, surgeries, and ER visits in previous 12 months Vitals Screenings to include cognitive, depression, and falls Referrals and appointments  In addition, I have reviewed and discussed with patient certain preventive protocols, quality metrics, and best practice recommendations. A written personalized care plan for preventive services as well as general preventive health recommendations were provided to patient.     Arta Silence, CMA   12/02/2022   Nurse Notes: n/a

## 2022-12-05 ENCOUNTER — Ambulatory Visit (INDEPENDENT_AMBULATORY_CARE_PROVIDER_SITE_OTHER): Payer: Medicare Other | Admitting: Orthopedic Surgery

## 2022-12-05 ENCOUNTER — Ambulatory Visit: Payer: Self-pay

## 2022-12-05 DIAGNOSIS — M25511 Pain in right shoulder: Secondary | ICD-10-CM | POA: Diagnosis not present

## 2022-12-05 DIAGNOSIS — I2510 Atherosclerotic heart disease of native coronary artery without angina pectoris: Secondary | ICD-10-CM

## 2022-12-05 DIAGNOSIS — M7551 Bursitis of right shoulder: Secondary | ICD-10-CM | POA: Diagnosis not present

## 2022-12-07 ENCOUNTER — Other Ambulatory Visit: Payer: Self-pay

## 2022-12-07 DIAGNOSIS — M5412 Radiculopathy, cervical region: Secondary | ICD-10-CM

## 2022-12-08 ENCOUNTER — Encounter: Payer: Self-pay | Admitting: Orthopedic Surgery

## 2022-12-08 MED ORDER — METHYLPREDNISOLONE ACETATE 40 MG/ML IJ SUSP
40.0000 mg | INTRAMUSCULAR | Status: AC | PRN
  Administered 2022-12-05: 40 mg via INTRA_ARTICULAR

## 2022-12-08 MED ORDER — LIDOCAINE HCL 1 % IJ SOLN
5.0000 mL | INTRAMUSCULAR | Status: AC | PRN
  Administered 2022-12-05: 5 mL

## 2022-12-08 MED ORDER — BUPIVACAINE HCL 0.5 % IJ SOLN
9.0000 mL | INTRAMUSCULAR | Status: AC | PRN
  Administered 2022-12-05: 9 mL via INTRA_ARTICULAR

## 2022-12-08 NOTE — Progress Notes (Addendum)
Office Visit Note   Patient: Taylor Burnett           Date of Birth: 18-Apr-1951           MRN: [id number] Visit Date: 12/05/2022 Requested by: Donita Brooks, MD [address] [place],  [place] [address] PCP: Donita Brooks, MD  Subjective: Chief Complaint  Patient presents with   Right Shoulder - Pain   Left Shoulder - Pain   Neck - Pain    HPI: Taylor Burnett is a 71 y.o. female who presents to the office reporting severe bilateral arm pain right worse than left.  She is right-hand dominant.  Describes constant pain with aching and throbbing.  The pain wakes her from sleep at night.  At times she is unable to use her arms.  She also reports decreased range of motion in the right upper extremity.  Describes numbness and tingling in her arms down to her fingers along with neck pain.  Uses Aleve for symptoms.  She has known bilateral shoulder rotator cuff arthropathy.  She has had injections into the right shoulder which have helped some.  The left one is a little bit better she cannot really go on with the right arm and shoulder feeling the way that they do.  She has had an MRI scan of her cervical spine which shows degenerative changes and enough stenosis to be causing some of her symptoms..                ROS: All systems reviewed are negative as they relate to the chief complaint within the history of present illness.  Patient denies fevers or chills.  Assessment & Plan: Visit Diagnoses:  1. Right shoulder pain, unspecified chronicity     Plan: Impression is bilateral shoulder pain right worse than left with radiculopathy.  Hurts her to raise the right arm.  Does have numbness and tingling in the right arm which is relatively constant.  Notes were reviewed and the last injection into the shoulder joint was in May.  Plan is glenohumeral joint injection today under ultrasound guidance and referral to Dr. Alvester Morin for cervical spine ESI for bilateral radiculopathy right worse  than left  Follow-Up Instructions: No follow-ups on file.   Orders:  Orders Placed This Encounter  Procedures   US Guided Needle Placement - No Linked Charges   No orders of the defined types were placed in this encounter.     Procedures: Large Joint Inj: R glenohumeral on 12/05/2022 7:01 AM Indications: diagnostic evaluation and pain Details: 18 G 1.5 in needle, ultrasound-guided posterior approach  Arthrogram: No  Medications: 9 mL bupivacaine 0.5 %; 40 mg methylPREDNISolone acetate 40 MG/ML; 5 mL lidocaine 1 % Outcome: tolerated well, no immediate complications Procedure, treatment alternatives, risks and benefits explained, specific risks discussed. Consent was given by the patient. Immediately prior to procedure a time out was called to verify the correct patient, procedure, equipment, support staff and site/side marked as required. Patient was prepped and draped in the usual sterile fashion.       Clinical Data: No additional findings.  Objective: Vital Signs: There were no vitals taken for this visit.  Physical Exam:  Constitutional: Patient appears well-developed HEENT:  Head: Normocephalic Eyes:EOM are normal Neck: Normal range of motion Cardiovascular: Normal rate Pulmonary/chest: Effort normal Neurologic: Patient is alert Skin: Skin is warm Psychiatric: Patient has normal mood and affect  Ortho Exam: Ortho exam demonstrates pretty reasonable cervical  spine range of motion.  5 out of 5 grip EPL FPL interosseous wrist flexion extension bicep triceps and deltoid strength.  No definite paresthesias C5-T1.  Does have some coarseness with internal/external rotation at 90 degrees of abduction in both shoulders.  Deltoid is functional.  Rotator cuff strength is diminished more on the right than the left.  Radial pulse intact bilaterally.  No masses lymphadenopathy or skin changes noted in the shoulder girdle region  Specialty Comments:  MRI CERVICAL SPINE WITHOUT  CONTRAST   TECHNIQUE: Multiplanar, multisequence MR imaging of the cervical spine was performed. No intravenous contrast was administered.   COMPARISON:  Cervical spine MRI 10/15/2019   FINDINGS: Alignment: Chronic straightening of the normal cervical lordosis. Unchanged grade 1 anterolisthesis of C2 on C3, C7 on T1, T1 on T2, and T2 on T3.   Vertebrae: No fracture, suspicious marrow lesion, or significant marrow edema. Prior C3-C6 ACDF. Facet ankylosis at C3-4 and C5-6.   Cord: Normal signal.   Posterior Fossa, vertebral arteries, paraspinal tissues: Unremarkable.   Disc levels:   C2-3: Anterolisthesis with mild bulging of uncovered disc, infolding of the ligamentum flavum, uncovertebral spurring, and severe facet arthrosis result in moderate spinal stenosis and moderate left greater than right neural foraminal stenosis, mildly progressed.   C3-4 through C5-6: ACDF.  No significant residual stenosis.   C6-7: Severe disc space narrowing. Broad-based posterior disc osteophyte complex and moderate facet arthrosis result in mild spinal stenosis and mild right and moderate left neural foraminal stenosis, unchanged.   C7-T1: Anterolisthesis with slight bulging of uncovered disc and mild right and severe left facet arthrosis result in mild left neural foraminal stenosis without spinal stenosis, unchanged.   T1-2: Anterolisthesis with disc uncovering and mild-to-moderate facet arthrosis without stenosis.   IMPRESSION: 1. Severe C2-3 facet arthrosis with moderate spinal stenosis and moderate left greater than right neural foraminal stenosis, mildly progressed. 2. Unchanged mild spinal stenosis and moderate left foraminal stenosis at C6-7. 3. C3-C6 ACDF without significant residual stenosis.     Electronically Signed   By: Sebastian Ache M.D.   On: 04/26/2022 13:15  Imaging: No results found.   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   Chronic cough 10/22/2018   GERD (gastroesophageal reflux disease) 07/20/2018   Anxiety 06/21/2017   Prediabetes 06/21/2017   Chronic respiratory failure (HCC) 04/18/2017   Other insomnia 01/23/2017   COPD (chronic obstructive pulmonary disease) with emphysema (HCC) 01/16/2017   Sleep related hypoxia 01/16/2017   Sleep apnea in adult 07/11/2016   Chronic rhinitis 01/05/2016   S/P cervical spinal fusion 03/13/2015   Hemoptysis 01/01/2014   Atypical chest pain 06/08/2012   Neutropenia 12/23/2011   Thrombocytopenia (HCC) 12/22/2011   Hypoxemia 12/19/2011   Past Medical History:  Diagnosis Date   Arthritis    CAD (coronary artery disease), native coronary artery    mild non obstructive CAD of the RCA, LCX, LAD up to 25-49% by coronary CTA 11/2020   Cataract    COPD (chronic obstructive pulmonary disease) (HCC)    Cough    DDD (degenerative disc disease), cervical    Dysrhythmia    RAPID HEART BEAT OCC    Emphysema of lung (HCC)    GERD (gastroesophageal reflux disease)    OCC   H/O degenerative disc disease  Headache    Myocardial infarction (HCC) 2016   Osteoporosis    Panic attacks    PAT (paroxysmal atrial tachycardia)    noted on event monitor 11/2020   Pre-diabetes    Severe lung disease    Shortness of breath dyspnea    Sleep apnea    Spondylosis     Family History  Problem Relation Age of Onset   Emphysema Father    Allergies Mother    Allergies Son    Allergies Brother    Heart disease Maternal Grandmother    Rheum arthritis Brother    Cancer Other        aunts and uncles    Past Surgical History:  Procedure Laterality Date   ANTERIOR CERVICAL DECOMP/DISCECTOMY FUSION N/A 03/13/2015   Procedure: CERVICAL THREE-CERVICAL FOUR, CERVICAL FOUR-CERVICAL FIVE, CERVICAL FIVE-CERVICAL SIX  ANTERIOR CERVICAL DECOMPRESSION/DISCECTOMY FUSION 3 LEVELS;  Surgeon: Tia Alert, MD;  Location: [facility];  Service: Neurosurgery;  Laterality: N/A;   BIOPSY  03/25/2021   Procedure: BIOPSY;  Surgeon: Rachael Fee, MD;  Location: [facility];  Service: Endoscopy;;   BREAST ENHANCEMENT SURGERY  1990   CARDIAC CATHETERIZATION  1995   CATARACT EXTRACTION W/PHACO Right 05/02/2017   Procedure: CATARACT EXTRACTION PHACO AND INTRAOCULAR LENS PLACEMENT (IOC);  Surgeon: Jethro Bolus, MD;  Location: [facility];  Service: Ophthalmology;  Laterality: Right;  CDE: 6.22   CATARACT EXTRACTION W/PHACO Left 05/16/2017   Procedure: CATARACT EXTRACTION PHACO AND INTRAOCULAR LENS PLACEMENT (IOC);  Surgeon: Jethro Bolus, MD;  Location: [facility];  Service: Ophthalmology;  Laterality: Left;  CDE: 4.10   ESOPHAGOGASTRODUODENOSCOPY (EGD) WITH PROPOFOL N/A 03/25/2021   Procedure: ESOPHAGOGASTRODUODENOSCOPY (EGD) WITH PROPOFOL;  Surgeon: Rachael Fee, MD;  Location: [facility];  Service: Endoscopy;  Laterality: N/A;   TONSILLECTOMY     TUBAL LIGATION     VIDEO BRONCHOSCOPY Bilateral 12/31/2018   Procedure: VIDEO BRONCHOSCOPY WITHOUT FLUORO;  Surgeon: Leslye Peer, MD;  Location: [facility];  Service: Cardiopulmonary;  Laterality: Bilateral;   Social History   Occupational History   Occupation: on disability.  Tobacco Use   Smoking status: Former    Packs/day: 2.00    Years: 40.00    Total pack years: 80.00    Types: Cigarettes    Quit date: 07/31/2013    Years since quitting: 9.3   Smokeless tobacco: Never   Tobacco comments:    Would like Lung Ca Screening, quit in 2014. 10/29/2021  Vaping Use   Vaping Use: Never used  Substance and Sexual Activity   Alcohol use: No    Comment: OCC WINE    Drug use: No   Sexual activity: Not on file

## 2022-12-11 ENCOUNTER — Encounter: Payer: Self-pay | Admitting: Emergency Medicine

## 2022-12-12 ENCOUNTER — Other Ambulatory Visit: Payer: Self-pay | Admitting: Emergency Medicine

## 2022-12-12 MED ORDER — BREZTRI AEROSPHERE 160-9-4.8 MCG/ACT IN AERO: 2.0000 | INHALATION_SPRAY | Freq: Two times a day (BID) | RESPIRATORY_TRACT | 11 refills | Status: DC

## 2023-01-16 ENCOUNTER — Other Ambulatory Visit: Payer: Self-pay | Admitting: Family Medicine

## 2023-01-16 DIAGNOSIS — Z1231 Encounter for screening mammogram for malignant neoplasm of breast: Secondary | ICD-10-CM

## 2023-01-23 ENCOUNTER — Other Ambulatory Visit: Payer: Self-pay | Admitting: Family Medicine

## 2023-01-23 MED ORDER — GABAPENTIN 100 MG PO CAPS: 100.0000 mg | ORAL_CAPSULE | Freq: Three times a day (TID) | ORAL | 1 refills | Status: DC

## 2023-01-24 NOTE — Telephone Encounter (Signed)
Requested Prescriptions  Pending Prescriptions Disp Refills   gabapentin (NEURONTIN) 100 MG capsule [Pharmacy Med Name: GABAPENTIN 100 MG CAPSULE] 90 capsule 0    Sig: TAKE 1 CAPSULE BY MOUTH 3 TIMES DAILY.     Neurology: Anticonvulsants - gabapentin Failed - 01/23/2023  8:33 AM      Failed - Cr in normal range and within 360 days    Creat  Date Value Ref Range Status  07/20/2021 0.96 0.50 - 1.05 mg/dL Final         Passed - Completed PHQ-2 or PHQ-9 in the last 360 days      Passed - Valid encounter within last 12 months    Recent Outpatient Visits           10 months ago Acute pain of right shoulder   [place], Cammie Mcgee, MD   1 year ago Neuropathy   [place] Susy Frizzle, MD   2 years ago Chest pain, unspecified type   [place] Susy Frizzle, MD   2 years ago Rib pain on left side   [place], Warren T, MD   3 years ago Low back pain at multiple sites   [place], Cammie Mcgee, MD

## 2023-01-30 ENCOUNTER — Encounter: Payer: Medicare Other | Admitting: Physical Medicine and Rehabilitation

## 2023-01-30 ENCOUNTER — Telehealth: Payer: Self-pay | Admitting: Physical Medicine and Rehabilitation

## 2023-01-30 NOTE — Telephone Encounter (Signed)
Patient called. She would like to RSC her appointment with Dr. Newton 

## 2023-01-30 NOTE — Telephone Encounter (Signed)
Spoke with patient and rescheduled for 02/06/23

## 2023-02-06 ENCOUNTER — Ambulatory Visit: Payer: Self-pay

## 2023-02-06 ENCOUNTER — Ambulatory Visit (INDEPENDENT_AMBULATORY_CARE_PROVIDER_SITE_OTHER): Payer: Medicare Other | Admitting: Physical Medicine and Rehabilitation

## 2023-02-06 VITALS — BP 127/82 | HR 98

## 2023-02-06 DIAGNOSIS — M5412 Radiculopathy, cervical region: Secondary | ICD-10-CM

## 2023-02-06 MED ORDER — METHYLPREDNISOLONE ACETATE 80 MG/ML IJ SUSP
80.0000 mg | Freq: Once | INTRAMUSCULAR | Status: AC
  Administered 2023-02-06: 80 mg

## 2023-02-06 NOTE — Progress Notes (Signed)
Functional Pain Scale - descriptive words and definitions  Distressing (6)    Pain is present/unable to complete most ADLs limited by pain/sleep is difficult and active distraction is only marginal. Moderate range order  Average Pain  varies on movements  and flare ups   +Driver, -BT, -Dye Allergies.  Neck pain on both sides that radiates to shoulders and arms

## 2023-02-06 NOTE — Patient Instructions (Signed)

## 2023-02-10 ENCOUNTER — Encounter: Payer: Self-pay | Admitting: Emergency Medicine

## 2023-02-10 NOTE — Telephone Encounter (Signed)
Received the following message from patient:   "I had bad one last night. I've  been having trouble with deep cough, chest tightness and a feeling of heaviness like my body weighed a ton. My head feels woozy like you could pass out sometimes. Things are a little better today. My oxygen is 91. Also had covid test and it was negative. I do think I have a lung infection. Not coughing up mucous. A very dry cough. Thought I'd let Dr. Lamonte Sakai know   I asked her if she had had any fevers or shortness of breath, this was her response:   "I'm shaky, not sure about fever. Yes, I'm wheezy."  I did attempt to call her but I had to leave a VM.   Judson Roch, can you please advise since RB isn't here today? Thanks!

## 2023-02-10 NOTE — Telephone Encounter (Signed)
Called patient twice and was able to reach her on the 2nd call. She verbalized understanding of Sarah's recommendations.   Nothing further needed at time of call.

## 2023-02-20 NOTE — Progress Notes (Signed)
Taylor Burnett - 72 y.o. female MRN [id number]  Date of birth: 07-03-1951  Office Visit Note: Visit Date: 02/06/2023 PCP: Susy Frizzle, MD Referred by: Susy Frizzle, MD  Subjective: Chief Complaint  Patient presents with   Neck - Pain   HPI:  Taylor Burnett is a 72 y.o. female who comes in today at the request of Dr. Anderson Malta for planned Right C7-T1 Cervical Interlaminar epidural steroid injection with fluoroscopic guidance.  The patient has failed conservative care including home exercise, medications, time and activity modification.  This injection will be diagnostic and hopefully therapeutic.  Please see requesting physician notes for further details and justification.   ROS Otherwise per HPI.  Assessment & Plan: Visit Diagnoses:    ICD-10-CM   1. Cervical radiculopathy  M54.12 XR C-ARM NO REPORT    Epidural Steroid injection    methylPREDNISolone acetate (DEPO-MEDROL) injection 80 mg      Plan: No additional findings.   Meds & Orders:  Meds ordered this encounter  Medications   methylPREDNISolone acetate (DEPO-MEDROL) injection 80 mg    Orders Placed This Encounter  Procedures   XR C-ARM NO REPORT   Epidural Steroid injection    Follow-up: Return for visit to requesting provider as needed.   Procedures: No procedures performed  Cervical Epidural Steroid Injection - Interlaminar Approach with Fluoroscopic Guidance  Patient: Taylor Burnett      Date of Birth: 15-Apr-1951 MRN: [id number] PCP: Susy Frizzle, MD      Visit Date: 02/06/2023   Universal Protocol:    Date/Time: 02/26/246:00 AM  Consent Given By: the patient  Position: PRONE  Additional Comments: Vital signs were monitored before and after the procedure. Patient was prepped and draped in the usual sterile fashion. The correct patient, procedure, and site was verified.   Injection Procedure Details:   Procedure diagnoses: Cervical radiculopathy [M54.12]    Meds  Administered:  Meds ordered this encounter  Medications   methylPREDNISolone acetate (DEPO-MEDROL) injection 80 mg     Laterality: Right  Location/Site: C7-T1  Needle: 3.5 in., 20 ga. Tuohy  Needle Placement: Paramedian epidural space  Findings:  -Comments: Excellent flow of contrast into the epidural space.  Procedure Details: Using a paramedian approach from the side mentioned above, the region overlying the inferior lamina was localized under fluoroscopic visualization and the soft tissues overlying this structure were infiltrated with 4 ml. of 1% Lidocaine without Epinephrine. A # 20 gauge, Tuohy needle was inserted into the epidural space using a paramedian approach.  The epidural space was localized using loss of resistance along with contralateral oblique bi-planar fluoroscopic views.  After negative aspirate for air, blood, and CSF, a 2 ml. volume of Isovue-250 was injected into the epidural space and the flow of contrast was observed. Radiographs were obtained for documentation purposes.   The injectate was administered into the level noted above.  Additional Comments:  No complications occurred Dressing: 2 x 2 sterile gauze and Band-Aid    Post-procedure details: Patient was observed during the procedure. Post-procedure instructions were reviewed.  Patient left the clinic in stable condition.    Clinical History: MRI CERVICAL SPINE WITHOUT CONTRAST   TECHNIQUE: Multiplanar, multisequence MR imaging of the cervical spine was performed. No intravenous contrast was administered.   COMPARISON:  Cervical spine MRI 10/15/2019   FINDINGS: Alignment: Chronic straightening of the normal cervical lordosis. Unchanged grade 1 anterolisthesis of C2 on C3, C7 on T1, T1 on T2, and  T2 on T3.   Vertebrae: No fracture, suspicious marrow lesion, or significant marrow edema. Prior C3-C6 ACDF. Facet ankylosis at C3-4 and C5-6.   Cord: Normal signal.   Posterior Fossa,  vertebral arteries, paraspinal tissues: Unremarkable.   Disc levels:   C2-3: Anterolisthesis with mild bulging of uncovered disc, infolding of the ligamentum flavum, uncovertebral spurring, and severe facet arthrosis result in moderate spinal stenosis and moderate left greater than right neural foraminal stenosis, mildly progressed.   C3-4 through C5-6: ACDF.  No significant residual stenosis.   C6-7: Severe disc space narrowing. Broad-based posterior disc osteophyte complex and moderate facet arthrosis result in mild spinal stenosis and mild right and moderate left neural foraminal stenosis, unchanged.   C7-T1: Anterolisthesis with slight bulging of uncovered disc and mild right and severe left facet arthrosis result in mild left neural foraminal stenosis without spinal stenosis, unchanged.   T1-2: Anterolisthesis with disc uncovering and mild-to-moderate facet arthrosis without stenosis.   IMPRESSION: 1. Severe C2-3 facet arthrosis with moderate spinal stenosis and moderate left greater than right neural foraminal stenosis, mildly progressed. 2. Unchanged mild spinal stenosis and moderate left foraminal stenosis at C6-7. 3. C3-C6 ACDF without significant residual stenosis.     Electronically Signed   By: Logan Bores M.D.   On: 04/26/2022 13:15     Objective:  VS:  HT:    WT:   BMI:     BP:127/82  HR:98bpm  TEMP: ( )  RESP:  Physical Exam Vitals and nursing note reviewed.  Constitutional:      General: She is not in acute distress.    Appearance: Normal appearance. She is not ill-appearing.  HENT:     Head: Normocephalic and atraumatic.     Right Ear: External ear normal.     Left Ear: External ear normal.  Eyes:     Extraocular Movements: Extraocular movements intact.  Cardiovascular:     Rate and Rhythm: Normal rate.     Pulses: Normal pulses.  Musculoskeletal:     Cervical back: Tenderness present. No rigidity.     Right lower leg: No edema.      Left lower leg: No edema.     Comments: Patient has good strength in the upper extremities including 5 out of 5 strength in wrist extension long finger flexion and APB.  There is no atrophy of the hands intrinsically.  There is a negative Hoffmann's test.   Lymphadenopathy:     Cervical: No cervical adenopathy.  Skin:    Findings: No erythema, lesion or rash.  Neurological:     General: No focal deficit present.     Mental Status: She is alert and oriented to person, place, and time.     Sensory: No sensory deficit.     Motor: No weakness or abnormal muscle tone.     Coordination: Coordination normal.  Psychiatric:        Mood and Affect: Mood normal.        Behavior: Behavior normal.      Imaging: No results found.

## 2023-02-20 NOTE — Procedures (Signed)
Cervical Epidural Steroid Injection - Interlaminar Approach with Fluoroscopic Guidance  Patient: Taylor Burnett      Date of Birth: 07-08-1951 MRN: [id number] PCP: Susy Frizzle, MD      Visit Date: 02/06/2023   Universal Protocol:    Date/Time: 02/26/246:00 AM  Consent Given By: the patient  Position: PRONE  Additional Comments: Vital signs were monitored before and after the procedure. Patient was prepped and draped in the usual sterile fashion. The correct patient, procedure, and site was verified.   Injection Procedure Details:   Procedure diagnoses: Cervical radiculopathy [M54.12]    Meds Administered:  Meds ordered this encounter  Medications   methylPREDNISolone acetate (DEPO-MEDROL) injection 80 mg     Laterality: Right  Location/Site: C7-T1  Needle: 3.5 in., 20 ga. Tuohy  Needle Placement: Paramedian epidural space  Findings:  -Comments: Excellent flow of contrast into the epidural space.  Procedure Details: Using a paramedian approach from the side mentioned above, the region overlying the inferior lamina was localized under fluoroscopic visualization and the soft tissues overlying this structure were infiltrated with 4 ml. of 1% Lidocaine without Epinephrine. A # 20 gauge, Tuohy needle was inserted into the epidural space using a paramedian approach.  The epidural space was localized using loss of resistance along with contralateral oblique bi-planar fluoroscopic views.  After negative aspirate for air, blood, and CSF, a 2 ml. volume of Isovue-250 was injected into the epidural space and the flow of contrast was observed. Radiographs were obtained for documentation purposes.   The injectate was administered into the level noted above.  Additional Comments:  No complications occurred Dressing: 2 x 2 sterile gauze and Band-Aid    Post-procedure details: Patient was observed during the procedure. Post-procedure instructions were reviewed.  Patient  left the clinic in stable condition.

## 2023-02-25 ENCOUNTER — Other Ambulatory Visit: Payer: Self-pay | Admitting: Family Medicine

## 2023-03-07 ENCOUNTER — Other Ambulatory Visit (HOSPITAL_COMMUNITY): Payer: Medicare Other

## 2023-03-08 ENCOUNTER — Ambulatory Visit
Admission: RE | Admit: 2023-03-08 | Discharge: 2023-03-08 | Disposition: A | Discharge status: Home / Self Care | Payer: Medicare Other | Source: Ambulatory Visit | Attending: Emergency Medicine | Admitting: Emergency Medicine

## 2023-03-08 DIAGNOSIS — J449 Chronic obstructive pulmonary disease, unspecified: Secondary | ICD-10-CM | POA: Diagnosis not present

## 2023-03-08 DIAGNOSIS — R911 Solitary pulmonary nodule: Secondary | ICD-10-CM | POA: Diagnosis not present

## 2023-03-08 DIAGNOSIS — R918 Other nonspecific abnormal finding of lung field: Secondary | ICD-10-CM

## 2023-03-08 DIAGNOSIS — I700 Atherosclerosis of aorta: Secondary | ICD-10-CM | POA: Diagnosis not present

## 2023-03-08 DIAGNOSIS — J439 Emphysema, unspecified: Secondary | ICD-10-CM | POA: Diagnosis not present

## 2023-03-14 ENCOUNTER — Encounter: Payer: Self-pay | Admitting: Emergency Medicine

## 2023-03-14 ENCOUNTER — Ambulatory Visit (INDEPENDENT_AMBULATORY_CARE_PROVIDER_SITE_OTHER): Payer: Medicare Other | Admitting: Emergency Medicine

## 2023-03-14 VITALS — BP 116/82 | HR 92 | Temp 98.0°F | Ht 62.0 in | Wt 138.6 lb

## 2023-03-14 DIAGNOSIS — J438 Other emphysema: Secondary | ICD-10-CM

## 2023-03-14 DIAGNOSIS — R918 Other nonspecific abnormal finding of lung field: Secondary | ICD-10-CM

## 2023-03-14 NOTE — Assessment & Plan Note (Signed)
The chest shows that her lateral right middle lobe nodule remains about the same size but the solid component has increased in size.  When compared with remote scans going back to 2015 the nodule has clearly grown.  Suggestive of a slow-growing adenocarcinoma.  At this point we have decided to be conservative, avoid invasive testing.  We will perform a PET scan at the 73-month mark which will give [place] information about size stability and also hypermetabolism.  At that time we can discuss whether she may be a candidate for empiric SBRT without a tissue diagnosis given her risk for bronchoscopy.  We reviewed your CT scan of the chest today We will plan to perform a PET scan in September 2024 to follow pulmonary nodule. Follow Dr. Lamonte Sakai in September after your PET scan so we can review the results together.  Call sooner if you have any problems

## 2023-03-14 NOTE — Patient Instructions (Addendum)
We reviewed your CT scan of the chest today We will plan to perform a PET scan in September 2024 to follow pulmonary nodule. Continue your Breztri 2 puffs twice a day Continue Brovana as you have been taking it Continue your albuterol and ipratropium nebulizers as you have been using them. Continue your oxygen at all times. Follow Dr. Lamonte Sakai in September after your PET scan so we can review the results together.  Call sooner if you have any problems

## 2023-03-14 NOTE — Assessment & Plan Note (Signed)
Continue your Breztri 2 puffs twice a day Continue Brovana as you have been taking it Continue your albuterol and ipratropium nebulizers as you have been using them.

## 2023-03-14 NOTE — Progress Notes (Signed)
Subjective:    Patient ID: Taylor Burnett, female    DOB: 08/04/1951, 72 y.o.   MRN: [id number]  Shortness of Breath   ROV 06/22/22 --72 year old woman with very severe COPD.  She is colonized with Mycobacterium gordonii and has associated pulmonary nodular disease, chronic cough.  She has chronic hypoxemic respiratory failure, GERD, rhinitis.  I treated her for an acute flare 05/16/2022 with prednisone and doxycycline. She didn't take the pred because it gives her suicidal ideation.  She is currently managed on Breztri as well with Brovana bid.  Using albuterol approximately 3-4x a day  Flonase.  No longer on pantoprazole She has a cough right now, has paroxysms and then becomes dyspneic.   ROV 09/07/22 --72 year old woman with very severe COPD, Mycobacterium gordonii colonization with associated pulmonary nodular disease, rhinitis and GERD with chronic cough.  She has chronic hypoxemic respiratory failure she has been managed on Breztri as well as Brovana twice daily.  Ipratropium nebs as well. She uses albuterol approximately 4x a day. Her O2 is 2L/min.   CT chest performed on 08/10/2022 reviewed by me showed moderate centrilobular emphysema, stable lateral right middle lobe nodule 1 cm, stable (definitively benign) 5 mm left lower lobe nodule.   ROV 03/14/23 --72 year old woman with very severe COPD, pulmonary nodular disease with Mycobacterium gordonae colonization she is on oxygen 2 L/min.  Managed on Breztri and also Brovana twice daily, ipratropium nebs.  She uses albuterol several times a day.  She was having increased dyspnea, symptoms in mid February. More cough, deeper although non-productive, some associated dizziness.    CT chest 03/08/2023 reviewed by me, shows ground glass/mixed density right middle lobe pulmonary nodule, stable in size but with a slightly enlarging solid component.  Compared with 2015 the nodule has grown    Review of Systems  Respiratory:  Positive for  shortness of breath.    As per HPI     Objective:   Physical Exam Vitals:   03/14/23 1441  BP: 116/82  Pulse: 92  Temp: 98 F (36.7 C)  TempSrc: Oral  SpO2: 95%  Weight: 138 lb 9.6 oz (62.9 kg)  Height: 5\' 2"  (1.575 m)    Gen: Pleasant, well-nourished, in no distress,  normal affect  ENT: No lesions,  mouth clear,  oropharynx clear, no postnasal drip  Neck: No JVD, no stridor  Lungs: No use of accessory muscles, very distant, clear, no wheeze on normal breath.   Cardiovascular: RRR, heart sounds normal, no murmur or gallops, no peripheral edema  Musculoskeletal: No deformities, no cyanosis or clubbing  Neuro: alert, non focal  Skin: Warm, no lesions or rashes      Assessment & Plan:  COPD (chronic obstructive pulmonary disease) with emphysema (HCC) Continue your Breztri 2 puffs twice a day Continue Brovana as you have been taking it Continue your albuterol and ipratropium nebulizers as you have been using them.  Pulmonary nodules The chest shows that her lateral right middle lobe nodule remains about the same size but the solid component has increased in size.  When compared with remote scans going back to 2015 the nodule has clearly grown.  Suggestive of a slow-growing adenocarcinoma.  At this point we have decided to be conservative, avoid invasive testing.  We will perform a PET scan at the 49-month mark which will give [place] information about size stability and also hypermetabolism.  At that time we can discuss whether she may be a candidate for empiric SBRT without a  tissue diagnosis given her risk for bronchoscopy.  We reviewed your CT scan of the chest today We will plan to perform a PET scan in September 2024 to follow pulmonary nodule. Follow Dr. Lamonte Sakai in September after your PET scan so we can review the results together.  Call sooner if you have any problems  Baltazar Apo, MD, PhD 03/14/2023, 4:59 PM [facility] [phone] or if no  answer [phone]

## 2023-04-10 ENCOUNTER — Other Ambulatory Visit: Payer: Self-pay | Admitting: Family Medicine

## 2023-04-11 MED ORDER — CALCIUM CARBONATE 600 MG PO TABS: 1200.0000 mg | ORAL_TABLET | Freq: Every day | ORAL | 0 refills | Status: DC

## 2023-04-16 IMAGING — XA DG FLUORO GUIDE NDL PLC/BX
1 series · 1 of 1 positions shown · non-contrast
Comparison: none

CLINICAL DATA: Chronic right shoulder pain.

[Series 1: ortho standard · 1 of 1 slices shown]
[im 1/1]
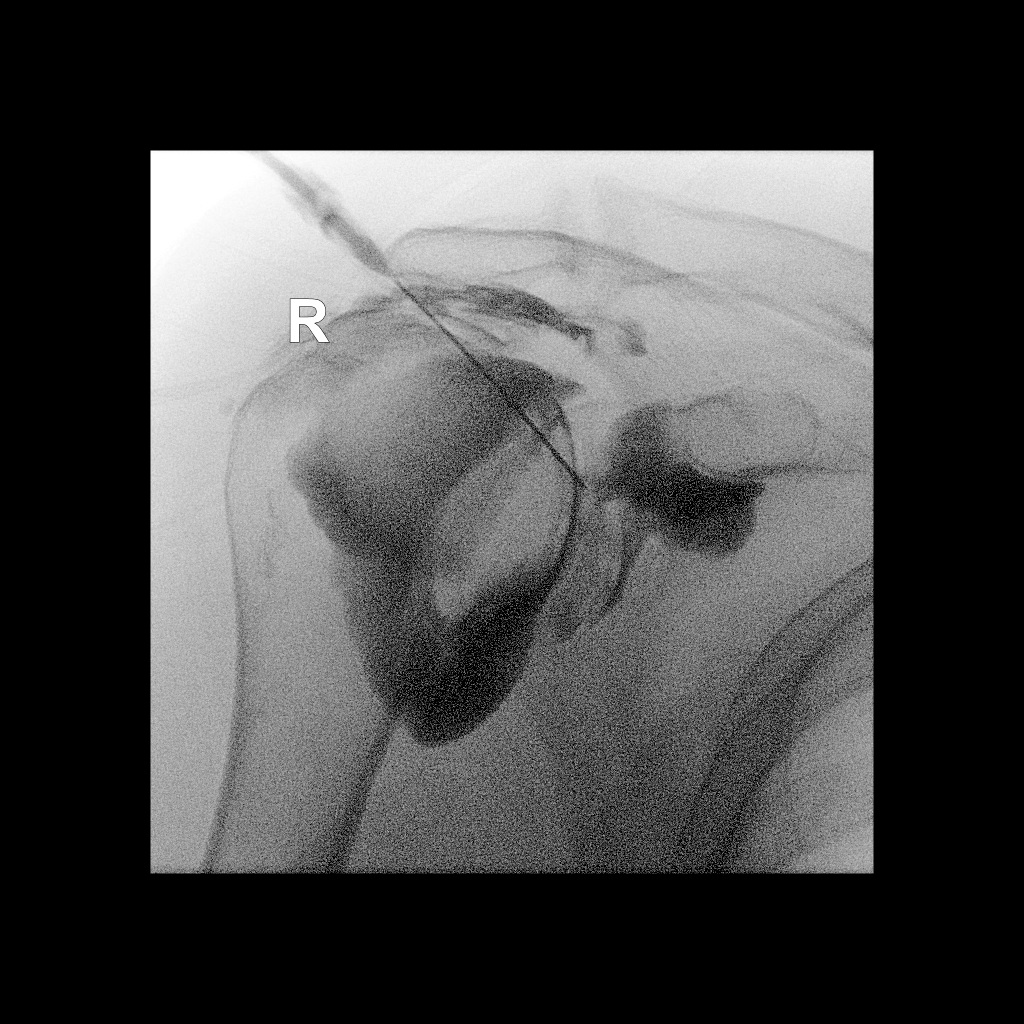

[1 of 1 positions shown; findings below may reference images not displayed]

FLUOROSCOPY TIME:  Radiation Exposure Index (as provided by the
fluoroscopic device): 0.3 mGy Kerma

PROCEDURE:
The risks and benefits of the procedure were discussed with the
patient, and written informed consent was obtained. The patient
stated no history of allergy to contrast media. A formal timeout
procedure was performed with the patient according to departmental
protocol.

The patient was placed supine on the fluoroscopy table and the right
glenohumeral joint was identified under fluoroscopy. The skin
overlying the right glenohumeral joint was subsequently cleaned with
Betadine and a sterile drape was placed over the area of interest. 2
ml 1% Lidocaine was used to anesthetize the skin around the needle
insertion site.

A 22 gauge spinal needle was inserted into the right glenohumeral
joint under fluoroscopy.

12 ml of gadolinium mixture (0.1 ml of Multihance mixed with 15 ml
of Isovue-M 200 contrast and 5 ml of sterile saline) were injected
into the right glenohumeral joint. Contrast extends into the
subacromial/subdeltoid bursa, consistent with full-thickness rotator
cuff tear.

The needle was removed and hemostasis was achieved. The patient was
subsequently transferred to MRI for imaging.
IMPRESSION: Technically successful right shoulder injection for MRI.

## 2023-04-16 IMAGING — MR MR SHOULDER*R* W/CM
4 of 6 series · 13 of 40 positions shown · IV contrast (agent unspecified)
Comparison: Right shoulder radiographs 03/18/2022

CLINICAL DATA: Six months of right shoulder pain. Evaluate for
rotator cuff tear.

EXAM:
MR ARTHROGRAM OF THE RIGHT SHOULDER
TECHNIQUE: Multiplanar, multisequence MR imaging of the right shoulder was
performed following the administration of intra-articular contrast.
CONTRAST:  See Injection Documentation.

[Series 7: T1 fat-sat · axial · right · 3.0mm · 0.44mm/px · z∈[+0,+71]mm · 3 of 26 slices shown (1 of 2)]
[im 6/26]
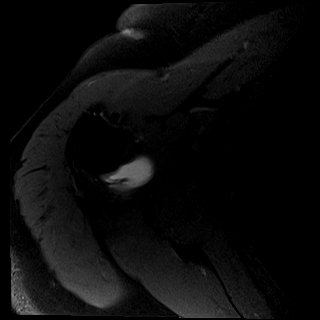
[im 16/26]
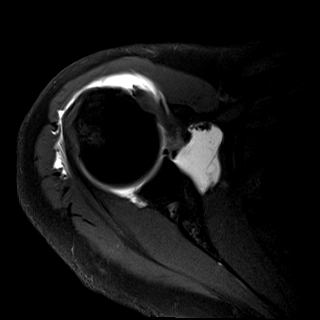
[im 26/26]
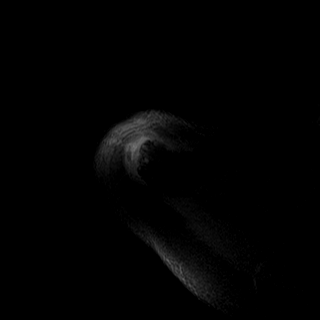

[Series 8: T2 fat-sat · coronal · right · 3.0mm · 0.22mm/px · 4 of 28 slices shown (1 of 2)]
[im 1/28]
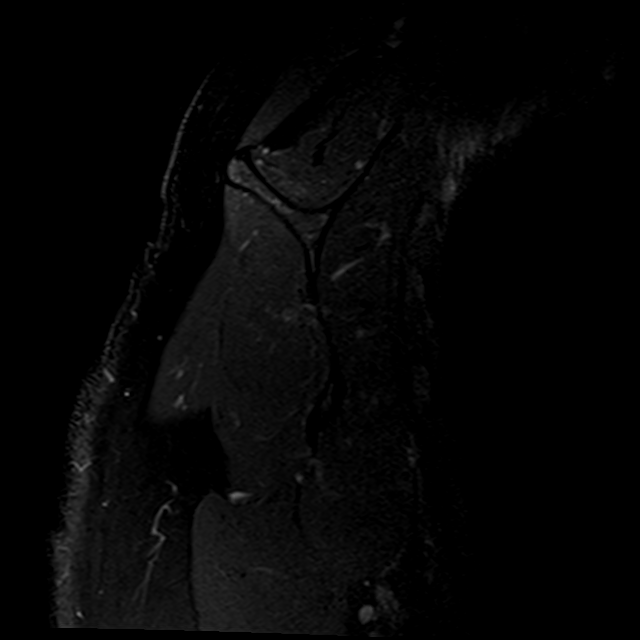
[im 5/28]
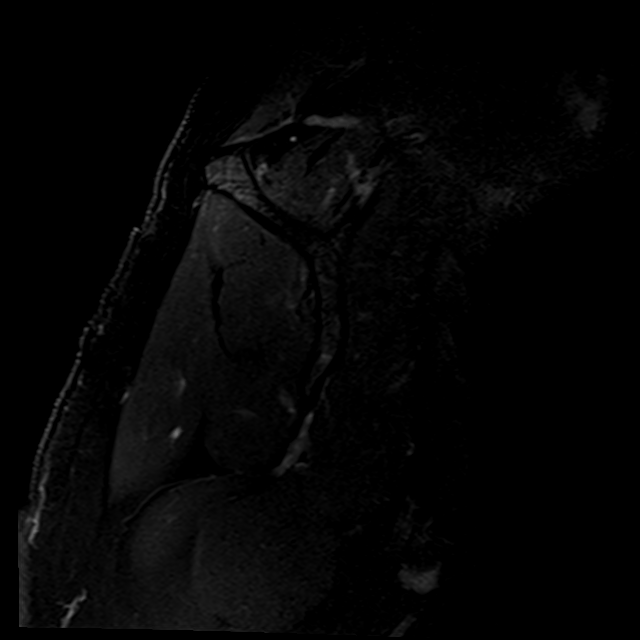
[im 14/28]
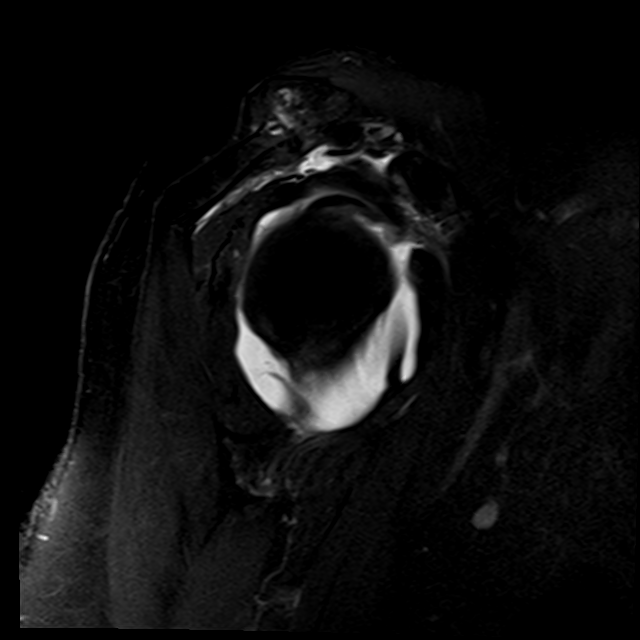
[im 23/28]
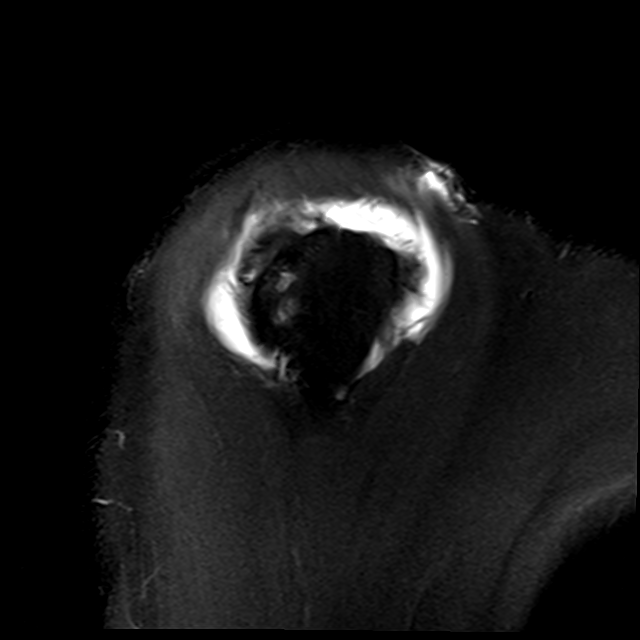

[Series 9: T1 fat-sat · oblique · right · 3.0mm · 0.18mm/px · 3 of 25 slices shown (2 of 2)]
[im 5/25]
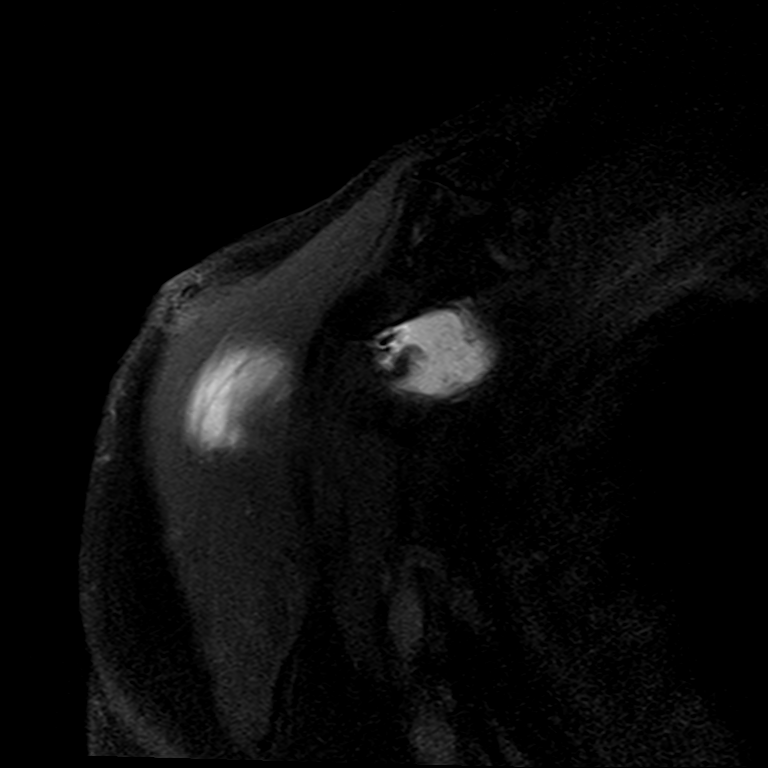
[im 13/25]
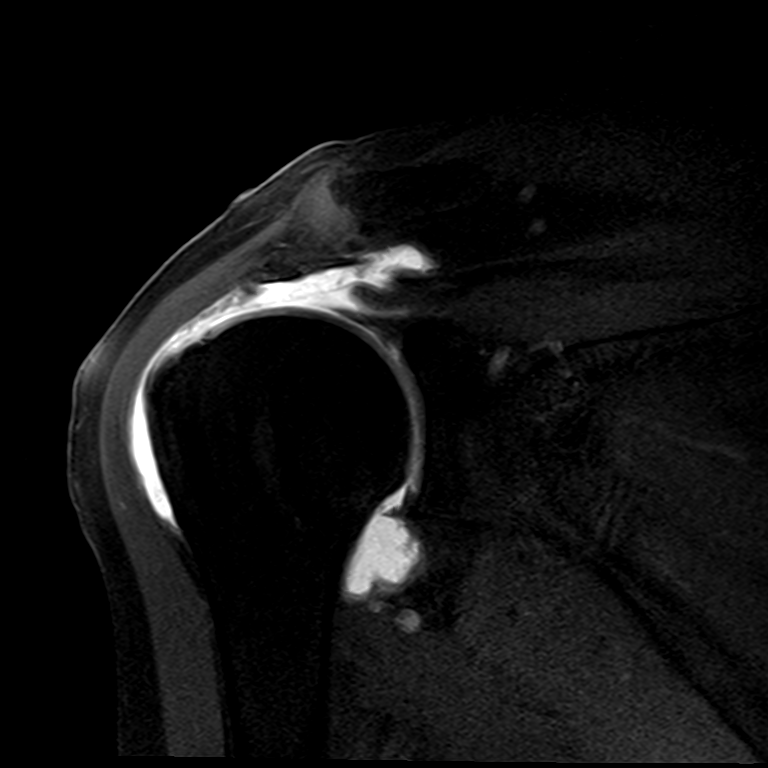
[im 21/25]
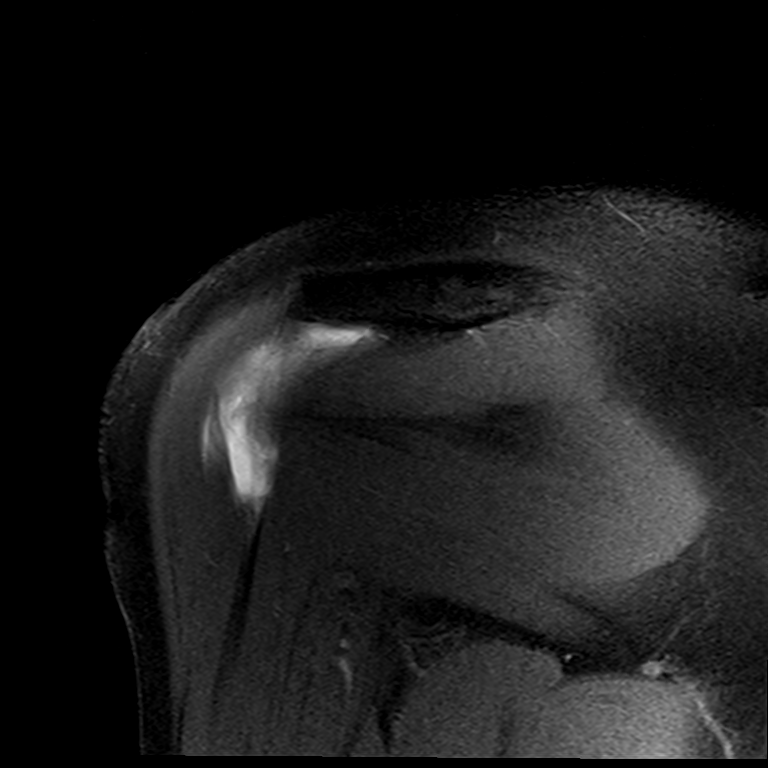

[Series 11: T2 fat-sat · oblique · right · 3.0mm · 0.22mm/px · 3 of 25 slices shown (2 of 2)]
[im 5/25]
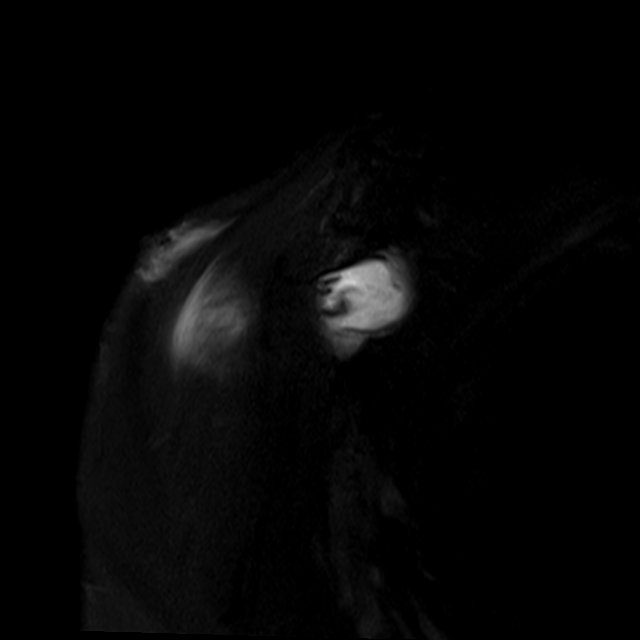
[im 13/25]
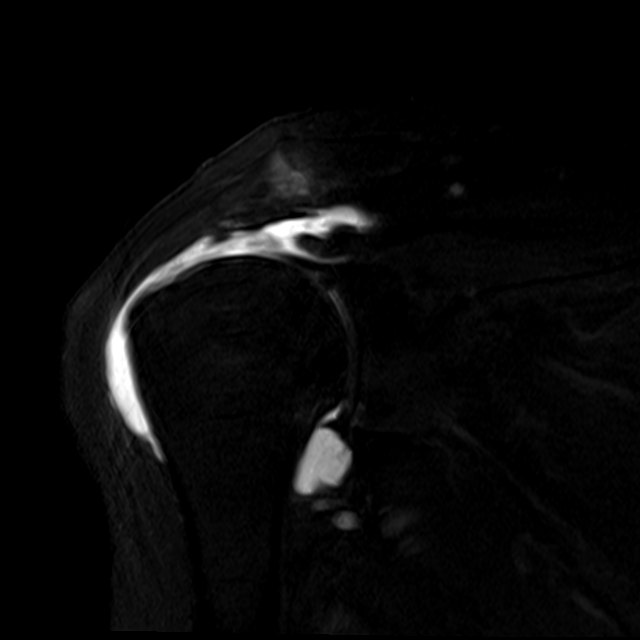
[im 21/25]
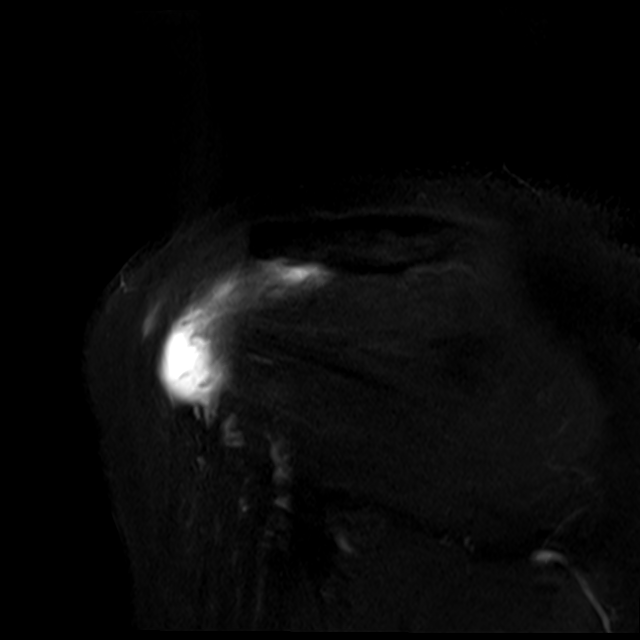

[13 of 40 positions shown; findings below may reference images not displayed]

FINDINGS: Rotator cuff: There is a large full-thickness tear of the entire
supraspinatus tendon. Posteriorly, this involves the critical zone
of the tendon (coronal series 11 images 11 and 12). Within the more
anterior aspect of the supraspinatus tendon this tear involves the
distal tendon footprint. There is at least 3.1 cm tendon retraction
of the anterior and mid AP dimension of the supraspinatus tendon.
Mild posterior infraspinatus insertional intermediate T2 signal and
thickening tendinosis with possible tiny partial-thickness
midsubstance tear (sagittal series 8 images 5 through 7). The
subscapularis and teres minor are intact.

Muscles:  Mild to moderate anterior supraspinatus muscle atrophy.

Biceps long head: Mild intermediate T2 signal and attenuation of the
proximal long head of the biceps tendon proximal to the bicipital
groove likely a chronic partial-thickness tear.

Acromioclavicular Joint: There are moderate to severe degenerative
changes of the acromioclavicular joint including joint space
narrowing, subchondral marrow edema, and peripheral osteophytosis.
Type II acromion. Moderate distal lateral subacromial broad-based
spurring.

Glenohumeral Joint: Mild-to-moderate glenoid and humeral head
cartilage thinning. Mild inferior humeral head-neck junction
degenerative osteophytosis.

Labrum: Moderate to high-grade attenuation of the posterosuperior
glenoid labrum, with focal absence of labral tissue visualized on
axial images 11 and 12 at the [DATE] location.

Bones: Moderate subcortical cystic change within the humeral head
deep to the posterior infraspinatus tendon insertion (sagittal
series 8, image 7 and coronal series 10, image 9).

Within the more posterior, inferior and medial aspect of the humeral
head there is a heterogeneous predominantly intermediate to
decreased T1 with speckled areas of interposed fat signal and both
speckled decreased and increased T2 with fat saturation signal
suggesting a chondroid matrix lesion, measuring up to 12 mm and
bordering the posterior humeral head cortex. No surrounding marrow
edema or cortical destruction. This is favored to represent a benign
chondroid matrix lesion such as an enchondroma.

Other: None.
IMPRESSION: :
IMPRESSION: 1. Large full-thickness tear of the entire supraspinatus tendon.
Mild-to-moderate anterior supraspinatus muscle atrophy suggests at
least a component of this tear is chronic.
2. Mild posterior infraspinatus focal tendinosis and possible tiny
partial-thickness midsubstance tear without tendon retraction.
3. Proximal long head of the biceps chronic partial-thickness tear.
4. Moderate to severe degenerative changes of the acromioclavicular
joint.
5. Mild-to-moderate glenohumeral cartilage degenerative changes.
6. Degenerative change with high-grade attenuation of the
posterosuperior glenoid labrum.
7. Posterior humeral head chondroid matrix lesion favored to
represent a benign enchondroma measuring up to 12 mm.

## 2023-04-23 ENCOUNTER — Encounter: Payer: Self-pay | Admitting: Family Medicine

## 2023-04-24 ENCOUNTER — Other Ambulatory Visit: Payer: Self-pay | Admitting: Family Medicine

## 2023-04-24 ENCOUNTER — Telehealth: Payer: Self-pay

## 2023-04-24 DIAGNOSIS — Z961 Presence of intraocular lens: Secondary | ICD-10-CM | POA: Diagnosis not present

## 2023-04-24 DIAGNOSIS — H04123 Dry eye syndrome of bilateral lacrimal glands: Secondary | ICD-10-CM | POA: Diagnosis not present

## 2023-04-24 MED ORDER — GABAPENTIN 100 MG PO CAPS: 100.0000 mg | ORAL_CAPSULE | Freq: Three times a day (TID) | ORAL | 0 refills | Status: DC

## 2023-04-24 NOTE — Telephone Encounter (Signed)
Pt sen My Chart message requesting a refill on her Gabapentin. Last RF was 02/27/2023, last OV was 03/11/2022. Pt uses [facility].  My Chart message sent back to patient to have her call and schedule a physical. Thanks.

## 2023-04-28 ENCOUNTER — Other Ambulatory Visit: Payer: Self-pay | Admitting: Adult Health

## 2023-04-28 DIAGNOSIS — J438 Other emphysema: Secondary | ICD-10-CM

## 2023-04-28 DIAGNOSIS — R053 Chronic cough: Secondary | ICD-10-CM

## 2023-05-24 ENCOUNTER — Encounter: Payer: Self-pay | Admitting: Family Medicine

## 2023-05-24 ENCOUNTER — Ambulatory Visit (INDEPENDENT_AMBULATORY_CARE_PROVIDER_SITE_OTHER): Payer: Medicare Other | Admitting: Family Medicine

## 2023-05-24 VITALS — BP 122/64 | HR 91 | Temp 97.6°F | Ht 62.0 in | Wt 139.8 lb

## 2023-05-24 DIAGNOSIS — E7800 Pure hypercholesterolemia, unspecified: Secondary | ICD-10-CM

## 2023-05-24 DIAGNOSIS — G629 Polyneuropathy, unspecified: Secondary | ICD-10-CM | POA: Diagnosis not present

## 2023-05-24 DIAGNOSIS — J9611 Chronic respiratory failure with hypoxia: Secondary | ICD-10-CM

## 2023-05-24 DIAGNOSIS — I2510 Atherosclerotic heart disease of native coronary artery without angina pectoris: Secondary | ICD-10-CM | POA: Diagnosis not present

## 2023-05-24 DIAGNOSIS — J438 Other emphysema: Secondary | ICD-10-CM

## 2023-05-24 MED ORDER — GABAPENTIN 300 MG PO CAPS: 300.0000 mg | ORAL_CAPSULE | Freq: Three times a day (TID) | ORAL | 3 refills | Status: AC

## 2023-05-24 NOTE — Progress Notes (Signed)
Subjective:    Patient ID: Taylor Burnett, female    DOB: September 03, 1951, 72 y.o.   MRN: [id number]  Patient has bilateral neuropathy in both feet.  She is taking gabapentin 100 mg 3 times a day and this helps.  She denies any dizziness or sleepiness on the gabapentin.  She would be interesting in increasing the dose to try to improve her pain.  She has a allergy to Fosamax in the lung.  She is having a PET scan performed in September to determine if she should pursue treatment with radiation therapy.  This obviously has her worried.  She is due today to monitor lab work for her hyperlipidemia for which she takes atorvastatin.  Her blood pressure today is well-controlled.  She is taking a combination of Brovana, ipratropium, andBreztri.  Her pulmonologist is aware of this.  This combination is the only thing that seems to help her breathing. Past Medical History:  Diagnosis Date   Arthritis    CAD (coronary artery disease), native coronary artery    mild non obstructive CAD of the RCA, LCX, LAD up to 25-49% by coronary CTA 11/2020   Cataract    COPD (chronic obstructive pulmonary disease) (HCC)    Cough    DDD (degenerative disc disease), cervical    Dysrhythmia    RAPID HEART BEAT OCC    Emphysema of lung (HCC)    GERD (gastroesophageal reflux disease)    OCC   H/O degenerative disc disease    Headache    Myocardial infarction (HCC) 2016   Osteoporosis    Panic attacks    PAT (paroxysmal atrial tachycardia)    noted on event monitor 11/2020   Pre-diabetes    Severe lung disease    Shortness of breath dyspnea    Sleep apnea    Spondylosis    Past Surgical History:  Procedure Laterality Date   ANTERIOR CERVICAL DECOMP/DISCECTOMY FUSION N/A 03/13/2015   Procedure: CERVICAL THREE-CERVICAL FOUR, CERVICAL FOUR-CERVICAL FIVE, CERVICAL FIVE-CERVICAL SIX ANTERIOR CERVICAL DECOMPRESSION/DISCECTOMY FUSION 3 LEVELS;  Surgeon: Tia Alert, MD;  Location: [facility];  Service: Neurosurgery;   Laterality: N/A;   BIOPSY  03/25/2021   Procedure: BIOPSY;  Surgeon: Rachael Fee, MD;  Location: [facility];  Service: Endoscopy;;   BREAST ENHANCEMENT SURGERY  1990   CARDIAC CATHETERIZATION  1995   CATARACT EXTRACTION W/PHACO Right 05/02/2017   Procedure: CATARACT EXTRACTION PHACO AND INTRAOCULAR LENS PLACEMENT (IOC);  Surgeon: Jethro Bolus, MD;  Location: [facility];  Service: Ophthalmology;  Laterality: Right;  CDE: 6.22   CATARACT EXTRACTION W/PHACO Left 05/16/2017   Procedure: CATARACT EXTRACTION PHACO AND INTRAOCULAR LENS PLACEMENT (IOC);  Surgeon: Jethro Bolus, MD;  Location: [facility];  Service: Ophthalmology;  Laterality: Left;  CDE: 4.10   ESOPHAGOGASTRODUODENOSCOPY (EGD) WITH PROPOFOL N/A 03/25/2021   Procedure: ESOPHAGOGASTRODUODENOSCOPY (EGD) WITH PROPOFOL;  Surgeon: Rachael Fee, MD;  Location: [facility];  Service: Endoscopy;  Laterality: N/A;   TONSILLECTOMY     TUBAL LIGATION     VIDEO BRONCHOSCOPY Bilateral 12/31/2018   Procedure: VIDEO BRONCHOSCOPY WITHOUT FLUORO;  Surgeon: Leslye Peer, MD;  Location: [facility];  Service: Cardiopulmonary;  Laterality: Bilateral;   Current Outpatient Medications on File Prior to Visit  Medication Sig Dispense Refill   albuterol (VENTOLIN HFA) 108 (90 Base) MCG/ACT inhaler INHALE 2 PUFFS INTO THE LUNGS EVERY 6 HOURS AS NEEDED. 8.5 g 0   arformoterol (BROVANA) 15 MCG/2ML NEBU Use 1 Vial In Nebulizer Twice Daily -  Morning And Evening 60 mL 11   aspirin EC 81 MG tablet Take 1 tablet (81 mg total) by mouth daily. Swallow whole. 90 tablet 3   atorvastatin (LIPITOR) 80 MG tablet Take 1 tablet (80 mg total) by mouth daily. 90 tablet 3   Budeson-Glycopyrrol-Formoterol (BREZTRI AEROSPHERE) 160-9-4.8 MCG/ACT AERO INHALE (2) PUFFS INTO THE LUNGS IN THE MORNING AND AT BEDTIME 10.7 g 5   calcium carbonate (CALCIUM 600) 600 MG TABS tablet Take 2 tablets (1,200 mg total) by mouth daily with breakfast. 30 tablet 0   Cholecalciferol (VITAMIN D) 2000  units CAPS Take 2,000 Units by mouth daily.      diltiazem (CARDIZEM CD) 180 MG 24 hr capsule Take 1 capsule (180 mg total) by mouth daily. 90 capsule 3   fluticasone (FLONASE) 50 MCG/ACT nasal spray Place 2 sprays into both nostrils every evening. 16 mL 12   gabapentin (NEURONTIN) 100 MG capsule Take 1 capsule (100 mg total) by mouth 3 (three) times daily. 90 capsule 0   ipratropium (ATROVENT) 0.02 % nebulizer solution USE 1 VIAL IN NEBULIZER 3 TIMES DAILY 90 mL 11   naproxen sodium (ALEVE) 220 MG tablet Take 440 mg by mouth 2 (two) times daily as needed (pain).      OXYGEN Inhale 2 L into the lungs as needed. FOR USE WITH EXERTION     Current Facility-Administered Medications on File Prior to Visit  Medication Dose Route Frequency Provider Last Rate Last Admin   denosumab (PROLIA) injection 60 mg  60 mg Subcutaneous Q6 months Donita Brooks, MD   60 mg at 02/13/19 1553   Allergies  Allergen Reactions   Prednisone Other (See Comments)    Depression and suicidal   Toprol Xl [Metoprolol] Other (See Comments)    Fatigue   Social History   Socioeconomic History   Marital status: Divorced    Spouse name: Not on file   Number of children: 3   Years of education: Not on file   Highest education level: 12th grade  Occupational History   Occupation: on disability.  Tobacco Use   Smoking status: Former    Packs/day: 2.00    Years: 40.00    Additional pack years: 0.00    Total pack years: 80.00    Types: Cigarettes    Quit date: 07/31/2013    Years since quitting: 9.8   Smokeless tobacco: Never   Tobacco comments:    Would like Lung Ca Screening, quit in 2014. 10/29/2021  Vaping Use   Vaping Use: Never used  Substance and Sexual Activity   Alcohol use: No    Comment: OCC WINE    Drug use: No   Sexual activity: Not on file  Other Topics Concern   Not on file  Social History Narrative   Divorced and lives alone.    2 sons and 1 daughter   6 grandchildren.    Social  Determinants of Health   Financial Resource Strain: Low Risk  (05/18/2023)   Overall Financial Resource Strain (CARDIA)    Difficulty of Paying Living Expenses: Not very hard  Food Insecurity: Food Insecurity Present (05/18/2023)   Hunger Vital Sign    Worried About Running Out of Food in the Last Year: Sometimes true    Ran Out of Food in the Last Year: Patient declined  Transportation Needs: Unknown (05/18/2023)   PRAPARE - Administrator, Civil Service (Medical): No    Lack of Transportation (Non-Medical): Patient declined  Physical Activity: Sufficiently Active (05/18/2023)   Exercise Vital Sign    Days of Exercise per Week: 4 days    Minutes of Exercise per Session: 60 min  Stress: No Stress Concern Present (05/18/2023)   [employer] of Occupational Health - Occupational Stress Questionnaire    Feeling of Stress : Only a little  Social Connections: Unknown (05/18/2023)   Social Connection and Isolation Panel [NHANES]    Frequency of Communication with Friends and Family: More than three times a week    Frequency of Social Gatherings with Friends and Family: More than three times a week    Attends Religious Services: Patient declined    Database administrator or Organizations: Yes    Attends Banker Meetings: Patient declined    Marital Status: Widowed  Intimate Partner Violence: Not At Risk (10/29/2021)   Humiliation, Afraid, Rape, and Kick questionnaire    Fear of Current or Ex-Partner: No    Emotionally Abused: No    Physically Abused: No    Sexually Abused: No      Review of Systems  Psychiatric/Behavioral:  Positive for depression.        Objective:   Physical Exam Vitals reviewed.  Constitutional:      General: She is not in acute distress.    Appearance: She is not diaphoretic.  HENT:     Head: Normocephalic and atraumatic.     Nose: Nose normal.  Eyes:     Conjunctiva/sclera: Conjunctivae normal.     Pupils: Pupils are equal,  round, and reactive to light.  Cardiovascular:     Rate and Rhythm: Normal rate. Rhythm irregular.     Heart sounds: Normal heart sounds. No murmur heard.    No friction rub. No gallop.  Pulmonary:     Effort: Pulmonary effort is normal.     Breath sounds: Examination of the right-lower field reveals rales. Examination of the left-lower field reveals rales. Rales present. No wheezing or rhonchi.  Abdominal:     Palpations: Abdomen is soft.  Musculoskeletal:     Cervical back: Neck supple.     Right foot: Normal range of motion. No swelling, deformity, tenderness or bony tenderness. Normal pulse.     Left foot: Normal range of motion. No swelling, deformity, tenderness or bony tenderness. Normal pulse.    Patient has chronic bibasilar crackles.  This was present on every office visit dating back to 2019 when she establish care.  This is not a new finding.  However today on exam, her heart rate is irregular.       Assessment & Plan:  Pure hypercholesterolemia - Plan: CBC with Differential/Platelet, Lipid panel, COMPLETE METABOLIC PANEL WITH GFR  Polyneuropathy  Chronic respiratory failure with hypoxia (HCC)  Coronary artery disease involving native coronary artery of native heart without angina pectoris  Other emphysema (HCC) We will try increasing gabapentin to 300 mg 3 times a day to see if this better controls her pain.  Await the results of the PET scan.  Pulmonology is managing the mass seen in her lung and will determine treatment.  Defer to pulmonology's recommendations regarding her emphysema.  Given her history of coronary artery disease, I would like to check a CBC a CMP and lipid panel.  Ideally I like to keep her LDL cholesterol less than 55.  Blood pressure is well-controlled today.

## 2023-05-25 ENCOUNTER — Encounter: Payer: Medicare Other | Admitting: Family Medicine

## 2023-05-25 LAB — COMPLETE METABOLIC PANEL WITH GFR
AG Ratio: 1.9 (calc) (ref 1.0–2.5)
ALT: 13 U/L (ref 6–29)
AST: 15 U/L (ref 10–35)
Albumin: 4.3 g/dL (ref 3.6–5.1)
Alkaline phosphatase (APISO): 69 U/L (ref 37–153)
BUN: 13 mg/dL (ref 7–25)
CO2: 32 mmol/L (ref 20–32)
Calcium: 9.5 mg/dL (ref 8.6–10.4)
Chloride: 102 mmol/L (ref 98–110)
Creat: 0.87 mg/dL (ref 0.60–1.00)
Globulin: 2.3 g/dL (calc) (ref 1.9–3.7)
Glucose, Bld: 100 mg/dL — ABNORMAL HIGH (ref 65–99)
Potassium: 4.4 mmol/L (ref 3.5–5.3)
Sodium: 141 mmol/L (ref 135–146)
Total Bilirubin: 0.5 mg/dL (ref 0.2–1.2)
Total Protein: 6.6 g/dL (ref 6.1–8.1)
eGFR: 72 mL/min/{1.73_m2} (ref >=60)

## 2023-05-25 LAB — CBC WITH DIFFERENTIAL/PLATELET
Absolute Monocytes: 614 cells/uL (ref 200–950)
Basophils Absolute: 41 cells/uL (ref 0–200)
Basophils Relative: 0.6 %
Eosinophils Absolute: 338 cells/uL (ref 15–500)
Eosinophils Relative: 4.9 %
HCT: 40.7 % (ref 35.0–45.0)
Hemoglobin: 13.2 g/dL (ref 11.7–15.5)
Lymphs Abs: 1463 cells/uL (ref 850–3900)
MCH: 28.9 pg (ref 27.0–33.0)
MCHC: 32.4 g/dL (ref 32.0–36.0)
MCV: 89.1 fL (ref 80.0–100.0)
MPV: 9.2 fL (ref 7.5–12.5)
Monocytes Relative: 8.9 %
Neutro Abs: 4444 cells/uL (ref 1500–7800)
Neutrophils Relative %: 64.4 %
Platelets: 319 10*3/uL (ref 140–400)
RBC: 4.57 10*6/uL (ref 3.80–5.10)
RDW: 13.1 % (ref 11.0–15.0)
Total Lymphocyte: 21.2 %
WBC: 6.9 10*3/uL (ref 3.8–10.8)

## 2023-05-25 LAB — LIPID PANEL
Cholesterol: 183 mg/dL (ref <200)
HDL: 85 mg/dL (ref >=50)
LDL Cholesterol (Calc): 85 mg/dL (calc)
Non-HDL Cholesterol (Calc): 98 mg/dL (calc) (ref <130)
Total CHOL/HDL Ratio: 2.2 (calc) (ref <5.0)
Triglycerides: 58 mg/dL (ref <150)

## 2023-06-01 ENCOUNTER — Encounter: Payer: Self-pay | Admitting: Emergency Medicine

## 2023-06-02 MED ORDER — DOXYCYCLINE HYCLATE 100 MG PO TABS
100.0000 mg | ORAL_TABLET | Freq: Two times a day (BID) | ORAL | 0 refills | Status: DC
Start: 2023-06-02 — End: 2023-08-10

## 2023-06-02 NOTE — Telephone Encounter (Signed)
Called and spoke with patient due to nature of message. She stated that she has had a deep, rattling cough for the past week. She has been unable to produce any phlegm but she can feel the congestion in her chest. At times, it feels like her chest is burning from the congestion. Denied any fevers or body aches. She has noticed an slight increase in wheezing and SOB, especially with exertion. She has been using OTC cough syrups and Mucinex without any relief. Confirmed that she is still using the Brovana nebulizer solution, Breztri and albuterol as needed.   She wanted to know if anything could be sent in for her.   Pharmacy is [facility].   Beth, can you please advise since RB is not available today? Thanks!

## 2023-06-02 NOTE — Telephone Encounter (Signed)
I will send in doxycycline for COPD/exacerbation- acute bronchitis. Can she take prednisone, it is listed as an allergy? If she can send in 20mg  daily x 5 days. Continue Mucinex twice daily and inhalers as directed. If not better next week call office for chest xray.

## 2023-06-02 NOTE — Telephone Encounter (Signed)
Spoke with the pt and notified of Beth's response  She states that she is allergic to pred and does not want this  Will call Monday if no better

## 2023-07-07 ENCOUNTER — Other Ambulatory Visit: Payer: Self-pay | Admitting: Emergency Medicine

## 2023-07-08 ENCOUNTER — Other Ambulatory Visit: Payer: Self-pay | Admitting: Emergency Medicine

## 2023-07-11 NOTE — Telephone Encounter (Signed)
Pt. Calling is totally out of inhaler need refill sent in

## 2023-07-13 MED ORDER — ALBUTEROL SULFATE HFA 108 (90 BASE) MCG/ACT IN AERS: INHALATION_SPRAY | RESPIRATORY_TRACT | 2 refills | Status: DC

## 2023-08-10 ENCOUNTER — Ambulatory Visit (INDEPENDENT_AMBULATORY_CARE_PROVIDER_SITE_OTHER): Payer: Medicare Other | Admitting: Family Medicine

## 2023-08-10 ENCOUNTER — Encounter: Payer: Self-pay | Admitting: Family Medicine

## 2023-08-10 VITALS — BP 120/72 | HR 97 | Temp 97.8°F | Ht 62.0 in | Wt 144.8 lb

## 2023-08-10 DIAGNOSIS — G629 Polyneuropathy, unspecified: Secondary | ICD-10-CM

## 2023-08-10 MED ORDER — GABAPENTIN 100 MG PO CAPS: 100.0000 mg | ORAL_CAPSULE | Freq: Three times a day (TID) | ORAL | 0 refills | Status: DC

## 2023-08-10 MED ORDER — GABAPENTIN 300 MG PO CAPS
300.0000 mg | ORAL_CAPSULE | Freq: Three times a day (TID) | ORAL | 3 refills | Status: DC
Start: 2023-08-10 — End: 2024-10-23

## 2023-08-10 NOTE — Progress Notes (Signed)
Subjective:    Patient ID: Taylor Burnett, female    DOB: [birth year], 72 y.o.   MRN: [id number]  Patient has bilateral neuropathy in both feet.  She is taking gabapentin 300 mg 3 times a day and this helps.  However she reports severe leg pain distal to the knees in both legs.  The pain keeps her awake at night.  It is constant aching burning pain.  It is located circumferentially around the leg and radiates from the knees to the toes.  There is no erythema in either leg.  There is no swelling.  There is no pitting edema.  She has 2/4 dorsalis pedis and posterior tibialis pulses bilaterally.  There is no evidence of claudication on her exam.  Nothing makes the pain better.  It is worse at night when she is lying down.  She denies any muscle pain anywhere else on the body.  She does have some chronic low back pain but she denies any previous history of sciatica. Past Medical History:  Diagnosis Date   Arthritis    CAD (coronary artery disease), native coronary artery    mild non obstructive CAD of the RCA, LCX, LAD up to 25-49% by coronary CTA 11/2020   Cataract    COPD (chronic obstructive pulmonary disease) (HCC)    Cough    DDD (degenerative disc disease), cervical    Dysrhythmia    RAPID HEART BEAT OCC    Emphysema of lung (HCC)    GERD (gastroesophageal reflux disease)    OCC   H/O degenerative disc disease    Headache    Myocardial infarction (HCC) 2016   Osteoporosis    Panic attacks    PAT (paroxysmal atrial tachycardia)    noted on event monitor 11/2020   Pre-diabetes    Severe lung disease    Shortness of breath dyspnea    Sleep apnea    Spondylosis    Past Surgical History:  Procedure Laterality Date   ANTERIOR CERVICAL DECOMP/DISCECTOMY FUSION N/A 03/13/2015   Procedure: CERVICAL THREE-CERVICAL FOUR, CERVICAL FOUR-CERVICAL FIVE, CERVICAL FIVE-CERVICAL SIX ANTERIOR CERVICAL DECOMPRESSION/DISCECTOMY FUSION 3 LEVELS;  Surgeon: Tia Alert, MD;  Location: [facility];   Service: Neurosurgery;  Laterality: N/A;   BIOPSY  03/25/2021   Procedure: BIOPSY;  Surgeon: Rachael Fee, MD;  Location: [facility];  Service: Endoscopy;;   BREAST ENHANCEMENT SURGERY  1990   CARDIAC CATHETERIZATION  1995   CATARACT EXTRACTION W/PHACO Right 05/02/2017   Procedure: CATARACT EXTRACTION PHACO AND INTRAOCULAR LENS PLACEMENT (IOC);  Surgeon: Jethro Bolus, MD;  Location: [facility];  Service: Ophthalmology;  Laterality: Right;  CDE: 6.22   CATARACT EXTRACTION W/PHACO Left 05/16/2017   Procedure: CATARACT EXTRACTION PHACO AND INTRAOCULAR LENS PLACEMENT (IOC);  Surgeon: Jethro Bolus, MD;  Location: [facility];  Service: Ophthalmology;  Laterality: Left;  CDE: 4.10   ESOPHAGOGASTRODUODENOSCOPY (EGD) WITH PROPOFOL N/A 03/25/2021   Procedure: ESOPHAGOGASTRODUODENOSCOPY (EGD) WITH PROPOFOL;  Surgeon: Rachael Fee, MD;  Location: [facility];  Service: Endoscopy;  Laterality: N/A;   TONSILLECTOMY     TUBAL LIGATION     VIDEO BRONCHOSCOPY Bilateral 12/31/2018   Procedure: VIDEO BRONCHOSCOPY WITHOUT FLUORO;  Surgeon: Leslye Peer, MD;  Location: [facility];  Service: Cardiopulmonary;  Laterality: Bilateral;   Current Outpatient Medications on File Prior to Visit  Medication Sig Dispense Refill   albuterol (VENTOLIN HFA) 108 (90 Base) MCG/ACT inhaler INHALE 2 PUFFS INTO THE LUNGS EVERY 6 HOURS AS NEEDED. 8.5 g  2   albuterol (VENTOLIN HFA) 108 (90 Base) MCG/ACT inhaler INHALE 2 PUFFS INTO THE LUNGS EVERY 6 HOURS AS NEEDED. 8.5 g 0   arformoterol (BROVANA) 15 MCG/2ML NEBU Use 1 Vial In Nebulizer Twice Daily - Morning And Evening 60 mL 11   aspirin EC 81 MG tablet Take 1 tablet (81 mg total) by mouth daily. Swallow whole. 90 tablet 3   atorvastatin (LIPITOR) 80 MG tablet Take 1 tablet (80 mg total) by mouth daily. 90 tablet 3   Budeson-Glycopyrrol-Formoterol (BREZTRI AEROSPHERE) 160-9-4.8 MCG/ACT AERO INHALE (2) PUFFS INTO THE LUNGS IN THE MORNING AND AT BEDTIME 10.7 g 5   calcium carbonate  (CALCIUM 600) 600 MG TABS tablet Take 2 tablets (1,200 mg total) by mouth daily with breakfast. 30 tablet 0   Cholecalciferol (VITAMIN D) 2000 units CAPS Take 2,000 Units by mouth daily.      diltiazem (CARDIZEM CD) 180 MG 24 hr capsule Take 1 capsule (180 mg total) by mouth daily. 90 capsule 3   fluticasone (FLONASE) 50 MCG/ACT nasal spray Place 2 sprays into both nostrils every evening. 16 mL 12   gabapentin (NEURONTIN) 300 MG capsule Take 1 capsule (300 mg total) by mouth 3 (three) times daily. 90 capsule 3   ipratropium (ATROVENT) 0.02 % nebulizer solution USE 1 VIAL IN NEBULIZER 3 TIMES DAILY 90 mL 11   naproxen sodium (ALEVE) 220 MG tablet Take 440 mg by mouth 2 (two) times daily as needed (pain).      OXYGEN Inhale 2 L into the lungs as needed. FOR USE WITH EXERTION     Current Facility-Administered Medications on File Prior to Visit  Medication Dose Route Frequency Provider Last Rate Last Admin   denosumab (PROLIA) injection 60 mg  60 mg Subcutaneous Q6 months Donita Brooks, MD   60 mg at 02/13/19 1553   Allergies  Allergen Reactions   Prednisone Other (See Comments)    Depression and suicidal   Toprol Xl [Metoprolol] Other (See Comments)    Fatigue   Social History   Socioeconomic History   Marital status: Divorced    Spouse name: Not on file   Number of children: 3   Years of education: Not on file   Highest education level: 12th grade  Occupational History   Occupation: on disability.  Tobacco Use   Smoking status: Former    Current packs/day: 0.00    Average packs/day: 2.0 packs/day for 40.0 years (80.0 ttl pk-yrs)    Types: Cigarettes    Start date: 07/31/1973    Quit date: 07/31/2013    Years since quitting: 10.0   Smokeless tobacco: Never   Tobacco comments:    Would like Lung Ca Screening, quit in 2014. 10/29/2021  Vaping Use   Vaping status: Never Used  Substance and Sexual Activity   Alcohol use: No    Comment: OCC WINE    Drug use: No   Sexual  activity: Not on file  Other Topics Concern   Not on file  Social History Narrative   Divorced and lives alone.    2 sons and 1 daughter   6 grandchildren.    Social Determinants of Health   Financial Resource Strain: Low Risk  (05/18/2023)   Overall Financial Resource Strain (CARDIA)    Difficulty of Paying Living Expenses: Not very hard  Food Insecurity: Food Insecurity Present (05/18/2023)   Hunger Vital Sign    Worried About Running Out of Food in the Last Year: Sometimes true  Ran Out of Food in the Last Year: Patient declined  Transportation Needs: Unknown (05/18/2023)   PRAPARE - Transportation    Lack of Transportation (Medical): No    Lack of Transportation (Non-Medical): Patient declined  Physical Activity: Sufficiently Active (05/18/2023)   Exercise Vital Sign    Days of Exercise per Week: 4 days    Minutes of Exercise per Session: 60 min  Stress: No Stress Concern Present (05/18/2023)   [employer] of Occupational Health - Occupational Stress Questionnaire    Feeling of Stress : Only a little  Social Connections: Unknown (05/18/2023)   Social Connection and Isolation Panel [NHANES]    Frequency of Communication with Friends and Family: More than three times a week    Frequency of Social Gatherings with Friends and Family: More than three times a week    Attends Religious Services: Patient declined    Database administrator or Organizations: Yes    Attends Banker Meetings: Patient declined    Marital Status: Widowed  Intimate Partner Violence: Not At Risk (10/29/2021)   Humiliation, Afraid, Rape, and Kick questionnaire    Fear of Current or Ex-Partner: No    Emotionally Abused: No    Physically Abused: No    Sexually Abused: No      Review of Systems     Objective:   Physical Exam Vitals reviewed.  Constitutional:      General: She is not in acute distress.    Appearance: She is not diaphoretic.  HENT:     Head: Normocephalic and  atraumatic.     Nose: Nose normal.  Eyes:     Conjunctiva/sclera: Conjunctivae normal.     Pupils: Pupils are equal, round, and reactive to light.  Cardiovascular:     Rate and Rhythm: Normal rate. Rhythm irregular.     Heart sounds: Normal heart sounds. No murmur heard.    No friction rub. No gallop.  Pulmonary:     Effort: Pulmonary effort is normal.     Breath sounds: Examination of the right-lower field reveals rales. Examination of the left-lower field reveals rales. Rales present. No wheezing or rhonchi.  Abdominal:     Palpations: Abdomen is soft.  Musculoskeletal:     Cervical back: Neck supple.     Right foot: Normal range of motion. No swelling, deformity, tenderness or bony tenderness. Normal pulse.     Left foot: Normal range of motion. No swelling, deformity, tenderness or bony tenderness. Normal pulse.    Patient's muscle strength is 4/5 equal and symmetric in both legs when checked with knee extension, knee flexion, ankle flexion and extension.  She has normal sensation.  Reflexes are 2/4 equal and symmetric bilaterally at the patella.  Visibly there are no skin abnormalities     Assessment & Plan:  Polyneuropathy I believe the patient is dealing with neuropathic pain.  Increase gabapentin to 400 mg 3 times a day.  Plan to uptitrate as necessary to 600 mg 3 times a day if beneficial

## 2023-08-28 ENCOUNTER — Encounter: Payer: Self-pay | Admitting: Emergency Medicine

## 2023-08-31 ENCOUNTER — Encounter (HOSPITAL_COMMUNITY)
Admission: RE | Admit: 2023-08-31 | Discharge: 2023-08-31 | Disposition: A | Discharge status: Home / Self Care | Payer: Medicare Other | Source: Ambulatory Visit | Attending: Emergency Medicine | Admitting: Emergency Medicine

## 2023-08-31 DIAGNOSIS — R918 Other nonspecific abnormal finding of lung field: Secondary | ICD-10-CM | POA: Diagnosis not present

## 2023-08-31 LAB — GLUCOSE, CAPILLARY: Glucose-Capillary: 89 mg/dL (ref 70–99)

## 2023-08-31 MED ORDER — FLUDEOXYGLUCOSE F - 18 (FDG) INJECTION
7.1700 | Freq: Once | INTRAVENOUS | Status: AC
  Administered 2023-08-31: 7.17 via INTRAVENOUS

## 2023-09-10 NOTE — Telephone Encounter (Signed)
Patient had the scan unfortunately [facility] the only place in [place] that does PET scans

## 2023-09-14 ENCOUNTER — Ambulatory Visit (INDEPENDENT_AMBULATORY_CARE_PROVIDER_SITE_OTHER): Payer: Medicare Other | Admitting: Emergency Medicine

## 2023-09-14 ENCOUNTER — Encounter: Payer: Self-pay | Admitting: Emergency Medicine

## 2023-09-14 VITALS — BP 114/76 | HR 92 | Resp 18 | Ht 62.0 in | Wt 143.8 lb

## 2023-09-14 DIAGNOSIS — J438 Other emphysema: Secondary | ICD-10-CM

## 2023-09-14 DIAGNOSIS — Z23 Encounter for immunization: Secondary | ICD-10-CM

## 2023-09-14 DIAGNOSIS — R918 Other nonspecific abnormal finding of lung field: Secondary | ICD-10-CM | POA: Diagnosis not present

## 2023-09-14 NOTE — Addendum Note (Signed)
Addended by: Jama Flavors on: 09/14/2023 04:16 PM   Modules accepted: Orders

## 2023-09-14 NOTE — Assessment & Plan Note (Signed)
Right middle lobe pulmonary nodule that has changed in size very slowly going back to 2015 and patient with Mycobacterium gordonae colonization.  Suspicious for possible slow-growing adenocarcinoma.  The PET scan was reassuring, shows no change in size and no hypermetabolism.  We will plan to repeat her CT chest in 1 year.  She would probably be a candidate for empiric SBRT if the nodule increases in size.

## 2023-09-14 NOTE — Assessment & Plan Note (Signed)
Continue your Breztri 2 puffs twice a day.  Rinse and gargle after using. Continue Brovana twice a day as you have been doing it. Keep your DuoNeb available to use up to every 6 hours if needed for shortness of breath, chest tightness, wheezing. Please start azithromycin 250 mg once daily.  Take this medication every day until next visit.  At that time we will decide whether it has benefited you. Flu shot today Agree with getting the RSV vaccine and the COVID-19 vaccine this fall Follow-up with APP in 2 months Follow Dr. Delton Coombes in 6 months, sooner if you have problems.

## 2023-09-14 NOTE — Patient Instructions (Signed)
We reviewed your PET scan today. We will plan to repeat your CT scan of the chest in 1 year to follow right middle lobe pulmonary nodule (September 2025) Continue your Breztri 2 puffs twice a day.  Rinse and gargle after using. Continue Brovana twice a day as you have been doing it. Keep your DuoNeb available to use up to every 6 hours if needed for shortness of breath, chest tightness, wheezing. Please start azithromycin 250 mg once daily.  Take this medication every day until next visit.  At that time we will decide whether it has benefited you. Flu shot today Agree with getting the RSV vaccine and the COVID-19 vaccine this fall Follow-up with APP in 2 months Follow Dr. Delton Coombes in 6 months, sooner if you have problems.

## 2023-09-14 NOTE — Progress Notes (Signed)
Subjective:    Patient ID: Taylor Burnett, female    DOB: 25-Aug-1951, 72 y.o.   MRN: [id number]  Shortness of Breath   ROV 09/14/2023 --follow-up visit for 72 year old woman with very severe COPD.  She has a history of pulmonary nodular disease in the setting of Mycobacterium gordonae colonization.  She also has hypoxemic respiratory failure on oxygen at 2 L/min.  She has been managed on [place], DuoNeb as needed.  At her last visit her CT chest showed an increase in size of solid component of her lateral right middle lobe nodule, question slow-growing adenocarcinoma.  We decided to follow conservatively and repeat a PET scan as below. She continues to have cough, worse since 3-4 months ago. Feels deep, produces white thick mucous. She got doxycycline and pred x 5 days in June.   PET scan done on 08/31/2023 and reviewed by me, shows her 9 mm lateral right middle lobe nodule without any hypermetabolism.  Possibly slightly less prominent than on the preceding CT chest March/2024.    Review of Systems  Respiratory:  Positive for shortness of breath.    As per HPI     Objective:   Physical Exam Vitals:   09/14/23 1545  BP: 114/76  Pulse: 92  Resp: 18  SpO2: 92%  Weight: 143 lb 12.8 oz (65.2 kg)  Height: 5\' 2"  (1.575 m)    Gen: Pleasant, well-nourished, in no distress,  normal affect  ENT: No lesions,  mouth clear,  oropharynx clear, no postnasal drip  Neck: No JVD, no stridor  Lungs: No use of accessory muscles, very distant, clear, no wheeze on normal breath.   Cardiovascular: RRR, heart sounds normal, no murmur or gallops, no peripheral edema  Musculoskeletal: No deformities, no cyanosis or clubbing  Neuro: alert, non focal  Skin: Warm, no lesions or rashes      Assessment & Plan:  Pulmonary nodules Right middle lobe pulmonary nodule that has changed in size very slowly going back to 2015 and patient with Mycobacterium gordonae colonization.  Suspicious  for possible slow-growing adenocarcinoma.  The PET scan was reassuring, shows no change in size and no hypermetabolism.  We will plan to repeat her CT chest in 1 year.  She would probably be a candidate for empiric SBRT if the nodule increases in size.  COPD (chronic obstructive pulmonary disease) with emphysema (HCC) Continue your Breztri 2 puffs twice a day.  Rinse and gargle after using. Continue Brovana twice a day as you have been doing it. Keep your DuoNeb available to use up to every 6 hours if needed for shortness of breath, chest tightness, wheezing. Please start azithromycin 250 mg once daily.  Take this medication every day until next visit.  At that time we will decide whether it has benefited you. Flu shot today Agree with getting the RSV vaccine and the COVID-19 vaccine this fall Follow-up with APP in 2 months Follow Dr. Delton Coombes in 6 months, sooner if you have problems.   Levy Pupa, MD, PhD 09/14/2023, 4:07 PM [facility] [phone] or if no answer [phone]

## 2023-09-15 ENCOUNTER — Telehealth: Payer: Self-pay | Admitting: Emergency Medicine

## 2023-09-15 NOTE — Telephone Encounter (Signed)
Pt calling in to get a presciption sent in for  azithromycin 250 mg  [place]

## 2023-09-16 ENCOUNTER — Encounter: Payer: Self-pay | Admitting: Emergency Medicine

## 2023-09-18 ENCOUNTER — Other Ambulatory Visit: Payer: Self-pay | Admitting: Family Medicine

## 2023-09-18 ENCOUNTER — Encounter: Payer: Self-pay | Admitting: Family Medicine

## 2023-09-18 MED ORDER — GABAPENTIN 100 MG PO CAPS: 100.0000 mg | ORAL_CAPSULE | Freq: Three times a day (TID) | ORAL | 11 refills | Status: DC

## 2023-09-18 MED ORDER — ALBUTEROL SULFATE HFA 108 (90 BASE) MCG/ACT IN AERS: INHALATION_SPRAY | RESPIRATORY_TRACT | 2 refills | Status: DC

## 2023-09-18 MED ORDER — AZITHROMYCIN 250 MG PO TABS: 250.0000 mg | ORAL_TABLET | Freq: Every day | ORAL | 0 refills | Status: DC

## 2023-09-18 NOTE — Telephone Encounter (Signed)
Per OV 09/14/23: Please start azithromycin 250 mg once daily. Take this medication every day until next visit. At that time we will decide whether it has benefited you.   This had not been sent in to pharmacy as of today. Rx sent.   Patient advised via MyChart.

## 2023-09-18 NOTE — Addendum Note (Signed)
Addended by: Jacquiline Doe on: 09/18/2023 02:14 PM   Modules accepted: Orders

## 2023-09-18 NOTE — Telephone Encounter (Signed)
Taylor Burnett from [place] would like to verify quantity of Azithromycin. Taylor Burnett phone number is [phone].

## 2023-09-18 NOTE — Telephone Encounter (Signed)
Called [facility] and verified Azithromycin prescription.  Nothing further at this time.

## 2023-09-19 NOTE — Telephone Encounter (Signed)
Called pharmacy to clarify confirmation of Rx. After multiple rings and no one answering call disconnected.

## 2023-09-19 NOTE — Telephone Encounter (Signed)
Requested by interface surescripts. Duplicate request. Receipt confirmed by pharmacy 09/18/23 at 2:01 pm. Called pharmacy to confirm and no answer, last OV 08/10/23.  Requested Prescriptions  Refused Prescriptions Disp Refills   gabapentin (NEURONTIN) 100 MG capsule [Pharmacy Med Name: gabapentin 100 mg capsule] 90 capsule 0    Sig: TAKE ONE CAPSULE BY MOUTH 3 TIMES A DAY. TAKE WITH 300MG  TO MAKE 400 TOTAL     Neurology: Anticonvulsants - gabapentin Failed - 09/18/2023 10:28 AM      Failed - Valid encounter within last 12 months    Recent Outpatient Visits           1 year ago Acute pain of right shoulder   [facility] Pickard, Priscille Heidelberg, MD   2 years ago Neuropathy   [facility] Tanya Nones Priscille Heidelberg, MD   2 years ago Chest pain, unspecified type   [facility] Donita Brooks, MD   3 years ago Rib pain on left side   [facility] Donita Brooks, MD   3 years ago Low back pain at multiple sites   [facility] Pickard, Priscille Heidelberg, MD              Passed - Cr in normal range and within 360 days    Creat  Date Value Ref Range Status  05/24/2023 0.87 0.60 - 1.00 mg/dL Final         Passed - Completed PHQ-2 or PHQ-9 in the last 360 days

## 2023-09-21 NOTE — Telephone Encounter (Signed)
Pt seen 09/18/23 as message was on 09/15/23.

## 2023-11-02 DIAGNOSIS — H26491 Other secondary cataract, right eye: Secondary | ICD-10-CM | POA: Diagnosis not present

## 2023-11-02 DIAGNOSIS — H353121 Nonexudative age-related macular degeneration, left eye, early dry stage: Secondary | ICD-10-CM | POA: Diagnosis not present

## 2023-11-02 DIAGNOSIS — H43812 Vitreous degeneration, left eye: Secondary | ICD-10-CM | POA: Diagnosis not present

## 2023-11-02 DIAGNOSIS — Z961 Presence of intraocular lens: Secondary | ICD-10-CM | POA: Diagnosis not present

## 2023-11-02 DIAGNOSIS — G4733 Obstructive sleep apnea (adult) (pediatric): Secondary | ICD-10-CM | POA: Diagnosis not present

## 2023-11-02 DIAGNOSIS — H04123 Dry eye syndrome of bilateral lacrimal glands: Secondary | ICD-10-CM | POA: Diagnosis not present

## 2023-11-07 ENCOUNTER — Other Ambulatory Visit: Payer: Self-pay | Admitting: Emergency Medicine

## 2023-11-14 ENCOUNTER — Ambulatory Visit: Payer: Medicare Other | Admitting: Adult Health

## 2024-01-02 ENCOUNTER — Ambulatory Visit: Payer: Medicare Other | Admitting: Adult Health

## 2024-02-01 ENCOUNTER — Other Ambulatory Visit: Payer: Self-pay | Admitting: Family Medicine

## 2024-02-21 ENCOUNTER — Other Ambulatory Visit: Payer: Self-pay | Admitting: Emergency Medicine

## 2024-02-29 ENCOUNTER — Other Ambulatory Visit: Payer: Self-pay | Admitting: Adult Health

## 2024-02-29 DIAGNOSIS — R053 Chronic cough: Secondary | ICD-10-CM

## 2024-02-29 DIAGNOSIS — J438 Other emphysema: Secondary | ICD-10-CM

## 2024-03-01 ENCOUNTER — Telehealth: Payer: Self-pay | Admitting: Adult Health

## 2024-03-05 ENCOUNTER — Ambulatory Visit: Payer: Medicare Other | Admitting: Emergency Medicine

## 2024-03-07 NOTE — Telephone Encounter (Signed)
 Fax confirmation received 03/07/24

## 2024-03-21 ENCOUNTER — Ambulatory Visit: Payer: Medicare Other | Admitting: Emergency Medicine

## 2024-03-21 ENCOUNTER — Encounter: Payer: Self-pay | Admitting: Emergency Medicine

## 2024-03-21 VITALS — BP 114/76 | HR 90 | Ht 63.0 in | Wt 148.6 lb

## 2024-03-21 DIAGNOSIS — R918 Other nonspecific abnormal finding of lung field: Secondary | ICD-10-CM | POA: Diagnosis not present

## 2024-03-21 DIAGNOSIS — G4734 Idiopathic sleep related nonobstructive alveolar hypoventilation: Secondary | ICD-10-CM

## 2024-03-21 DIAGNOSIS — J438 Other emphysema: Secondary | ICD-10-CM | POA: Diagnosis not present

## 2024-03-21 NOTE — Progress Notes (Signed)
   Subjective:    Patient ID: Taylor Burnett, female    DOB: 27-Feb-1951, 73 y.o.   MRN: [id number]  Shortness of Breath   ROV 09/14/2023 --follow-up visit for 73 year old woman with very severe COPD.  She has a history of pulmonary nodular disease in the setting of Mycobacterium gordonae colonization.  She also has hypoxemic respiratory failure on oxygen at 2 L/min.  She has been managed on [place], DuoNeb as needed.  At her last visit her CT chest showed an increase in size of solid component of her lateral right middle lobe nodule, question slow-growing adenocarcinoma.  We decided to follow conservatively and repeat a PET scan as below. She continues to have cough, worse since 3-4 months ago. Feels deep, produces white thick mucous. She got doxycycline and pred x 5 days in June.   PET scan done on 08/31/2023 and reviewed by me, shows her 9 mm lateral right middle lobe nodule without any hypermetabolism.  Possibly slightly less prominent than on the preceding CT chest March/2024.  ROV 03/21/2024 --Taylor Burnett is 79 with a history of very severe COPD, Mycobacterium gordonae colonization with associated pulmonary nodular disease, chronic hypoxemic respiratory failure.  We have been following serial imaging of her pulmonary nodules.  PET scan from September was stable, reassuring. Her breathing is limiting, on 2L/min, has to stop and rest after a short walk. Coughs daily, dry. She is on brovana, breztri, ipratropium nebs. No longer on azithro, caused GI upset. She sleeps poorly, does have some SOB at night > has seen desats on her 2L/min at night   Review of Systems  Respiratory:  Positive for shortness of breath.    As per HPI     Objective:   Physical Exam Vitals:   03/21/24 1535  BP: 114/76  Pulse: 90  SpO2: 93%  Weight: 148 lb 9.6 oz (67.4 kg)  Height: 5\' 3"  (1.6 m)    Gen: Pleasant, well-nourished, in no distress,  normal affect  ENT: No lesions,  mouth clear,  oropharynx  clear, no postnasal drip  Neck: No JVD, no stridor  Lungs: No use of accessory muscles, very distant, clear, no wheeze on normal breath.   Cardiovascular: RRR, heart sounds normal, no murmur or gallops, no peripheral edema  Musculoskeletal: No deformities, no cyanosis or clubbing  Neuro: alert, non focal  Skin: Warm, no lesions or rashes      Assessment & Plan:  COPD (chronic obstructive pulmonary disease) with emphysema (HCC) Please continue Breztri 2 puffs twice a day.  Rinse and gargle after using. Continue your Brovana nebulizer twice a day on a schedule Continue your ipratropium nebulizer as you have been taking it Use albuterol up to every 4 hours if needed for shortness of breath, chest tightness, wheezing. Follow with Dr Delton Coombes in 6 months or sooner if you have any problems  Sleep related hypoxia Increase nocturnal oxygen at 3 L/min as she has seen some desaturations.  Pulmonary nodules In the setting of Mycobacterium gordonae colonization.  Waxing and waning but overall stable.  Plan to repeat her CT chest in September 2025.    Taylor Pupa, MD, PhD 03/21/2024, 3:57 PM [facility] [phone] or if no answer [phone]

## 2024-03-21 NOTE — Assessment & Plan Note (Signed)
 Please continue Breztri 2 puffs twice a day.  Rinse and gargle after using. Continue your Brovana nebulizer twice a day on a schedule Continue your ipratropium nebulizer as you have been taking it Use albuterol up to every 4 hours if needed for shortness of breath, chest tightness, wheezing. Follow with Dr Delton Coombes in 6 months or sooner if you have any problems

## 2024-03-21 NOTE — Assessment & Plan Note (Signed)
 Increase nocturnal oxygen at 3 L/min as she has seen some desaturations.

## 2024-03-21 NOTE — Assessment & Plan Note (Signed)
 In the setting of Mycobacterium gordonae colonization.  Waxing and waning but overall stable.  Plan to repeat her CT chest in September 2025.

## 2024-03-21 NOTE — Patient Instructions (Signed)
 Please continue Breztri 2 puffs twice a day.  Rinse and gargle after using. Continue your Brovana nebulizer twice a day on a schedule Continue your ipratropium nebulizer as you have been taking it Use albuterol up to every 4 hours if needed for shortness of breath, chest tightness, wheezing. Continue your oxygen at 2-3 L/min with exertion with goal to keep your saturations > 90%. Please increase your oxygen at night to 3 L/min while sleeping. We will plan to repeat your CT scan of the chest to follow pulmonary nodules in September 2025. Follow with Taylor Burnett in 6 months or sooner if you have any problems

## 2024-04-05 ENCOUNTER — Telehealth: Payer: Self-pay | Admitting: Adult Health

## 2024-04-05 NOTE — Telephone Encounter (Signed)
 Cmn received from [employer]  for Ipratropium and Arformoterol.

## 2024-04-09 ENCOUNTER — Ambulatory Visit

## 2024-04-19 NOTE — Telephone Encounter (Signed)
 Contacted [employer] to correct CMN with Dr. Georgiana Kirks name

## 2024-04-24 ENCOUNTER — Ambulatory Visit

## 2024-04-24 VITALS — Ht 63.0 in | Wt 148.0 lb

## 2024-04-24 DIAGNOSIS — Z0000 Encounter for general adult medical examination without abnormal findings: Secondary | ICD-10-CM | POA: Diagnosis not present

## 2024-04-24 NOTE — Patient Instructions (Signed)
 Taylor Burnett , Thank you for taking time to come for your Medicare Wellness Visit. I appreciate your ongoing commitment to your health goals. Please review the following plan we discussed and let me know if I can assist you in the future.   Referrals/Orders/Follow-Ups/Clinician Recommendations: Aim for 30 minutes of exercise or brisk walking, 6-8 glasses of water , and 5 servings of fruits and vegetables each day.  This is a list of the screening recommended for you and due dates:  Health Maintenance  Topic Date Due   Zoster (Shingles) Vaccine (1 of 2) Never done   Mammogram  06/14/2020   COVID-19 Vaccine (3 - 2024-25 season) 08/27/2023   Screening for Lung Cancer  03/07/2024   Cologuard (Stool DNA test)  07/15/2024   Flu Shot  07/26/2024   Medicare Annual Wellness Visit  04/24/2025   DTaP/Tdap/Td vaccine (2 - Td or Tdap) 06/06/2026   Pneumonia Vaccine  Completed   DEXA scan (bone density measurement)  Completed   Hepatitis C Screening  Completed   HPV Vaccine  Aged Out   Meningitis B Vaccine  Aged Out    Advanced directives: (ACP Link)Information on Advanced Care Planning can be found at [employer] [facility] Advance Health Care Directives Advance Health Care Directives. [email or web address]   Next Medicare Annual Wellness Visit scheduled for next year: Yes

## 2024-04-24 NOTE — Progress Notes (Signed)
 Subjective:   Taylor Burnett is a 73 y.o. who presents for a Medicare Wellness preventive visit.  Visit Complete: Virtual I connected with  Taylor Burnett on 04/24/24 by a audio enabled telemedicine application and verified that I am speaking with the correct person using two identifiers.  Patient Location: Home  Provider Location: Home Office  I discussed the limitations of evaluation and management by telemedicine. The patient expressed understanding and agreed to proceed.  Vital Signs: Because this visit was a virtual/telehealth visit, some criteria may be missing or patient reported. Any vitals not documented were not able to be obtained and vitals that have been documented are patient reported.  VideoDeclined- This patient declined Librarian, academic. Therefore the visit was completed with audio only.  Persons Participating in Visit: Patient.  AWV Questionnaire: No: Patient Medicare AWV questionnaire was not completed prior to this visit.  Cardiac Risk Factors include: advanced age (>90men, >14 women);sedentary lifestyle     Objective:    Today's Vitals   04/24/24 1607  Weight: 148 lb (67.1 kg)  Height: 5\' 3"  (1.6 m)   Body mass index is 26.22 kg/m.     04/24/2024    4:18 PM 12/02/2022    3:40 PM 03/23/2022    1:04 PM 11/05/2021    1:34 PM 10/29/2021    9:58 AM 03/25/2021    6:54 AM 04/29/2019    4:42 PM  Advanced Directives  Does Patient Have a Medical Advance Directive? No No No No No No No  Would patient like information on creating a medical advance directive? Yes (MAU/Ambulatory/Procedural Areas - Information given)  Yes (MAU/Ambulatory/Procedural Areas - Information given) No - Patient declined No - Patient declined No - Patient declined Yes (ED - Information included in AVS)    Current Medications (verified) Outpatient Encounter Medications as of 04/24/2024  Medication Sig   albuterol  (VENTOLIN  HFA) 108 (90 Base) MCG/ACT inhaler  INHALE 2 PUFFS INTO THE LUNGS EVERY 6 HOURS AS NEEDED.   albuterol  (VENTOLIN  HFA) 108 (90 Base) MCG/ACT inhaler INHALE 2 PUFFS INTO THE LUNGS EVERY 6 HOURS AS NEEDED.   arformoterol  (BROVANA ) 15 MCG/2ML NEBU Use 1 Vial In Nebulizer Twice Daily - Morning And Evening   aspirin  EC 81 MG tablet Take 1 tablet (81 mg total) by mouth daily. Swallow whole.   BREZTRI  AEROSPHERE 160-9-4.8 MCG/ACT AERO INHALE (2) PUFFS INTO THE LUNGS IN THE MORNING AND AT BEDTIME   Cholecalciferol (VITAMIN D) 2000 units CAPS Take 2,000 Units by mouth daily.    gabapentin  (NEURONTIN ) 100 MG capsule Take 1 capsule (100 mg total) by mouth 3 (three) times daily. Take with 300mg  to make 400 total.   gabapentin  (NEURONTIN ) 300 MG capsule Take 1 capsule (300 mg total) by mouth 3 (three) times daily.   gabapentin  (NEURONTIN ) 300 MG capsule Take 1 capsule (300 mg total) by mouth 3 (three) times daily. Take with 100mg  to make 400 total.   gabapentin  (NEURONTIN ) 300 MG capsule TAKE ONE CAPSULE BY MOUTH 3 TIMES A DAY WITH 100MG  TABLET TO MAKE 400MG  TOTAL DAILY   ipratropium (ATROVENT ) 0.02 % nebulizer solution USE 1 VIAL IN NEBULIZER 3 TIMES DAILY   naproxen sodium (ALEVE) 220 MG tablet Take 440 mg by mouth 2 (two) times daily as needed (pain).    OXYGEN  Inhale 2 L into the lungs as needed. FOR USE WITH EXERTION   [DISCONTINUED] atorvastatin  (LIPITOR) 80 MG tablet Take 1 tablet (80 mg total) by mouth daily.   [  DISCONTINUED] azithromycin  (ZITHROMAX ) 250 MG tablet Take 1 tablet (250 mg total) by mouth daily. (Patient not taking: Reported on 03/21/2024)   [DISCONTINUED] calcium  carbonate (CALCIUM  600) 600 MG TABS tablet Take 2 tablets (1,200 mg total) by mouth daily with breakfast.   [DISCONTINUED] diltiazem  (CARDIZEM  CD) 180 MG 24 hr capsule Take 1 capsule (180 mg total) by mouth daily.   [DISCONTINUED] fluticasone  (FLONASE ) 50 MCG/ACT nasal spray Place 2 sprays into both nostrils every evening.   Facility-Administered Encounter  Medications as of 04/24/2024  Medication   denosumab  (PROLIA ) injection 60 mg    Allergies (verified) Prednisone  and Toprol  xl [metoprolol ]   History: Past Medical History:  Diagnosis Date   Arthritis    CAD (coronary artery disease), native coronary artery    mild non obstructive CAD of the RCA, LCX, LAD up to 25-49% by coronary CTA 11/2020   Cataract    COPD (chronic obstructive pulmonary disease) (HCC)    Cough    DDD (degenerative disc disease), cervical    Dysrhythmia    RAPID HEART BEAT OCC    Emphysema of lung (HCC)    GERD (gastroesophageal reflux disease)    OCC   H/O degenerative disc disease    Headache    Myocardial infarction (HCC) 2016   Neuromuscular disorder (HCC)    Osteoporosis    Oxygen  deficiency    Panic attacks    PAT (paroxysmal atrial tachycardia) (HCC)    noted on event monitor 11/2020   Pre-diabetes    Severe lung disease    Shortness of breath dyspnea    Sleep apnea    Spondylosis    Past Surgical History:  Procedure Laterality Date   ANTERIOR CERVICAL DECOMP/DISCECTOMY FUSION N/A 03/13/2015   Procedure: CERVICAL THREE-CERVICAL FOUR, CERVICAL FOUR-CERVICAL FIVE, CERVICAL FIVE-CERVICAL SIX ANTERIOR CERVICAL DECOMPRESSION/DISCECTOMY FUSION 3 LEVELS;  Surgeon: Isadora Mar, MD;  Location: [facility];  Service: Neurosurgery;  Laterality: N/A;   BIOPSY  03/25/2021   Procedure: BIOPSY;  Surgeon: Janel Medford, MD;  Location: [facility];  Service: Endoscopy;;   BREAST ENHANCEMENT SURGERY  1990   CARDIAC CATHETERIZATION  1995   CATARACT EXTRACTION W/PHACO Right 05/02/2017   Procedure: CATARACT EXTRACTION PHACO AND INTRAOCULAR LENS PLACEMENT (IOC);  Surgeon: Albert Huff, MD;  Location: [facility];  Service: Ophthalmology;  Laterality: Right;  CDE: 6.22   CATARACT EXTRACTION W/PHACO Left 05/16/2017   Procedure: CATARACT EXTRACTION PHACO AND INTRAOCULAR LENS PLACEMENT (IOC);  Surgeon: Albert Huff, MD;  Location: [facility];  Service: Ophthalmology;   Laterality: Left;  CDE: 4.10   ESOPHAGOGASTRODUODENOSCOPY (EGD) WITH PROPOFOL  N/A 03/25/2021   Procedure: ESOPHAGOGASTRODUODENOSCOPY (EGD) WITH PROPOFOL ;  Surgeon: Janel Medford, MD;  Location: [facility];  Service: Endoscopy;  Laterality: N/A;   EYE SURGERY     SPINE SURGERY     TONSILLECTOMY     TUBAL LIGATION     VIDEO BRONCHOSCOPY Bilateral 12/31/2018   Procedure: VIDEO BRONCHOSCOPY WITHOUT FLUORO;  Surgeon: Denson Flake, MD;  Location: [facility];  Service: Cardiopulmonary;  Laterality: Bilateral;   Family History  Problem Relation Age of Onset   Emphysema Father    Alcohol abuse Father    COPD Father    Allergies Mother    Heart disease Mother    Allergies Son    Allergies Brother    Arthritis Brother    Heart disease Maternal Grandmother    Diabetes Maternal Grandmother    Rheum arthritis Brother    Asthma Brother  Cancer Brother    Hearing loss Brother    Cancer Other        aunts and uncles   Social History   Socioeconomic History   Marital status: Divorced    Spouse name: Not on file   Number of children: 3   Years of education: Not on file   Highest education level: 11th grade  Occupational History   Occupation: on disability.  Tobacco Use   Smoking status: Former    Current packs/day: 0.00    Average packs/day: 2.0 packs/day for 40.0 years (80.0 ttl pk-yrs)    Types: Cigarettes    Start date: 07/31/1973    Quit date: 07/31/2013    Years since quitting: 10.7   Smokeless tobacco: Never   Tobacco comments:    Would like Lung Ca Screening, quit in 2014. 10/29/2021  Vaping Use   Vaping status: Never Used  Substance and Sexual Activity   Alcohol use: No    Comment: OCC WINE    Drug use: No   Sexual activity: Never  Other Topics Concern   Not on file  Social History Narrative   Divorced and lives alone.    2 sons and 1 daughter   6 grandchildren.    Social Drivers of Health   Financial Resource Strain: Medium Risk (04/24/2024)   Overall  Financial Resource Strain (CARDIA)    Difficulty of Paying Living Expenses: Somewhat hard  Food Insecurity: Food Insecurity Present (04/24/2024)   Hunger Vital Sign    Worried About Running Out of Food in the Last Year: Sometimes true    Ran Out of Food in the Last Year: Never true  Transportation Needs: No Transportation Needs (04/24/2024)   PRAPARE - Administrator, Civil Service (Medical): No    Lack of Transportation (Non-Medical): No  Physical Activity: Inactive (04/24/2024)   Exercise Vital Sign    Days of Exercise per Week: 0 days    Minutes of Exercise per Session: 0 min  Stress: No Stress Concern Present (04/24/2024)   [employer] of Occupational Health - Occupational Stress Questionnaire    Feeling of Stress : Not at all  Social Connections: Unknown (04/24/2024)   Social Connection and Isolation Panel [NHANES]    Frequency of Communication with Friends and Family: More than three times a week    Frequency of Social Gatherings with Friends and Family: Twice a week    Attends Religious Services: Patient declined    Database administrator or Organizations: No    Attends Banker Meetings: Never    Marital Status: Divorced    Tobacco Counseling Counseling given: Not Answered Tobacco comments: Would like Lung Ca Screening, quit in 2014. 10/29/2021    Clinical Intake:  Pre-visit preparation completed: Yes  Pain : No/denies pain     Diabetes: No  Lab Results  Component Value Date   HGBA1C 5.5 04/20/2018   HGBA1C 5.2 06/19/2017   HGBA1C 5.4 01/23/2017     How often do you need to have someone help you when you read instructions, pamphlets, or other written materials from your doctor or pharmacy?: 1 - Never  Interpreter Needed?: No  Information entered by :: Seabron Cypress LPN   Activities of Daily Living     04/24/2024    4:17 PM  In your present state of health, do you have any difficulty performing the following activities:   Hearing? 0  Vision? 0  Difficulty concentrating or making decisions? 0  Walking or climbing stairs? 1  Dressing or bathing? 0  Doing errands, shopping? 0  Preparing Food and eating ? N  Using the Toilet? N  In the past six months, have you accidently leaked urine? N  Do you have problems with loss of bowel control? N  Managing your Medications? N  Managing your Finances? N  Housekeeping or managing your Housekeeping? N    Patient Care Team: Austine Lefort, MD as PCP - General (Family Medicine) Jacqueline Matsu, MD as PCP - Cardiology (Cardiology) Myrle Aspen, [facility] (Inactive) (Pharmacist)  Indicate any recent Medical Services you may have received from other than [facility] providers in the past year (date may be approximate).     Assessment:   This is a routine wellness examination for [place].  Hearing/Vision screen Hearing Screening - Comments:: Denies hearing difficulties   Vision Screening - Comments:: Wears rx glasses - up to date with routine eye exams with [facility]    Goals Addressed             This Visit's Progress    Remain active and independent         Depression Screen     04/24/2024    4:14 PM 05/24/2023    2:56 PM 05/24/2023    2:53 PM 12/02/2022    3:35 PM 11/05/2021    1:29 PM 10/29/2021    9:54 AM 07/20/2021   12:45 PM  PHQ 2/9 Scores  PHQ - 2 Score 0 0 0 0 0 0 3  PHQ- 9 Score  9     10    Fall Risk     04/24/2024    4:17 PM 05/24/2023    2:53 PM 11/05/2021    1:29 PM 10/29/2021    9:58 AM 07/20/2021   12:45 PM  Fall Risk   Falls in the past year? 0 0 0 0 0  Number falls in past yr: 0 0  0 0  Injury with Fall? 0 0  0 0  Risk for fall due to : No Fall Risks No Fall Risks  Impaired mobility;Other (Comment);Impaired balance/gait No Fall Risks  Risk for fall due to: Comment    COPD   Follow up Falls prevention discussed;Education provided;Falls evaluation completed Falls prevention discussed  Falls prevention discussed Falls  evaluation completed    MEDICARE RISK AT HOME:  Medicare Risk at Home Any stairs in or around the home?: No If so, are there any without handrails?: No Home free of loose throw rugs in walkways, pet beds, electrical cords, etc?: Yes Adequate lighting in your home to reduce risk of falls?: Yes Life alert?: No Use of a cane, walker or w/c?: No Grab bars in the bathroom?: Yes Shower chair or bench in shower?: No Elevated toilet seat or a handicapped toilet?: Yes  TIMED UP AND GO:  Was the test performed?  No  Cognitive Function: 6CIT completed        04/24/2024    4:18 PM 12/02/2022    3:37 PM 10/29/2021   10:01 AM  6CIT Screen  What Year? 0 points 0 points 0 points  What month? 0 points 0 points 0 points  What time? 0 points 0 points 0 points  Count back from 20 0 points 0 points 0 points  Months in reverse 0 points 0 points 0 points  Repeat phrase 0 points 0 points 0 points  Total Score 0 points 0 points 0 points  Immunizations Immunization History  Administered Date(s) Administered   Fluad Quad(high Dose 65+) 11/12/2021, 09/07/2022   Fluad Trivalent(High Dose 65+) 09/14/2023   PFIZER Comirnaty(Gray Top)Covid-19 Tri-Sucrose Vaccine 01/28/2021   PFIZER(Purple Top)SARS-COV-2 Vaccination 01/07/2021   Pneumococcal Conjugate-13 07/30/2019   Pneumococcal Polysaccharide-23 04/20/2018   Tdap 06/06/2016    Screening Tests Health Maintenance  Topic Date Due   Zoster Vaccines- Shingrix (1 of 2) Never done   MAMMOGRAM  06/14/2020   COVID-19 Vaccine (3 - 2024-25 season) 08/27/2023   Lung Cancer Screening  03/07/2024   Fecal DNA (Cologuard)  07/15/2024   INFLUENZA VACCINE  07/26/2024   Medicare Annual Wellness (AWV)  04/24/2025   DTaP/Tdap/Td (2 - Td or Tdap) 06/06/2026   Pneumonia Vaccine 58+ Years old  Completed   DEXA SCAN  Completed   Hepatitis C Screening  Completed   HPV VACCINES  Aged Out   Meningococcal B Vaccine  Aged Out    Health Maintenance  Health  Maintenance Due  Topic Date Due   Zoster Vaccines- Shingrix (1 of 2) Never done   MAMMOGRAM  06/14/2020   COVID-19 Vaccine (3 - 2024-25 season) 08/27/2023   Lung Cancer Screening  03/07/2024   Health Maintenance Items Addressed: Patient declines mammogram at this time; scheduled for lung ct scan on 09/03/24   Additional Screening:  Vision Screening: Recommended annual ophthalmology exams for early detection of glaucoma and other disorders of the eye.  Dental Screening: Recommended annual dental exams for proper oral hygiene  Community Resource Referral / Chronic Care Management: CRR required this visit?  No   CCM required this visit?  No     Plan:     I have personally reviewed and noted the following in the patient's chart:   Medical and social history Use of alcohol, tobacco or illicit drugs  Current medications and supplements including opioid prescriptions. Patient is not currently taking opioid prescriptions. Functional ability and status Nutritional status Physical activity Advanced directives List of other physicians Hospitalizations, surgeries, and ER visits in previous 12 months Vitals Screenings to include cognitive, depression, and falls Referrals and appointments  In addition, I have reviewed and discussed with patient certain preventive protocols, quality metrics, and best practice recommendations. A written personalized care plan for preventive services as well as general preventive health recommendations were provided to patient.     Seabron Cypress [place], [place]   [id number]   After Visit Summary: (MyChart) Due to this being a telephonic visit, the after visit summary with patients personalized plan was offered to patient via MyChart   Notes: Nothing significant to report at this time.

## 2024-04-30 ENCOUNTER — Encounter: Payer: Self-pay | Admitting: Family Medicine

## 2024-05-02 ENCOUNTER — Encounter: Payer: Self-pay | Admitting: Emergency Medicine

## 2024-05-03 ENCOUNTER — Telehealth: Payer: Self-pay | Admitting: Emergency Medicine

## 2024-05-03 NOTE — Telephone Encounter (Signed)
 Cmn received from [employer]  for Ipratropium and Arformoterol.

## 2024-05-08 ENCOUNTER — Emergency Department (HOSPITAL_BASED_OUTPATIENT_CLINIC_OR_DEPARTMENT_OTHER): Admitting: Radiology

## 2024-05-08 ENCOUNTER — Other Ambulatory Visit: Payer: Self-pay

## 2024-05-08 ENCOUNTER — Emergency Department (HOSPITAL_BASED_OUTPATIENT_CLINIC_OR_DEPARTMENT_OTHER)

## 2024-05-08 ENCOUNTER — Emergency Department (HOSPITAL_BASED_OUTPATIENT_CLINIC_OR_DEPARTMENT_OTHER)
Admission: EM | Admit: 2024-05-08 | Discharge: 2024-05-08 | Disposition: A | Discharge status: Home / Self Care | Attending: Emergency Medicine | Admitting: Emergency Medicine

## 2024-05-08 ENCOUNTER — Encounter (HOSPITAL_BASED_OUTPATIENT_CLINIC_OR_DEPARTMENT_OTHER): Payer: Self-pay | Admitting: Emergency Medicine

## 2024-05-08 DIAGNOSIS — M79661 Pain in right lower leg: Secondary | ICD-10-CM | POA: Diagnosis not present

## 2024-05-08 DIAGNOSIS — R531 Weakness: Secondary | ICD-10-CM | POA: Insufficient documentation

## 2024-05-08 DIAGNOSIS — M79604 Pain in right leg: Secondary | ICD-10-CM | POA: Diagnosis not present

## 2024-05-08 DIAGNOSIS — J449 Chronic obstructive pulmonary disease, unspecified: Secondary | ICD-10-CM | POA: Insufficient documentation

## 2024-05-08 DIAGNOSIS — R918 Other nonspecific abnormal finding of lung field: Secondary | ICD-10-CM | POA: Diagnosis not present

## 2024-05-08 DIAGNOSIS — Z7982 Long term (current) use of aspirin: Secondary | ICD-10-CM | POA: Diagnosis not present

## 2024-05-08 DIAGNOSIS — I2510 Atherosclerotic heart disease of native coronary artery without angina pectoris: Secondary | ICD-10-CM | POA: Insufficient documentation

## 2024-05-08 DIAGNOSIS — Z9981 Dependence on supplemental oxygen: Secondary | ICD-10-CM | POA: Insufficient documentation

## 2024-05-08 DIAGNOSIS — R600 Localized edema: Secondary | ICD-10-CM | POA: Diagnosis not present

## 2024-05-08 DIAGNOSIS — R0602 Shortness of breath: Secondary | ICD-10-CM | POA: Diagnosis not present

## 2024-05-08 LAB — CBC
HCT: 38.7 % (ref 36.0–46.0)
Hemoglobin: 12.1 g/dL (ref 12.0–15.0)
MCH: 28.2 pg (ref 26.0–34.0)
MCHC: 31.3 g/dL (ref 30.0–36.0)
MCV: 90.2 fL (ref 80.0–100.0)
Platelets: 272 10*3/uL (ref 150–400)
RBC: 4.29 MIL/uL (ref 3.87–5.11)
RDW: 15.7 % — ABNORMAL HIGH (ref 11.5–15.5)
WBC: 8.6 10*3/uL (ref 4.0–10.5)
nRBC: 0 % (ref 0.0–0.2)

## 2024-05-08 LAB — BASIC METABOLIC PANEL WITH GFR
Anion gap: 10 (ref 5–15)
BUN: 13 mg/dL (ref 8–23)
CO2: 29 mmol/L (ref 22–32)
Calcium: 9.7 mg/dL (ref 8.9–10.3)
Chloride: 102 mmol/L (ref 98–111)
Creatinine, Ser: 0.83 mg/dL (ref 0.44–1.00)
GFR, Estimated: >60 mL/min (ref >60)
Glucose, Bld: 102 mg/dL — ABNORMAL HIGH (ref 70–99)
Potassium: 4 mmol/L (ref 3.5–5.1)
Sodium: 141 mmol/L (ref 135–145)

## 2024-05-08 NOTE — Discharge Instructions (Addendum)
 You are seen today for right leg pain.  Believe this is likely radicular or musculoskeletal related. Recommend that you follow-up with PCP to for further evaluation or PT/spinal surgery referral. I have low suspicion for an emergent cause of your pain today with reassuring labs and imaging. Your chest Xray did show some interstitial infiltrates which I believe are likely due to your lung disease.  However if you begin to develop any worsening shortness of breath, fever, change in mucus color, recommend that you get evaluated by either PCP, pulmonology or return to the ED for emergent symptoms for evaluation.

## 2024-05-08 NOTE — ED Triage Notes (Signed)
 Right leg pain and swelling x2 weeks. COPD patient 2LPM via [place] baseline. Worsening SOB recently especially at night.

## 2024-05-08 NOTE — ED Notes (Signed)
 Patient transported to X-ray

## 2024-05-08 NOTE — ED Provider Notes (Signed)
  EMERGENCY DEPARTMENT AT [facility] Provider Note   CSN: [id number] Arrival date & time: 05/08/24  1503     History  Chief Complaint  Patient presents with   Leg Pain   Shortness of Breath    Taylor Burnett is a 73 y.o. female.   Leg Pain Shortness of Breath Patient is a 73 year old female presents ED today with her daughter with complaints of right lower leg pain and swelling that has been present x 2 weeks.  States that she has had increased pain when walking and has noticed "sharp shooting pain" going down the right leg.  Reports that she is still ambulatory but with pain. Past medical history of Hypoxemia, thrombocytopenia, neutropenia, COPD, emphysema, general disc disease, chronic respiratory failure on 2 L oxygen , CAD, paroxysmal atrial tachycardia, neuromuscular disorder, pulmonary nodules.   Notes that she has been short of breath however is not worse than normal, noted to have respiratory failure on 2 L of oxygen  at baseline which has been stable for her.  Denies trauma/falls, fever, saddle paresthesia, fecal/urinary incontinence, numbness, tingling. Denies headache, visual changes, chest pain, worsening shortness of breath, abdominal pain, nausea, vomiting, diarrhea, dysuria.     Home Medications Prior to Admission medications   Medication Sig Start Date End Date Taking? Authorizing Provider  albuterol  (VENTOLIN  HFA) 108 (90 Base) MCG/ACT inhaler INHALE 2 PUFFS INTO THE LUNGS EVERY 6 HOURS AS NEEDED. 07/11/23   Denson Flake, MD  albuterol  (VENTOLIN  HFA) 108 (90 Base) MCG/ACT inhaler INHALE 2 PUFFS INTO THE LUNGS EVERY 6 HOURS AS NEEDED. 02/21/24   Denson Flake, MD  arformoterol  (BROVANA ) 15 MCG/2ML NEBU Use 1 Vial In Nebulizer Twice Daily - Morning And Evening 02/29/24   Byrum, Robert S, MD  aspirin  EC 81 MG tablet Take 1 tablet (81 mg total) by mouth daily. Swallow whole. 12/16/20   Jacqueline Matsu, MD  BREZTRI  AEROSPHERE 160-9-4.8 MCG/ACT AERO  INHALE (2) PUFFS INTO THE LUNGS IN THE MORNING AND AT BEDTIME 11/07/23   Byrum, Robert S, MD  Cholecalciferol (VITAMIN D) 2000 units CAPS Take 2,000 Units by mouth daily.     [provider]  gabapentin  (NEURONTIN ) 100 MG capsule Take 1 capsule (100 mg total) by mouth 3 (three) times daily. Take with 300mg  to make 400 total. 09/18/23   Austine Lefort, MD  gabapentin  (NEURONTIN ) 300 MG capsule Take 1 capsule (300 mg total) by mouth 3 (three) times daily. 05/24/23   Austine Lefort, MD  gabapentin  (NEURONTIN ) 300 MG capsule Take 1 capsule (300 mg total) by mouth 3 (three) times daily. Take with 100mg  to make 400 total. 08/10/23   Austine Lefort, MD  gabapentin  (NEURONTIN ) 300 MG capsule TAKE ONE CAPSULE BY MOUTH 3 TIMES A DAY WITH 100MG  TABLET TO MAKE 400MG  TOTAL DAILY 02/01/24   Austine Lefort, MD  ipratropium (ATROVENT ) 0.02 % nebulizer solution USE 1 VIAL IN NEBULIZER 3 TIMES DAILY 02/29/24   Denson Flake, MD  naproxen sodium (ALEVE) 220 MG tablet Take 440 mg by mouth 2 (two) times daily as needed (pain).     [provider]  OXYGEN  Inhale 2 L into the lungs as needed. FOR USE WITH EXERTION    [provider]      Allergies    Prednisone  and Toprol  xl [metoprolol ]    Review of Systems   Review of Systems  Respiratory:  Positive for shortness of breath.   Cardiovascular:  Positive for leg  swelling.  Musculoskeletal:  Positive for myalgias.  All other systems reviewed and are negative.   Physical Exam Updated Vital Signs BP (!) 142/91 (BP Location: Right Arm)   Pulse 90   Temp 97.8 F (36.6 C)   Resp 18   SpO2 99%  Physical Exam Vitals and nursing note reviewed.  Constitutional:      General: She is not in acute distress.    Appearance: Normal appearance. She is not ill-appearing.  HENT:     Head: Normocephalic and atraumatic.  Eyes:     Extraocular Movements: Extraocular movements intact.     Conjunctiva/sclera: Conjunctivae normal.   Cardiovascular:     Rate and Rhythm: Normal rate and regular rhythm.     Pulses: Normal pulses.     Heart sounds: Normal heart sounds. No murmur heard.    No friction rub. No gallop.  Pulmonary:     Effort: Pulmonary effort is normal. No tachypnea, accessory muscle usage or respiratory distress.     Breath sounds: Decreased breath sounds present. No wheezing, rhonchi or rales.  Chest:     Chest wall: No tenderness.  Abdominal:     General: Abdomen is flat.     Palpations: Abdomen is soft.     Tenderness: There is no abdominal tenderness.  Musculoskeletal:     Right lower leg: No tenderness. Edema present.     Left lower leg: No tenderness. Edema present.  Skin:    General: Skin is warm and dry.     Coloration: Skin is not cyanotic.     Findings: No ecchymosis or erythema.  Neurological:     General: No focal deficit present.     Mental Status: She is alert and oriented to person, place, and time. Mental status is at baseline.     Cranial Nerves: No cranial nerve deficit.     Comments: Patient noted to have weakness when evaluating hip flexion in both legs, limited by pain in right hip when testing strength.  Sensation otherwise intact, dorsiflexion & plantarflexion intact, no ataxia, no apraxia, no aphasia, no nystagmus, bilateral grip strength, normal coordination.  Psychiatric:        Mood and Affect: Mood normal.     ED Results / Procedures / Treatments   Labs (all labs ordered are listed, but only abnormal results are displayed) Labs Reviewed  BASIC METABOLIC PANEL WITH GFR - Abnormal; Notable for the following components:      Result Value   Glucose, Bld 102 (*)    All other components within normal limits  CBC - Abnormal; Notable for the following components:   RDW 15.7 (*)    All other components within normal limits    EKG EKG Interpretation Date/Time:  Wednesday May 08 2024 15:21:54 EDT Ventricular Rate:  93 PR Interval:  180 QRS Duration:  72 QT  Interval:  348 QTC Calculation: 432 R Axis:   73  Text Interpretation: Sinus rhythm with Premature atrial complexes Nonspecific T wave abnormality Confirmed by Guadalupe Lee ([id number]) on 05/08/2024 3:51:33 PM  Radiology US  Venous Img Lower Unilateral Right Result Date: 05/08/2024 CLINICAL DATA:  Increasing right lower leg pain and edema EXAM: RIGHT LOWER EXTREMITY VENOUS DOPPLER ULTRASOUND TECHNIQUE: Gray-scale sonography with graded compression, as well as color Doppler and duplex ultrasound were performed to evaluate the lower extremity deep venous systems from the level of the common femoral vein and including the common femoral, femoral, profunda femoral, popliteal and calf veins including the posterior tibial, peroneal  and gastrocnemius veins when visible. Spectral Doppler was utilized to evaluate flow at rest and with distal augmentation maneuvers in the common femoral, femoral and popliteal veins. COMPARISON:  None Available. FINDINGS: Contralateral Common Femoral Vein: Respiratory phasicity is normal and symmetric with the symptomatic side. No evidence of thrombus. Normal compressibility. Common Femoral Vein: No evidence of thrombus. Normal compressibility, respiratory phasicity and response to augmentation. Saphenofemoral Junction: No evidence of thrombus. Normal compressibility and flow on color Doppler imaging. Profunda Femoral Vein: No evidence of thrombus. Normal compressibility and flow on color Doppler imaging. Femoral Vein: No evidence of thrombus. Normal compressibility, respiratory phasicity and response to augmentation. Popliteal Vein: No evidence of thrombus. Normal compressibility, respiratory phasicity and response to augmentation. Calf Veins: No evidence of thrombus. Normal compressibility and flow on color Doppler imaging. Superficial Great Saphenous Vein: No evidence of thrombus. Normal compressibility. IMPRESSION: No evidence of deep venous thrombosis. Electronically Signed   By: Melven Stable.   Shick M.D.   On: 05/08/2024 16:40   DG Hip Unilat W or Wo Pelvis 2-3 Views Right Result Date: 05/08/2024 CLINICAL DATA:  Leg pain EXAM: DG HIP (WITH OR WITHOUT PELVIS) 2-3V RIGHT COMPARISON:  None Available. FINDINGS: There is no evidence of hip fracture or dislocation. There is no evidence of arthropathy or other focal bone abnormality. IMPRESSION: Negative. Electronically Signed   By: Fredrich Jefferson M.D.   On: 05/08/2024 16:17   DG Chest 2 View Result Date: 05/08/2024 CLINICAL DATA:  Shortness of breath EXAM: CHEST - 2 VIEW COMPARISON:  August 25, 2021 FINDINGS: Bilateral reticular interstitial infiltrates correlate with edema versus pneumonitis without consolidations Heart normal size No pleural effusions COPD changes hyperinflation both lung bases Calcified bilateral breast implants IMPRESSION: Interstitial infiltrates as described Electronically Signed   By: Fredrich Jefferson M.D.   On: 05/08/2024 15:36    Procedures Procedures   Medications Ordered in ED Medications - No data to display  ED Course/ Medical Decision Making/ A&P                                Medical Decision Making Amount and/or Complexity of Data Reviewed Labs: ordered. Radiology: ordered.   This patient is a 73 year old female who presents to the ED for concern of right lower leg pain x 2 weeks accompanied with increased swelling, worse with ambulation.  Noted to be described as "sharp and shooting" pain.  No red flag symptoms.  No previous history of DVT.  On physical exam, patient is in no acute distress, afebrile, alert and orient x 4, speaking in full sentences, nontachypneic, nontachycardic.  On 2 L of oxygen , saturations stable.  Lung sounds slightly diminished bilaterally, no murmur noted.  No abdominal tenderness to palpation, no CVA tenderness, no lumbar tenderness to palpation.  Mild edema noted to the right leg.  Celine Collard' sign negative bilaterally.  Patient had intact sensation and gross motor ability  bilaterally.  Exam otherwise unremarkable.  DVT rule out and right hip x-ray are both unremarkable for any acute findings.  Chest x-ray did note some interstitial infiltrates.  Believe this is likely secondary to her pulmonary lung disease, for which she is being monitored regularly by pulmonology.  Scheduled for a repeat CT in September.  She is not exhibiting any worsening cough, fever, worsening shortness of breath today.  Leg pain likely due to arthritis or colopathy, not requiring admission or further emergency workup at this time.  Will have her  continue to do symptomatic management at home as well as follow-up with PCP as well as possible Ortho for further evaluation and PT referral/pain management.  Patient vital signs have remained stable throughout the course of patient's time in the ED. Low suspicion for any other emergent pathology at this time. I believe this patient is safe to be discharged. Provided strict return to ER precautions. Patient expressed agreement and understanding of plan. All questions were answered.  Differential diagnoses prior to evaluation: The emergent differential diagnosis includes, but is not limited to, PAD, PVD, DVT, arthritis, spinal stenosis, cauda equina. This is not an exhaustive differential.   Past Medical History / Co-morbidities / Social History: Hypoxemia, thrombocytopenia, neutropenia, COPD, emphysema, general disc disease, chronic respiratory failure, CAD, paroxysmal atrial tachycardia, neuromuscular disorder, pulmonary nodules   Additional history: Chart reviewed. 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  Noted to be on chronic treatment with use of oxygen  from hypoxemic respiratory failure.  Noting solid lateral right middle lobe nodule questioning adenocarcinoma.    Lab Tests/Imaging studies: I personally interpreted labs/imaging and the  pertinent results include:   CBC unremarkable BMP unremarkable Ultrasound of right leg negative for DVT X-ray right hip unremarkable Chest x-ray did show some interstitial filtrates  I agree with the radiologist interpretation.  Cardiac monitoring: EKG obtained and interpreted by myself and attending physician which shows: Sinus rhythm with PACs   Medications: No medications were ordered at this time.  I have reviewed the patients home medicines and have made adjustments as needed.  Social Determinants of Health: Patient noted to have good follow-up with primary care.  Disposition: After consideration of the diagnostic results and the patients response to treatment, I feel that the patient would benefit from discharge and treatment as above.   emergency department workup does not suggest an emergent condition requiring admission or immediate intervention beyond what has been performed at this time. The plan is: Follow-up with PCP for possible PT referral, spinal referral, follow-up with pulmonology for lung findings, return to the ED for any new or worsening symptoms. The patient is safe for discharge and has been instructed to return immediately for worsening symptoms, change in symptoms or any other concerns.   Final Clinical Impression(s) / ED Diagnoses Final diagnoses:  Right leg pain    Rx / DC Orders ED Discharge Orders     None         Camrie Stock S, PA-C 05/08/24 1736    Guadalupe Lee, MD 05/14/24 1454

## 2024-05-09 NOTE — Telephone Encounter (Signed)
 White out was on the form when received from provider bin. I will contact [employer] since form can't be used.

## 2024-05-09 NOTE — Telephone Encounter (Signed)
 Cmn signed

## 2024-05-09 NOTE — Telephone Encounter (Signed)
 Got CMN with Dr. Lacinda Pica name written over white out. We need a CMN with his name, no white out allowed. New order needs to be sent in.

## 2024-05-17 ENCOUNTER — Ambulatory Visit (INDEPENDENT_AMBULATORY_CARE_PROVIDER_SITE_OTHER): Admitting: Family Medicine

## 2024-05-17 ENCOUNTER — Encounter: Payer: Self-pay | Admitting: Family Medicine

## 2024-05-17 ENCOUNTER — Telehealth: Payer: Self-pay

## 2024-05-17 ENCOUNTER — Other Ambulatory Visit: Payer: Self-pay | Admitting: Family Medicine

## 2024-05-17 VITALS — BP 118/64 | HR 85 | Ht 63.0 in | Wt 148.0 lb

## 2024-05-17 DIAGNOSIS — M5416 Radiculopathy, lumbar region: Secondary | ICD-10-CM

## 2024-05-17 DIAGNOSIS — M51372 Other intervertebral disc degeneration, lumbosacral region with discogenic back pain and lower extremity pain: Secondary | ICD-10-CM | POA: Diagnosis not present

## 2024-05-17 MED ORDER — IBUPROFEN 800 MG PO TABS: 800.0000 mg | ORAL_TABLET | Freq: Three times a day (TID) | ORAL | 0 refills | Status: DC | PRN

## 2024-05-17 MED ORDER — OXYCODONE-ACETAMINOPHEN 5-325 MG PO TABS
1.0000 | ORAL_TABLET | ORAL | 0 refills | Status: AC | PRN
Start: 2024-05-17 — End: 2024-05-24

## 2024-05-17 MED ORDER — IBUPROFEN 200 MG PO TABS: 800.0000 mg | ORAL_TABLET | Freq: Three times a day (TID) | ORAL | 0 refills | Status: DC | PRN

## 2024-05-17 NOTE — Telephone Encounter (Signed)
 Copied from CRM [id number]. Topic: Clinical - Prescription Issue >> May 17, 2024  4:35 PM Bridgette Campus T wrote: Reason for CRM: Ace Holder with [facility] regarding ibuprofen (ADVIL) 200 MG tablet- patient was expecting 800 mg tablets and is very upset that they are 200 mg, if doctor agrees to the 800 mg tablet, they will need a new prescription

## 2024-05-17 NOTE — Progress Notes (Signed)
 Subjective:    Patient ID: Taylor Burnett, female    DOB: September 13, 1951, 73 y.o.   MRN: [id number]  Patient has bilateral neuropathy in both feet.  She is taking gabapentin  300 mg 3 times a day and this helps.  Over 6 weeks ago, the patient developed sudden pain in her right posterior hip.  The pain radiates down her right leg through her right thigh into her right calf down into her right foot.  It is a deep aching severe pain that is intense and miserable.  It hurts to stand and walk.  She has known degenerative disc disease on x-rays of the lumbar spine.  She had mild spinal stenosis in 2016.  She been trying Aleve, Advil, Tylenol  about the relief.  Pain was so severe that she went to the emergency room recently.  Ultrasound ruled out DVT.  X-ray without any pathology of the right hip.  The patient has palpable dorsalis pedis and posterior tibialis pulses in today testing against any peripheral artery disease. Past Medical History:  Diagnosis Date   Arthritis    CAD (coronary artery disease), native coronary artery    mild non obstructive CAD of the RCA, LCX, LAD up to 25-49% by coronary CTA 11/2020   Cataract    COPD (chronic obstructive pulmonary disease) (HCC)    Cough    DDD (degenerative disc disease), cervical    Dysrhythmia    RAPID HEART BEAT OCC    Emphysema of lung (HCC)    GERD (gastroesophageal reflux disease)    OCC   H/O degenerative disc disease    Headache    Myocardial infarction (HCC) 2016   Neuromuscular disorder (HCC)    Osteoporosis    Oxygen  deficiency    Panic attacks    PAT (paroxysmal atrial tachycardia) (HCC)    noted on event monitor 11/2020   Pre-diabetes    Severe lung disease    Shortness of breath dyspnea    Sleep apnea    Spondylosis    Past Surgical History:  Procedure Laterality Date   ANTERIOR CERVICAL DECOMP/DISCECTOMY FUSION N/A 03/13/2015   Procedure: CERVICAL THREE-CERVICAL FOUR, CERVICAL FOUR-CERVICAL FIVE, CERVICAL FIVE-CERVICAL SIX  ANTERIOR CERVICAL DECOMPRESSION/DISCECTOMY FUSION 3 LEVELS;  Surgeon: Isadora Mar, MD;  Location: [facility];  Service: Neurosurgery;  Laterality: N/A;   BIOPSY  03/25/2021   Procedure: BIOPSY;  Surgeon: Janel Medford, MD;  Location: [facility];  Service: Endoscopy;;   BREAST ENHANCEMENT SURGERY  1990   CARDIAC CATHETERIZATION  1995   CATARACT EXTRACTION W/PHACO Right 05/02/2017   Procedure: CATARACT EXTRACTION PHACO AND INTRAOCULAR LENS PLACEMENT (IOC);  Surgeon: Albert Huff, MD;  Location: [facility];  Service: Ophthalmology;  Laterality: Right;  CDE: 6.22   CATARACT EXTRACTION W/PHACO Left 05/16/2017   Procedure: CATARACT EXTRACTION PHACO AND INTRAOCULAR LENS PLACEMENT (IOC);  Surgeon: Albert Huff, MD;  Location: [facility];  Service: Ophthalmology;  Laterality: Left;  CDE: 4.10   ESOPHAGOGASTRODUODENOSCOPY (EGD) WITH PROPOFOL  N/A 03/25/2021   Procedure: ESOPHAGOGASTRODUODENOSCOPY (EGD) WITH PROPOFOL ;  Surgeon: Janel Medford, MD;  Location: [facility];  Service: Endoscopy;  Laterality: N/A;   EYE SURGERY     SPINE SURGERY     TONSILLECTOMY     TUBAL LIGATION     VIDEO BRONCHOSCOPY Bilateral 12/31/2018   Procedure: VIDEO BRONCHOSCOPY WITHOUT FLUORO;  Surgeon: Denson Flake, MD;  Location: [facility];  Service: Cardiopulmonary;  Laterality: Bilateral;   Current Outpatient Medications on File Prior to Visit  Medication Sig Dispense Refill   albuterol  (VENTOLIN  HFA) 108 (90 Base) MCG/ACT inhaler INHALE 2 PUFFS INTO THE LUNGS EVERY 6 HOURS AS NEEDED. 8.5 g 0   albuterol  (VENTOLIN  HFA) 108 (90 Base) MCG/ACT inhaler INHALE 2 PUFFS INTO THE LUNGS EVERY 6 HOURS AS NEEDED. 36 g 2   arformoterol  (BROVANA ) 15 MCG/2ML NEBU Use 1 Vial In Nebulizer Twice Daily - Morning And Evening 120 mL 5   aspirin  EC 81 MG tablet Take 1 tablet (81 mg total) by mouth daily. Swallow whole. 90 tablet 3   BREZTRI  AEROSPHERE 160-9-4.8 MCG/ACT AERO INHALE (2) PUFFS INTO THE LUNGS IN THE MORNING AND AT BEDTIME  10.7 g 11   Cholecalciferol (VITAMIN D) 2000 units CAPS Take 2,000 Units by mouth daily.      gabapentin  (NEURONTIN ) 100 MG capsule Take 1 capsule (100 mg total) by mouth 3 (three) times daily. Take with 300mg  to make 400 total. 90 capsule 11   gabapentin  (NEURONTIN ) 300 MG capsule Take 1 capsule (300 mg total) by mouth 3 (three) times daily. 90 capsule 3   gabapentin  (NEURONTIN ) 300 MG capsule Take 1 capsule (300 mg total) by mouth 3 (three) times daily. Take with 100mg  to make 400 total. 90 capsule 3   gabapentin  (NEURONTIN ) 300 MG capsule TAKE ONE CAPSULE BY MOUTH 3 TIMES A DAY WITH 100MG  TABLET TO MAKE 400MG  TOTAL DAILY 90 capsule 3   ipratropium (ATROVENT ) 0.02 % nebulizer solution USE 1 VIAL IN NEBULIZER 3 TIMES DAILY 120 mL 5   naproxen sodium (ALEVE) 220 MG tablet Take 440 mg by mouth 2 (two) times daily as needed (pain).      OXYGEN  Inhale 2 L into the lungs as needed. FOR USE WITH EXERTION     Current Facility-Administered Medications on File Prior to Visit  Medication Dose Route Frequency Provider Last Rate Last Admin   denosumab  (PROLIA ) injection 60 mg  60 mg Subcutaneous Q6 months Austine Lefort, MD   60 mg at 02/13/19 1553   Allergies  Allergen Reactions   Prednisone  Other (See Comments)    Depression and suicidal   Toprol  Xl [Metoprolol ] Other (See Comments)    Fatigue   Social History   Socioeconomic History   Marital status: Divorced    Spouse name: Not on file   Number of children: 3   Years of education: Not on file   Highest education level: 11th grade  Occupational History   Occupation: on disability.  Tobacco Use   Smoking status: Former    Current packs/day: 0.00    Average packs/day: 2.0 packs/day for 40.0 years (80.0 ttl pk-yrs)    Types: Cigarettes    Start date: 07/31/1973    Quit date: 07/31/2013    Years since quitting: 10.8   Smokeless tobacco: Never   Tobacco comments:    Would like Lung Ca Screening, quit in 2014. 10/29/2021  Vaping Use    Vaping status: Never Used  Substance and Sexual Activity   Alcohol use: No    Comment: OCC WINE    Drug use: No   Sexual activity: Never  Other Topics Concern   Not on file  Social History Narrative   Divorced and lives alone.    2 sons and 1 daughter   6 grandchildren.    Social Drivers of Health   Financial Resource Strain: Medium Risk (04/24/2024)   Overall Financial Resource Strain (CARDIA)    Difficulty of Paying Living Expenses: Somewhat hard  Food Insecurity: Food  Insecurity Present (04/24/2024)   Hunger Vital Sign    Worried About Running Out of Food in the Last Year: Sometimes true    Ran Out of Food in the Last Year: Never true  Transportation Needs: No Transportation Needs (04/24/2024)   PRAPARE - Administrator, Civil Service (Medical): No    Lack of Transportation (Non-Medical): No  Physical Activity: Inactive (04/24/2024)   Exercise Vital Sign    Days of Exercise per Week: 0 days    Minutes of Exercise per Session: 0 min  Stress: No Stress Concern Present (04/24/2024)   [employer] of Occupational Health - Occupational Stress Questionnaire    Feeling of Stress : Not at all  Social Connections: Unknown (04/24/2024)   Social Connection and Isolation Panel [NHANES]    Frequency of Communication with Friends and Family: More than three times a week    Frequency of Social Gatherings with Friends and Family: Twice a week    Attends Religious Services: Patient declined    Database administrator or Organizations: No    Attends Banker Meetings: Never    Marital Status: Divorced  Catering manager Violence: Not At Risk (04/24/2024)   Humiliation, Afraid, Rape, and Kick questionnaire    Fear of Current or Ex-Partner: No    Emotionally Abused: No    Physically Abused: No    Sexually Abused: No      Review of Systems     Objective:   Physical Exam Vitals reviewed.  Constitutional:      General: She is not in acute distress.     Appearance: She is not diaphoretic.  HENT:     Head: Normocephalic and atraumatic.     Nose: Nose normal.  Cardiovascular:     Rate and Rhythm: Normal rate. Rhythm irregular.     Heart sounds: Normal heart sounds. No murmur heard.    No friction rub. No gallop.  Pulmonary:     Effort: Pulmonary effort is normal.     Breath sounds: Examination of the right-lower field reveals rales. Examination of the left-lower field reveals rales. Rales present. No wheezing or rhonchi.  Musculoskeletal:     Cervical back: Neck supple.     Lumbar back: Tenderness present. Decreased range of motion.     Right foot: Normal range of motion. No swelling, deformity, tenderness or bony tenderness. Normal pulse.     Left foot: Normal range of motion. No swelling, deformity, tenderness or bony tenderness. Normal pulse.       Legs:    Patient's muscle strength is 4/5 equal and symmetric in both legs when checked with knee extension, knee flexion, ankle flexion and extension.  She has normal sensation.  Reflexes are 2/4 equal and symmetric bilaterally at the patella.  Visibly there are no skin abnormalities     Assessment & Plan:  Degeneration of intervertebral disc of lumbosacral region with discogenic back pain and lower extremity pain - Plan: MR Lumbar Spine Wo Contrast  Lumbar radiculopathy - Plan: MR Lumbar Spine Wo Contrast Patient symptoms suggest lumbar colopathy from a pinched nerve in the lumbar spine.  She has known degenerative disc disease and mild spinal stenosis on imaging over 10 years ago.  Recommend repeating MRI given the fact she has failed conservative therapy over the last 6 weeks with numerous NSAIDs and Tylenol .  Patient is unable to take prednisone  due to severe allergy.  Therefore I will have her take 800 mg of ibuprofen 3  times a day and use Percocet 5/325 1 p.o. every 8 hours as needed pain for breakthrough pain.  If the MRI shows nerve compression will refer the patient for an epidural  steroid injection

## 2024-05-29 ENCOUNTER — Ambulatory Visit
Admission: RE | Admit: 2024-05-29 | Discharge: 2024-05-29 | Disposition: A | Discharge status: Home / Self Care | Source: Ambulatory Visit | Attending: Family Medicine | Admitting: Family Medicine

## 2024-05-29 DIAGNOSIS — M4316 Spondylolisthesis, lumbar region: Secondary | ICD-10-CM | POA: Diagnosis not present

## 2024-05-29 DIAGNOSIS — M47816 Spondylosis without myelopathy or radiculopathy, lumbar region: Secondary | ICD-10-CM | POA: Diagnosis not present

## 2024-05-29 DIAGNOSIS — M5416 Radiculopathy, lumbar region: Secondary | ICD-10-CM

## 2024-05-29 DIAGNOSIS — M51372 Other intervertebral disc degeneration, lumbosacral region with discogenic back pain and lower extremity pain: Secondary | ICD-10-CM

## 2024-06-03 ENCOUNTER — Telehealth: Payer: Self-pay | Admitting: Emergency Medicine

## 2024-06-03 NOTE — Telephone Encounter (Signed)
"  Hypertropium" is not a medication  I think she might have meant ipratropium, but need to check with her  I called the pt and there was no answer- LMTCB

## 2024-06-03 NOTE — Telephone Encounter (Signed)
 Copied from CRM [id number]. Topic: Clinical - Medication Refill >> Jun 03, 2024 11:49 AM Hilton Lucky wrote: Medication: Hypertropium - states is a nebulizer medication  Has the patient contacted their pharmacy? Yes - states has sent several faxes with no response.  This is the patient's preferred pharmacy:  [facility] - [place], [place] - [address]. [address] [employer]. [address] [place] [address] Phone: [phone] Fax: [phone]  Is this the correct pharmacy for this prescription? Yes If no, delete pharmacy and type the correct one.   Has the prescription been filled recently? No  Is the patient out of the medication? Yes  Has the patient been seen for an appointment in the last year OR does the patient have an upcoming appointment? Yes  Can we respond through MyChart? Yes  Agent: Please be advised that Rx refills may take up to 3 business days. We ask that you follow-up with your pharmacy.

## 2024-06-03 NOTE — Telephone Encounter (Signed)
 Pt called back, and yes, it is  ipratropium.  Pt states [facility] claims they do not have the Rx. Called [facility], and the medication was shipped out today, per Marquita Situ, the pharmacist. Advised pt to call back if she does not receive.  She said it usually gets to her within 3 days. Nothing further needed at this time.

## 2024-06-04 ENCOUNTER — Encounter: Payer: Self-pay | Admitting: Family Medicine

## 2024-06-13 ENCOUNTER — Other Ambulatory Visit: Payer: Self-pay

## 2024-06-13 ENCOUNTER — Ambulatory Visit: Payer: Self-pay | Admitting: Family Medicine

## 2024-06-13 DIAGNOSIS — M4316 Spondylolisthesis, lumbar region: Secondary | ICD-10-CM

## 2024-07-23 ENCOUNTER — Telehealth: Payer: Self-pay | Admitting: *Deleted

## 2024-07-23 NOTE — Telephone Encounter (Signed)
 CMN received from [facility]  regarding portable oxygen  concentrator.  Placed in sign folder for signature.

## 2024-07-31 ENCOUNTER — Other Ambulatory Visit: Payer: Self-pay | Admitting: Emergency Medicine

## 2024-07-31 DIAGNOSIS — R053 Chronic cough: Secondary | ICD-10-CM

## 2024-07-31 DIAGNOSIS — J438 Other emphysema: Secondary | ICD-10-CM

## 2024-08-07 ENCOUNTER — Telehealth: Payer: Self-pay | Admitting: *Deleted

## 2024-08-07 NOTE — Telephone Encounter (Signed)
 Duplicate CMN received by [facility] regarding oxygen .  Faxed again.  Nothing further needed.

## 2024-08-07 NOTE — Telephone Encounter (Signed)
 CMN signed and faxed to [phone].  Received a confirmation fax that it was sent successfully.  Placed in scan folder.  Nothing further needed.

## 2024-09-03 ENCOUNTER — Ambulatory Visit
Admission: RE | Admit: 2024-09-03 | Discharge: 2024-09-03 | Disposition: A | Discharge status: Home / Self Care | Source: Ambulatory Visit | Attending: Emergency Medicine | Admitting: Emergency Medicine

## 2024-09-03 DIAGNOSIS — R918 Other nonspecific abnormal finding of lung field: Secondary | ICD-10-CM | POA: Diagnosis not present

## 2024-09-06 ENCOUNTER — Encounter: Payer: Self-pay | Admitting: Emergency Medicine

## 2024-09-06 NOTE — Telephone Encounter (Signed)
**Note De-identified  [place] Obfuscation** Please advise 

## 2024-09-28 ENCOUNTER — Other Ambulatory Visit: Payer: Self-pay | Admitting: Family Medicine

## 2024-10-03 ENCOUNTER — Ambulatory Visit: Admitting: Emergency Medicine

## 2024-10-09 ENCOUNTER — Ambulatory Visit: Admitting: Emergency Medicine

## 2024-10-09 ENCOUNTER — Ambulatory Visit: Payer: Self-pay

## 2024-10-09 DIAGNOSIS — R918 Other nonspecific abnormal finding of lung field: Secondary | ICD-10-CM

## 2024-10-09 NOTE — Telephone Encounter (Signed)
 FYI Only or Action Required?: Action required by provider: update on patient condition.  Patient is followed in Pulmonology for COPD, last seen on 03/21/2024 by Taylor Lamar RAMAN, MD.  Called Nurse Triage reporting Insomnia and Shortness of Breath.  Symptoms began several months ago.  Interventions attempted: Rescue inhaler, Maintenance inhaler, Nebulizer treatments, and Home oxygen  use.  Symptoms are: unchanged.  Triage Disposition: See PCP Within 2 Weeks  Patient/caregiver understands and will follow disposition?: Yes  Copied from CRM #[id number]. Topic: Clinical - Red Word Triage >> Oct 09, 2024 11:59 AM Devaughn Burnett wrote: Red Word that prompted transfer to Nurse Triage: cough, insomnia and shortness of breath Reason for Disposition  [1] MILD longstanding difficulty breathing (e.g., minimal/no SOB at rest, SOB with walking, pulse < 100) AND [2] SAME as normal  Answer Assessment - Initial Assessment Questions 1. RESPIRATORY STATUS: Describe your breathing? (e.g., wheezing, shortness of breath, unable to speak, severe coughing)      Short of breath, nagging cough 2. ONSET: When did this breathing problem begin?      1-2 months 3. PATTERN Does the difficult breathing come and go, or has it been constant since it started?      Comes and goes during the day 4. SEVERITY: How bad is your breathing? (e.g., mild, moderate, severe)      Sometimes patient loses her breath during coughing 5. RECURRENT SYMPTOM: Have you had difficulty breathing before? If Yes, ask: When was the last time? and What happened that time?      Yes,  6. CARDIAC HISTORY: Do you have any history of heart disease? (e.g., heart attack, angina, bypass surgery, angioplasty)     denies 7. LUNG HISTORY: Do you have any history of lung disease?  (e.g., pulmonary embolus, asthma, emphysema)     COPD 8. CAUSE: What do you think is causing the breathing problem?      cough 9. OTHER SYMPTOMS: Do you have any  other symptoms? (e.g., chest pain, cough, dizziness, fever, runny nose)     Cough, non productive 10. O2 SATURATION MONITOR:  Do you use an oxygen  saturation monitor (pulse oximeter) at home? If Yes, ask: What is your reading (oxygen  level) today? What is your usual oxygen  saturation reading? (e.g., 95%)       Does not monitor 11. PREGNANCY: Is there any chance you are pregnant? When was your last menstrual period?       N/a 12. TRAVEL: Have you traveled out of the country in the last month? (e.g., travel history, exposures)       Denies   Using home oxygen  at 2L continuously  Answer Assessment - Initial Assessment Questions 1. DESCRIPTION: Tell me about your sleeping problem. (e.g., waking frequently during night, sleeping during day and awake at night, trouble falling asleep) How bad is it?      Waking up frequently during the night if she is able to go to sleep at all 2. ONSET: How long have you been having trouble sleeping? (e.g., days, weeks, months; longstanding sleep problems)     Worsening over the past couple of months, but has been happening off and on for years 3. DAYTIME SLEEP PATTERN: How much time do you spend sleeping or napping during the day?     Denies daytime napping 4. STRESSORS: Is there anything that is making you feel stressed? Is there something that worries you?     denies 5. PAIN: Do you have any pain that is keeping you awake? (  e.g., back pain, joint pain) If Yes, ask How bad is the pain? (e.g., scale 0-10; mild, moderate, severe).     Peripheral neuropathy 6. CAFFEINE: Do you drink caffeinated beverages? If Yes, ask How much each day? (e.g., coffee, tea, colas)     Only a cup of coffee in the morning 7. ALCOHOL USE OR SUBSTANCE USE: Do you drink alcohol or use any substances?     denies 8. MEDICINE CHANGE: Has there been any recent change in medicines? (e.g., new medicine started, stopped, or dose changed).      denies 9.  TREATMENT: What have you done so far to treat this sleep problem? (e.g., prescription or OTC sleep medicines, herbal or dietary supplements, cannabis, relaxation strategies)     Has had sleep studies.  Prescribed cpap, but not paid for by medicare 10. OTHER SYMPTOMS: Do you have any other symptoms?  (e.g., difficulty breathing)       Patient has history of COPD and chronic respiratory failre 11. PREGNANCY: Is there any chance you are pregnant? When was your last menstrual period?       N/a  Protocols used: Insomnia-A-AH, Breathing Difficulty-A-AH

## 2024-10-09 NOTE — Telephone Encounter (Signed)
 Called and spoke with the pt  She is scheduled to see RB 10/21/24  I offered sooner appt for tomorrow and she refused She wishes to keep the appt she has and will call sooner or seek emergency care sooer if needed  Nothing further needed

## 2024-10-17 ENCOUNTER — Ambulatory Visit: Admitting: Family Medicine

## 2024-10-23 ENCOUNTER — Ambulatory Visit (INDEPENDENT_AMBULATORY_CARE_PROVIDER_SITE_OTHER): Admitting: Emergency Medicine

## 2024-10-23 ENCOUNTER — Encounter: Payer: Self-pay | Admitting: Emergency Medicine

## 2024-10-23 VITALS — BP 130/78 | HR 108 | Ht 62.0 in | Wt 146.8 lb

## 2024-10-23 DIAGNOSIS — J438 Other emphysema: Secondary | ICD-10-CM | POA: Diagnosis not present

## 2024-10-23 DIAGNOSIS — A318 Other mycobacterial infections: Secondary | ICD-10-CM

## 2024-10-23 DIAGNOSIS — R918 Other nonspecific abnormal finding of lung field: Secondary | ICD-10-CM

## 2024-10-23 DIAGNOSIS — J9611 Chronic respiratory failure with hypoxia: Secondary | ICD-10-CM

## 2024-10-23 MED ORDER — DOXYCYCLINE HYCLATE 100 MG PO TABS: 100.0000 mg | ORAL_TABLET | Freq: Two times a day (BID) | ORAL | 0 refills | Status: AC

## 2024-10-23 MED ORDER — BREZTRI AEROSPHERE 160-9-4.8 MCG/ACT IN AERO: INHALATION_SPRAY | RESPIRATORY_TRACT | 11 refills | Status: AC

## 2024-10-23 MED ORDER — AZITHROMYCIN 250 MG PO TABS: 250.0000 mg | ORAL_TABLET | ORAL | 6 refills | Status: AC

## 2024-10-23 NOTE — Progress Notes (Signed)
   Subjective:    Patient ID: Taylor Burnett, female    DOB: 1951/11/03, 73 y.o.   MRN: [id number]  Shortness of Breath    Acute office visit 10/23/2024 --Taylor Burnett is 42 with very severe COPD, chronic hypoxemic respiratory failure, history of Mycobacterium gordonii colonization with associated pulmonary nodule disease.  She is here today reporting that she has been having more cough, more mucous with some greenish color. Her breathing is worse, ? A bit more sub-acute. She is using Breztri  bid and also Brovana  / ipratropium in the am. Her O2 is at 2L/min.    CT chest 09/03/2024 reviewed by me shows lateral right middle lobe nodule 14 x 8 mm with a 4 mm solid component that is stable compared with 02/2023 but slightly larger than 07/2022 and larger than more remote scans from 2015.  New tree-in-bud opacities in the superior segment of the right lower lobe, scattered chronic scarring at both bases.   Review of Systems  Respiratory:  Positive for shortness of breath.    As per HPI     Objective:   Physical Exam Vitals:   10/23/24 1319  BP: 130/78  Pulse: (!) 108  SpO2: 93%  Weight: 146 lb 12.8 oz (66.6 kg)  Height: 5' 2 (1.575 m)    Gen: Pleasant, well-nourished, in no distress,  normal affect  ENT: No lesions,  mouth clear,  oropharynx clear, no postnasal drip  Neck: No JVD, no stridor  Lungs: No use of accessory muscles, very distant, clear, no wheeze on normal breath.   Cardiovascular: RRR, heart sounds normal, no murmur or gallops, no peripheral edema  Musculoskeletal: No deformities, no cyanosis or clubbing  Neuro: alert, non focal  Skin: Warm, no lesions or rashes      Assessment & Plan:  COPD (chronic obstructive pulmonary disease) with emphysema (HCC) Persistent bronchitic symptoms, worse over about 1 month.  She also has more subacute progression of her dyspnea despite maximal bronchodilator therapy.  She has seen some desaturations over the last month.  I will treat  her for an acute bronchitis with doxycycline  and then get her on scheduled azithromycin  MWF (she had difficulty tolerating every day due to GI side effects).  Continue her current bronchodilator regimen   Please take doxycycline  100 mg twice a day for 7 days until completely gone. When you finish the doxycycline  please start azithromycin  250 mg on Monday, Wednesday, Friday. Continue your Brovana  and ipratropium nebulizer twice a day Continue Breztri  2 puffs twice a day.  Rinse and gargle after using. Continue your albuterol  2 puffs up to every 4 hours if needed for shortness of breath, chest tightness, wheezing. Follow-up in our office in 1 month so we can assess your status.  Chronic respiratory failure (HCC) She is seeing exertional desaturations.  We will repeat her walking oximetry today and titrate her oxygen  accordingly, suspect that she needs to be on more than 2 L/min via her pulsed concentrator.  Pulmonary nodules Pulmonary nodules on CT chest in the setting of her mycobacterial disease.  There has been waxing and waning nodularity on prior scans.  That said there is a lateral right middle lobe nodule that appears to have slowly enlarged.  This may ultimately require a tissue diagnosis.  Plan to repeat her CT scan of the chest 02/2025     Lamar Chris, MD, PhD 10/23/2024, 5:10 PM [facility] [phone] or if no answer [phone]

## 2024-10-23 NOTE — Patient Instructions (Addendum)
 Please take doxycycline  100 mg twice a day for 7 days until completely gone. When you finish the doxycycline  please start azithromycin  250 mg on Monday, Wednesday, Friday. Continue your Brovana  and ipratropium nebulizer twice a day Continue Breztri  2 puffs twice a day.  Rinse and gargle after using. Continue your albuterol  2 puffs up to every 4 hours if needed for shortness of breath, chest tightness, wheezing. We reviewed your CT scan of the chest today.  We will plan to repeat your CT chest in March 2026 We will repeat your walking oximetry today to confirm the flow rate necessary to keep your oxygen  levels at goal. Follow-up in our office in 1 month so we can assess your status.

## 2024-10-23 NOTE — Assessment & Plan Note (Signed)
 She is seeing exertional desaturations.  We will repeat her walking oximetry today and titrate her oxygen  accordingly, suspect that she needs to be on more than 2 L/min via her pulsed concentrator.

## 2024-10-23 NOTE — Assessment & Plan Note (Signed)
 Pulmonary nodules on CT chest in the setting of her mycobacterial disease.  There has been waxing and waning nodularity on prior scans.  That said there is a lateral right middle lobe nodule that appears to have slowly enlarged.  This may ultimately require a tissue diagnosis.  Plan to repeat her CT scan of the chest 02/2025

## 2024-10-23 NOTE — Assessment & Plan Note (Signed)
 Persistent bronchitic symptoms, worse over about 1 month.  She also has more subacute progression of her dyspnea despite maximal bronchodilator therapy.  She has seen some desaturations over the last month.  I will treat her for an acute bronchitis with doxycycline  and then get her on scheduled azithromycin  MWF (she had difficulty tolerating every day due to GI side effects).  Continue her current bronchodilator regimen   Please take doxycycline  100 mg twice a day for 7 days until completely gone. When you finish the doxycycline  please start azithromycin  250 mg on Monday, Wednesday, Friday. Continue your Brovana  and ipratropium nebulizer twice a day Continue Breztri  2 puffs twice a day.  Rinse and gargle after using. Continue your albuterol  2 puffs up to every 4 hours if needed for shortness of breath, chest tightness, wheezing. Follow-up in our office in 1 month so we can assess your status.

## 2024-10-24 ENCOUNTER — Telehealth: Payer: Self-pay | Admitting: Emergency Medicine

## 2024-10-24 DIAGNOSIS — A318 Other mycobacterial infections: Secondary | ICD-10-CM

## 2024-10-24 NOTE — Telephone Encounter (Signed)
 Per Tommye with [facility]- Patient currently has a POC, if you are wanting her tanks instead, we would need a corrected order showing a liter flow and if she can use a conserving device. Thanks.  Please advise.

## 2024-10-25 NOTE — Telephone Encounter (Signed)
 RB has signed this.

## 2024-10-25 NOTE — Telephone Encounter (Signed)
 My understanding is the patient already has tanks but they are empty. She needs more.   I will go ahead and place a new order though specifying this information.

## 2024-10-25 NOTE — Telephone Encounter (Signed)
 Ok. Will send when signed. NFN.

## 2024-11-11 DIAGNOSIS — H43812 Vitreous degeneration, left eye: Secondary | ICD-10-CM | POA: Diagnosis not present

## 2024-11-11 DIAGNOSIS — H04123 Dry eye syndrome of bilateral lacrimal glands: Secondary | ICD-10-CM | POA: Diagnosis not present

## 2024-11-11 DIAGNOSIS — G4733 Obstructive sleep apnea (adult) (pediatric): Secondary | ICD-10-CM | POA: Diagnosis not present

## 2024-11-11 DIAGNOSIS — H26491 Other secondary cataract, right eye: Secondary | ICD-10-CM | POA: Diagnosis not present

## 2024-11-11 DIAGNOSIS — Z961 Presence of intraocular lens: Secondary | ICD-10-CM | POA: Diagnosis not present

## 2024-11-11 DIAGNOSIS — H02403 Unspecified ptosis of bilateral eyelids: Secondary | ICD-10-CM | POA: Diagnosis not present

## 2024-11-13 ENCOUNTER — Encounter: Payer: Self-pay | Admitting: Emergency Medicine

## 2024-11-13 MED ORDER — HYDROCODONE BIT-HOMATROP MBR 5-1.5 MG/5ML PO SOLN: 5.0000 mL | Freq: Four times a day (QID) | ORAL | 0 refills | Status: AC | PRN

## 2024-11-13 NOTE — Telephone Encounter (Signed)
 Prescription for Hycodan sent to her pharmacy

## 2024-11-13 NOTE — Telephone Encounter (Signed)
 Called and spoke with the pt. Pt states she has a deep dry cough & it seems to be worsening. She is unable to sleep due to cough. Pt is requesting cough syrup Preferred pharmacy [facility].  Dr. Shelah please advise

## 2024-12-02 ENCOUNTER — Ambulatory Visit: Admitting: Primary Care

## 2024-12-02 DIAGNOSIS — J9610 Chronic respiratory failure, unspecified whether with hypoxia or hypercapnia: Secondary | ICD-10-CM

## 2024-12-02 DIAGNOSIS — J439 Emphysema, unspecified: Secondary | ICD-10-CM

## 2024-12-17 ENCOUNTER — Other Ambulatory Visit: Payer: Self-pay | Admitting: Family Medicine

## 2024-12-24 ENCOUNTER — Other Ambulatory Visit: Payer: Self-pay | Admitting: Family Medicine

## 2024-12-25 ENCOUNTER — Ambulatory Visit: Admitting: Emergency Medicine

## 2024-12-31 ENCOUNTER — Other Ambulatory Visit: Payer: Self-pay | Admitting: Emergency Medicine

## 2024-12-31 DIAGNOSIS — J438 Other emphysema: Secondary | ICD-10-CM

## 2024-12-31 DIAGNOSIS — R053 Chronic cough: Secondary | ICD-10-CM

## 2024-12-31 NOTE — Telephone Encounter (Signed)
 Copied from CRM [id number]. Topic: Clinical - Medication Refill >> Dec 31, 2024  3:46 PM Whitney O wrote: Medication: arformoterol  (BROVANA ) 15 MCG/2ML NEBU [[id number]]  Has the patient contacted their pharmacy? Yes they said they faxed twice  (Agent: If no, request that the patient contact the pharmacy for the refill. If patient does not wish to contact the pharmacy document the reason why and proceed with request.) (Agent: If yes, when and what did the pharmacy advise?)  This is the patient's preferred pharmacy:  [facility] - [place], [place] - [address] [facility]. [address] [employer]. [address] [place] [address] Phone: [phone] Fax: [phone]  Is this the correct pharmacy for this prescription? Yes If no, delete pharmacy and type the correct one.   Has the prescription been filled recently? No  Is the patient out of the medication? Yes  Has the patient been seen for an appointment in the last year OR does the patient have an upcoming appointment? Yes 01/14/2025  Can we respond through MyChart? Yes  Agent: Please be advised that Rx refills may take up to 3 business days. We ask that you follow-up with your pharmacy.

## 2025-01-01 MED ORDER — ARFORMOTEROL TARTRATE 15 MCG/2ML IN NEBU: 15.0000 ug | INHALATION_SOLUTION | Freq: Two times a day (BID) | RESPIRATORY_TRACT | 11 refills | Status: DC

## 2025-01-03 ENCOUNTER — Telehealth: Payer: Self-pay

## 2025-01-03 MED ORDER — ARFORMOTEROL TARTRATE 15 MCG/2ML IN NEBU: 15.0000 ug | INHALATION_SOLUTION | Freq: Two times a day (BID) | RESPIRATORY_TRACT | 6 refills | Status: DC

## 2025-01-03 MED ORDER — ARFORMOTEROL TARTRATE 15 MCG/2ML IN NEBU: 15.0000 ug | INHALATION_SOLUTION | Freq: Two times a day (BID) | RESPIRATORY_TRACT | 6 refills | Status: AC

## 2025-01-03 NOTE — Telephone Encounter (Signed)
 Copied from CRM #[id number]. Topic: Clinical - Prescription Issue >> Jan 03, 2025  9:08 AM Isabell A wrote: Reason for CRM: Patient states arformoterol  (BROVANA ) 15 MCG/2ML NEBU [[id number]] is supposed to go to [facility].   Callback number: [phone]  Pharmacy [facility] - [place], [place] - [address] [facility]. [address] [employer]. [address], [place] [address] Phone: [phone]  Fax: [phone]    Rx refilled through [facility].

## 2025-01-03 NOTE — Telephone Encounter (Signed)
 Brovana  neb refilled.

## 2025-01-14 ENCOUNTER — Ambulatory Visit: Admitting: Primary Care

## 2025-03-04 ENCOUNTER — Other Ambulatory Visit

## 2025-03-19 ENCOUNTER — Ambulatory Visit: Admitting: Emergency Medicine
